# Patient Record
Sex: Male | Born: 1945
Health system: Southern US, Community
[De-identification: ages and names within clinical notes are randomized; demographics above are authoritative.]

## PROBLEM LIST (undated history)

## (undated) DIAGNOSIS — I10 Essential (primary) hypertension: Secondary | ICD-10-CM

## (undated) DIAGNOSIS — N189 Chronic kidney disease, unspecified: Secondary | ICD-10-CM

## (undated) DIAGNOSIS — E785 Hyperlipidemia, unspecified: Secondary | ICD-10-CM

## (undated) DIAGNOSIS — E13319 Other specified diabetes mellitus with unspecified diabetic retinopathy without macular edema: Secondary | ICD-10-CM

## (undated) HISTORY — DX: Chronic kidney disease, unspecified: N18.9

## (undated) HISTORY — DX: Essential (primary) hypertension: I10

## (undated) HISTORY — DX: Hyperlipidemia, unspecified: E78.5

## (undated) HISTORY — DX: Other specified diabetes mellitus with unspecified diabetic retinopathy without macular edema: E13.319

---

## 2005-05-29 ENCOUNTER — Ambulatory Visit: Payer: Self-pay | Admitting: Internal Medicine

## 2008-11-20 ENCOUNTER — Ambulatory Visit: Payer: Self-pay | Admitting: Nephrology

## 2014-09-04 ENCOUNTER — Encounter (INDEPENDENT_AMBULATORY_CARE_PROVIDER_SITE_OTHER): Payer: Self-pay | Admitting: Ophthalmology

## 2014-09-12 ENCOUNTER — Encounter (INDEPENDENT_AMBULATORY_CARE_PROVIDER_SITE_OTHER): Payer: Medicare Other | Admitting: Ophthalmology

## 2014-09-12 DIAGNOSIS — H35033 Hypertensive retinopathy, bilateral: Secondary | ICD-10-CM

## 2014-09-12 DIAGNOSIS — E11339 Type 2 diabetes mellitus with moderate nonproliferative diabetic retinopathy without macular edema: Secondary | ICD-10-CM

## 2014-09-12 DIAGNOSIS — I10 Essential (primary) hypertension: Secondary | ICD-10-CM

## 2014-09-12 DIAGNOSIS — E11331 Type 2 diabetes mellitus with moderate nonproliferative diabetic retinopathy with macular edema: Secondary | ICD-10-CM

## 2014-09-12 DIAGNOSIS — H43813 Vitreous degeneration, bilateral: Secondary | ICD-10-CM

## 2014-09-12 DIAGNOSIS — E11311 Type 2 diabetes mellitus with unspecified diabetic retinopathy with macular edema: Secondary | ICD-10-CM

## 2014-10-31 DIAGNOSIS — M1812 Unilateral primary osteoarthritis of first carpometacarpal joint, left hand: Secondary | ICD-10-CM | POA: Diagnosis not present

## 2014-11-07 ENCOUNTER — Ambulatory Visit: Payer: Self-pay | Admitting: Unknown Physician Specialty

## 2014-11-07 DIAGNOSIS — Z01810 Encounter for preprocedural cardiovascular examination: Secondary | ICD-10-CM | POA: Diagnosis not present

## 2014-11-07 DIAGNOSIS — E119 Type 2 diabetes mellitus without complications: Secondary | ICD-10-CM | POA: Diagnosis not present

## 2014-11-07 DIAGNOSIS — Z01812 Encounter for preprocedural laboratory examination: Secondary | ICD-10-CM | POA: Diagnosis not present

## 2014-11-07 DIAGNOSIS — I10 Essential (primary) hypertension: Secondary | ICD-10-CM | POA: Diagnosis not present

## 2014-11-10 DIAGNOSIS — R0789 Other chest pain: Secondary | ICD-10-CM | POA: Diagnosis not present

## 2014-11-10 DIAGNOSIS — Z01811 Encounter for preprocedural respiratory examination: Secondary | ICD-10-CM | POA: Diagnosis not present

## 2014-11-14 ENCOUNTER — Ambulatory Visit: Payer: Self-pay | Admitting: Specialist

## 2014-11-14 DIAGNOSIS — M1990 Unspecified osteoarthritis, unspecified site: Secondary | ICD-10-CM | POA: Diagnosis not present

## 2014-11-14 DIAGNOSIS — M1812 Unilateral primary osteoarthritis of first carpometacarpal joint, left hand: Secondary | ICD-10-CM | POA: Diagnosis not present

## 2014-11-14 DIAGNOSIS — I10 Essential (primary) hypertension: Secondary | ICD-10-CM | POA: Diagnosis not present

## 2014-11-14 DIAGNOSIS — Z91048 Other nonmedicinal substance allergy status: Secondary | ICD-10-CM | POA: Diagnosis not present

## 2014-11-14 DIAGNOSIS — E119 Type 2 diabetes mellitus without complications: Secondary | ICD-10-CM | POA: Diagnosis not present

## 2014-11-14 DIAGNOSIS — M19042 Primary osteoarthritis, left hand: Secondary | ICD-10-CM | POA: Diagnosis not present

## 2014-11-14 DIAGNOSIS — N289 Disorder of kidney and ureter, unspecified: Secondary | ICD-10-CM | POA: Diagnosis not present

## 2014-11-14 HISTORY — PX: OTHER SURGICAL HISTORY: SHX169

## 2014-11-21 DIAGNOSIS — M1812 Unilateral primary osteoarthritis of first carpometacarpal joint, left hand: Secondary | ICD-10-CM | POA: Diagnosis not present

## 2014-12-07 DIAGNOSIS — E1122 Type 2 diabetes mellitus with diabetic chronic kidney disease: Secondary | ICD-10-CM | POA: Diagnosis not present

## 2014-12-07 DIAGNOSIS — I129 Hypertensive chronic kidney disease with stage 1 through stage 4 chronic kidney disease, or unspecified chronic kidney disease: Secondary | ICD-10-CM | POA: Diagnosis not present

## 2014-12-07 DIAGNOSIS — I10 Essential (primary) hypertension: Secondary | ICD-10-CM | POA: Diagnosis not present

## 2014-12-07 DIAGNOSIS — E782 Mixed hyperlipidemia: Secondary | ICD-10-CM | POA: Diagnosis not present

## 2014-12-14 DIAGNOSIS — M1812 Unilateral primary osteoarthritis of first carpometacarpal joint, left hand: Secondary | ICD-10-CM | POA: Diagnosis not present

## 2014-12-18 ENCOUNTER — Encounter (INDEPENDENT_AMBULATORY_CARE_PROVIDER_SITE_OTHER): Payer: Medicare Other | Admitting: Ophthalmology

## 2015-01-14 NOTE — Op Note (Signed)
PATIENT NAME:  Jack Estrada, Jack Estrada MR#:  [id number] DATE OF BIRTH:  09-09-1946  DATE OF PROCEDURE:  11/14/2014  PREOPERATIVE DIAGNOSIS: Severe degenerative arthritis, carpometacarpal joint, base of left thumb.   POSTOPERATIVE DIAGNOSIS: Severe degenerative arthritis, carpometacarpal joint, base of left thumb.   PROCEDURE PERFORMED: Excisional trapezium arthroplasty, left thumb.   SURGEON: Christophe Louis, M.D.   ANESTHESIA: General.   COMPLICATIONS: None.   TOURNIQUET TIME: 58 minutes.   DESCRIPTION OF PROCEDURE: 2 grams of Ancef was given intravenously prior to the procedure. General anesthesia is induced. The left upper extremity is thoroughly prepped with alcohol and ChloraPrep and draped in standard sterile fashion. The extremity is wrapped out with the Esmarch bandage and pneumatic tourniquet elevated to 250 mmHg. Median nerve block is first performed at the wrist using 0.5% plain Marcaine. Under loupe magnification, a standard dorsal incision is then made over the carpometacarpal joint at the base of the left thumb. The dissection is carefully carried down with preservation of the tendons and cutaneous nerves. The dorsal capsule is carefully dissected out and a longitudinal incision is made in the dorsal capsule and it is carefully reflected to each side for later repair. The trapezium was dissected out and then is completely excised using the bur and the rongeur. Careful palpation demonstrates no residual pieces of bone. Three small pieces of Gelfoam are then fashioned then compressed into the shape of a trapezium and sewn together using 4-0 Mersilene. This was then entered into the hole left by the excision of the trapezium and secured at the base of the wound by the 4-0 Mersilene through a suture through the flexor tendon. Subcutaneous tissue was then infiltrated with 0.5% plain Marcaine. Dorsal capsule is then meticulously repaired using 4-0 Mersilene. One 5-0 Vicryl suture is placed and then  a running subcuticular 3-0 Prolene for the skin. Soft bulky dressing with a thumb spica splint is applied and the tourniquet is released. The patient is returned to the recovery room having tolerated the procedure quite well.   ____________________________ Lucas Mallow, MD ces:mc D: 11/15/2014 08:06:55 ET T: 11/15/2014 10:38:49 ET JOB#: [id number]  cc: Lucas Mallow, MD, <Dictator> Lucas Mallow MD ELECTRONICALLY SIGNED 11/25/2014 10:06

## 2015-02-16 ENCOUNTER — Telehealth: Payer: Self-pay

## 2015-02-16 DIAGNOSIS — I10 Essential (primary) hypertension: Secondary | ICD-10-CM

## 2015-02-16 DIAGNOSIS — E785 Hyperlipidemia, unspecified: Secondary | ICD-10-CM

## 2015-02-16 MED ORDER — LOSARTAN POTASSIUM 100 MG PO TABS: 100.0000 mg | ORAL_TABLET | Freq: Every day | ORAL | Status: DC

## 2015-02-16 MED ORDER — ATORVASTATIN CALCIUM 40 MG PO TABS: 40.0000 mg | ORAL_TABLET | Freq: Every day | ORAL | Status: DC

## 2015-02-16 NOTE — Telephone Encounter (Signed)
meds done

## 2015-02-16 NOTE — Telephone Encounter (Signed)
[facility] is requesting 90 day Rx for Atorvastatin 40mg  Tab And Losartan Pot 100mg 

## 2015-02-23 ENCOUNTER — Telehealth: Payer: Self-pay | Admitting: Family Medicine

## 2015-02-23 ENCOUNTER — Telehealth: Payer: Self-pay

## 2015-02-23 NOTE — Telephone Encounter (Signed)
Left message for patient to call back so that I can discuss his Lantus dose with him.

## 2015-02-23 NOTE — Telephone Encounter (Signed)
Pt called stated pharmacy is not giving pt the correct dosage of Lantus. Pt states he needs a RX for 5 pens per month but pharmacy is only willing to give 3 pens at this point. 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 Thanks

## 2015-02-23 NOTE — Telephone Encounter (Signed)
Pharmacy called, they do not have a new prescription for him for the Lantus. Therapist, nutritional, he had a current one on file, so I gave a verbal over the phone.

## 2015-02-23 NOTE — Telephone Encounter (Signed)
Patient called back, he was concerned that there was not enough Lantus prescribed. I checked the prescription in [facility], there is enough to get him through a month.

## 2015-05-08 ENCOUNTER — Other Ambulatory Visit: Payer: Self-pay | Admitting: Family Medicine

## 2015-05-10 DIAGNOSIS — M1812 Unilateral primary osteoarthritis of first carpometacarpal joint, left hand: Secondary | ICD-10-CM | POA: Diagnosis not present

## 2015-06-20 ENCOUNTER — Telehealth: Payer: Self-pay | Admitting: Family Medicine

## 2015-06-20 NOTE — Telephone Encounter (Signed)
Pt called wanted to know if we have samples of Lantus. Pt stated he is in a donut hole and can't afford $800 for the RX. Please call pt. Thanks.

## 2015-07-06 DIAGNOSIS — E113299 Type 2 diabetes mellitus with mild nonproliferative diabetic retinopathy without macular edema, unspecified eye: Secondary | ICD-10-CM | POA: Insufficient documentation

## 2015-07-06 DIAGNOSIS — E119 Type 2 diabetes mellitus without complications: Secondary | ICD-10-CM | POA: Insufficient documentation

## 2015-07-06 DIAGNOSIS — E1169 Type 2 diabetes mellitus with other specified complication: Secondary | ICD-10-CM | POA: Insufficient documentation

## 2015-07-06 DIAGNOSIS — E13319 Other specified diabetes mellitus with unspecified diabetic retinopathy without macular edema: Secondary | ICD-10-CM | POA: Insufficient documentation

## 2015-07-06 DIAGNOSIS — E785 Hyperlipidemia, unspecified: Secondary | ICD-10-CM | POA: Insufficient documentation

## 2015-07-06 DIAGNOSIS — Z794 Long term (current) use of insulin: Secondary | ICD-10-CM | POA: Insufficient documentation

## 2015-07-09 ENCOUNTER — Encounter: Payer: Self-pay | Admitting: Family Medicine

## 2015-07-09 ENCOUNTER — Telehealth: Payer: Self-pay

## 2015-07-09 ENCOUNTER — Ambulatory Visit (INDEPENDENT_AMBULATORY_CARE_PROVIDER_SITE_OTHER): Payer: Medicare Other | Admitting: Family Medicine

## 2015-07-09 VITALS — BP 119/74 | HR 67 | Temp 97.2°F | Ht 71.5 in | Wt 244.0 lb

## 2015-07-09 DIAGNOSIS — E113299 Type 2 diabetes mellitus with mild nonproliferative diabetic retinopathy without macular edema, unspecified eye: Secondary | ICD-10-CM

## 2015-07-09 DIAGNOSIS — Z23 Encounter for immunization: Secondary | ICD-10-CM

## 2015-07-09 DIAGNOSIS — I10 Essential (primary) hypertension: Secondary | ICD-10-CM | POA: Insufficient documentation

## 2015-07-09 DIAGNOSIS — Z0000 Encounter for general adult medical examination without abnormal findings: Secondary | ICD-10-CM

## 2015-07-09 DIAGNOSIS — E119 Type 2 diabetes mellitus without complications: Secondary | ICD-10-CM | POA: Diagnosis not present

## 2015-07-09 DIAGNOSIS — N400 Enlarged prostate without lower urinary tract symptoms: Secondary | ICD-10-CM | POA: Diagnosis not present

## 2015-07-09 DIAGNOSIS — Z794 Long term (current) use of insulin: Secondary | ICD-10-CM

## 2015-07-09 DIAGNOSIS — E785 Hyperlipidemia, unspecified: Secondary | ICD-10-CM | POA: Diagnosis not present

## 2015-07-09 DIAGNOSIS — E1159 Type 2 diabetes mellitus with other circulatory complications: Secondary | ICD-10-CM | POA: Insufficient documentation

## 2015-07-09 DIAGNOSIS — Z1211 Encounter for screening for malignant neoplasm of colon: Secondary | ICD-10-CM

## 2015-07-09 LAB — URINALYSIS, ROUTINE W REFLEX MICROSCOPIC
BILIRUBIN UA: NEGATIVE
GLUCOSE, UA: NEGATIVE
Leukocytes, UA: NEGATIVE
NITRITE UA: NEGATIVE
RBC UA: NEGATIVE
SPEC GRAV UA: 1.025 (ref 1.005–1.030)
UUROB: 1 mg/dL (ref 0.2–1.0)
pH, UA: 5.5 (ref 5.0–7.5)

## 2015-07-09 LAB — BAYER DCA HB A1C WAIVED: HB A1C: 8.6 % — AB (ref <7.0)

## 2015-07-09 LAB — MICROSCOPIC EXAMINATION
Epithelial Cells (non renal): NONE SEEN /hpf (ref 0–10)
RBC, UA: NONE SEEN /hpf (ref 0–?)
WBC, UA: NONE SEEN /hpf (ref 0–?)

## 2015-07-09 MED ORDER — LOSARTAN POTASSIUM 100 MG PO TABS: 100.0000 mg | ORAL_TABLET | Freq: Every day | ORAL | Status: DC

## 2015-07-09 MED ORDER — INSULIN LISPRO 100 UNIT/ML (KWIKPEN): 20.0000 [IU] | PEN_INJECTOR | Freq: Three times a day (TID) | SUBCUTANEOUS | Status: DC

## 2015-07-09 MED ORDER — AMLODIPINE BESYLATE 10 MG PO TABS: 10.0000 mg | ORAL_TABLET | Freq: Every day | ORAL | Status: DC

## 2015-07-09 MED ORDER — ATORVASTATIN CALCIUM 40 MG PO TABS: 40.0000 mg | ORAL_TABLET | Freq: Every day | ORAL | Status: DC

## 2015-07-09 MED ORDER — INSULIN GLARGINE 100 UNIT/ML SOLOSTAR PEN: 50.0000 [IU] | PEN_INJECTOR | Freq: Every morning | SUBCUTANEOUS | Status: DC

## 2015-07-09 NOTE — Telephone Encounter (Signed)
Pharmacy notified to cancel amlodipine, lipitor, and losartan.

## 2015-07-09 NOTE — Assessment & Plan Note (Signed)
The current medical regimen is effective;  continue present plan and medications.  

## 2015-07-09 NOTE — Progress Notes (Signed)
BP 119/74 mmHg  Pulse 67  Temp(Src) 97.2 F (36.2 C)  Ht 5' 11.5" (1.816 m)  Wt 244 lb (110.678 kg)  BMI 33.56 kg/m2  SpO2 98%   Subjective:    Patient ID: Jack Estrada, male    DOB: 09/11/1946, 69 y.o.   MRN: [id number]  HPI: Jack Estrada is a 69 y.o. male  Chief Complaint  Patient presents with  . Annual Exam   patient recheck blood pressures doing well with no complaints from medications same with cholesterol medicines no complaints taking faithfully. Insulin has run out of Lantus and Humalog has been cutting back on dose the last 2 weeks is entering the doughnut hole. Sugars been okay on home monitoring Otherwise doing well no complaints annual wellness visit Metrix met  Relevant past medical, surgical, family and social history reviewed and updated as indicated. Interim medical history since our last visit reviewed. Allergies and medications reviewed and updated.  Review of Systems  Constitutional: Negative.   HENT: Negative.   Eyes: Negative.   Respiratory: Negative.   Cardiovascular: Negative.   Gastrointestinal: Negative.   Endocrine: Negative.   Genitourinary: Negative.   Musculoskeletal: Negative.   Skin: Negative.   Allergic/Immunologic: Negative.   Neurological: Negative.   Hematological: Negative.   Psychiatric/Behavioral: Negative.     Per HPI unless specifically indicated above     Objective:    BP 119/74 mmHg  Pulse 67  Temp(Src) 97.2 F (36.2 C)  Ht 5' 11.5" (1.816 m)  Wt 244 lb (110.678 kg)  BMI 33.56 kg/m2  SpO2 98%  Wt Readings from Last 3 Encounters:  07/09/15 244 lb (110.678 kg)  12/07/14 248 lb (112.492 kg)    Physical Exam  Constitutional: He is oriented to person, place, and time. He appears well-developed and well-nourished.  HENT:  Head: Normocephalic and atraumatic.  Right Ear: External ear normal.  Left Ear: External ear normal.  Eyes: Conjunctivae and EOM are normal. Pupils are equal, round, and reactive to light.   Neck: Normal range of motion. Neck supple.  Cardiovascular: Normal rate, regular rhythm, normal heart sounds and intact distal pulses.   Pulmonary/Chest: Effort normal and breath sounds normal.  Abdominal: Soft. Bowel sounds are normal. There is no splenomegaly or hepatomegaly.  Genitourinary: Rectum normal and penis normal.  Prostate enlarged  Musculoskeletal: Normal range of motion.  Neurological: He is alert and oriented to person, place, and time. He has normal reflexes.  Skin: No rash noted. No erythema.  Psychiatric: He has a normal mood and affect. His behavior is normal. Judgment and thought content normal.    No results found for this or any previous visit.    Assessment & Plan:   Problem List Items Addressed This Visit      Cardiovascular and Mediastinum   Essential hypertension    The current medical regimen is effective;  continue present plan and medications.       Relevant Medications   amLODipine (NORVASC) 10 MG tablet   atorvastatin (LIPITOR) 40 MG tablet   losartan (COZAAR) 100 MG tablet   Other Relevant Orders   Comprehensive metabolic panel   CBC with Differential/Platelet   Urinalysis, Routine w reflex microscopic (not at [facility])   TSH     Endocrine   Type 2 DM mild nonproliferative retinopathy, no macular edema, control (HCC)    Discussed risk of uncontrolled diabetes and aging and dog years. Discuss use of insulin using samples is available and other medications.  Relevant Medications   atorvastatin (LIPITOR) 40 MG tablet   Insulin Glargine (LANTUS SOLOSTAR) 100 UNIT/ML Solostar Pen   insulin lispro (HUMALOG KWIKPEN) 100 UNIT/ML KiwkPen   losartan (COZAAR) 100 MG tablet     Genitourinary   BPH (benign prostatic hyperplasia)   Relevant Orders   PSA   TSH     Other   Hyperlipidemia    The current medical regimen is effective;  continue present plan and medications.       Relevant Medications   amLODipine (NORVASC) 10 MG tablet    atorvastatin (LIPITOR) 40 MG tablet   losartan (COZAAR) 100 MG tablet   Other Relevant Orders   Comprehensive metabolic panel   Lipid panel   CBC with Differential/Platelet   Urinalysis, Routine w reflex microscopic (not at [facility])   TSH    Other Visit Diagnoses    Immunization due    -  Primary    Relevant Orders    Flu Vaccine QUAD 36+ mos PF IM (Fluarix & Fluzone Quad PF) (Completed)    Colon cancer screening        Relevant Orders    Ambulatory referral to General Surgery    PE (physical exam), annual        Benign hypertension        Relevant Medications    amLODipine (NORVASC) 10 MG tablet    atorvastatin (LIPITOR) 40 MG tablet    losartan (COZAAR) 100 MG tablet        Follow up plan: Return in about 3 months (around 10/09/2015), or if symptoms worsen or fail to improve, for Recheck diabetes and hemoglobin A1c.

## 2015-07-09 NOTE — Assessment & Plan Note (Signed)
Discussed risk of uncontrolled diabetes and aging and dog years. Discuss use of insulin using samples is available and other medications.

## 2015-07-10 ENCOUNTER — Encounter: Payer: Self-pay | Admitting: Family Medicine

## 2015-07-10 LAB — CBC WITH DIFFERENTIAL/PLATELET
Basophils Absolute: 0 10*3/uL (ref 0.0–0.2)
Basos: 1 %
EOS (ABSOLUTE): 0.2 10*3/uL (ref 0.0–0.4)
EOS: 4 %
HEMATOCRIT: 38.9 % (ref 37.5–51.0)
HEMOGLOBIN: 12.7 g/dL (ref 12.6–17.7)
Immature Grans (Abs): 0 10*3/uL (ref 0.0–0.1)
Immature Granulocytes: 0 %
LYMPHS ABS: 2.1 10*3/uL (ref 0.7–3.1)
Lymphs: 34 %
MCH: 29.1 pg (ref 26.6–33.0)
MCHC: 32.6 g/dL (ref 31.5–35.7)
MCV: 89 fL (ref 79–97)
MONOCYTES: 8 %
Monocytes Absolute: 0.5 10*3/uL (ref 0.1–0.9)
NEUTROS ABS: 3.3 10*3/uL (ref 1.4–7.0)
Neutrophils: 53 %
Platelets: 174 10*3/uL (ref 150–379)
RBC: 4.36 x10E6/uL (ref 4.14–5.80)
RDW: 13.7 % (ref 12.3–15.4)
WBC: 6.2 10*3/uL (ref 3.4–10.8)

## 2015-07-10 LAB — TSH: TSH: 2.5 u[IU]/mL (ref 0.450–4.500)

## 2015-07-10 LAB — COMPREHENSIVE METABOLIC PANEL
A/G RATIO: 1.5 (ref 1.1–2.5)
ALK PHOS: 85 IU/L (ref 39–117)
ALT: 31 IU/L (ref 0–44)
AST: 20 IU/L (ref 0–40)
Albumin: 4.1 g/dL (ref 3.6–4.8)
BILIRUBIN TOTAL: 0.3 mg/dL (ref 0.0–1.2)
BUN/Creatinine Ratio: 18 (ref 10–22)
BUN: 24 mg/dL (ref 8–27)
CHLORIDE: 103 mmol/L (ref 97–106)
CO2: 25 mmol/L (ref 18–29)
Calcium: 9.6 mg/dL (ref 8.6–10.2)
Creatinine, Ser: 1.3 mg/dL — ABNORMAL HIGH (ref 0.76–1.27)
GFR calc Af Amer: 65 mL/min/{1.73_m2} (ref >59)
GFR calc non Af Amer: 56 mL/min/{1.73_m2} — ABNORMAL LOW (ref >59)
GLUCOSE: 107 mg/dL — AB (ref 65–99)
Globulin, Total: 2.8 g/dL (ref 1.5–4.5)
POTASSIUM: 4.5 mmol/L (ref 3.5–5.2)
Sodium: 142 mmol/L (ref 136–144)
Total Protein: 6.9 g/dL (ref 6.0–8.5)

## 2015-07-10 LAB — LIPID PANEL
CHOL/HDL RATIO: 4.8 ratio (ref 0.0–5.0)
CHOLESTEROL TOTAL: 163 mg/dL (ref 100–199)
HDL: 34 mg/dL — AB (ref >39)
LDL Calculated: 88 mg/dL (ref 0–99)
Triglycerides: 204 mg/dL — ABNORMAL HIGH (ref 0–149)
VLDL Cholesterol Cal: 41 mg/dL — ABNORMAL HIGH (ref 5–40)

## 2015-07-10 LAB — PSA: PROSTATE SPECIFIC AG, SERUM: 0.5 ng/mL (ref 0.0–4.0)

## 2015-08-22 DIAGNOSIS — E113293 Type 2 diabetes mellitus with mild nonproliferative diabetic retinopathy without macular edema, bilateral: Secondary | ICD-10-CM | POA: Diagnosis not present

## 2015-08-22 LAB — HM DIABETES EYE EXAM

## 2015-09-04 ENCOUNTER — Telehealth: Payer: Self-pay | Admitting: Family Medicine

## 2015-09-04 NOTE — Telephone Encounter (Signed)
Pt notified   thanks

## 2015-09-04 NOTE — Telephone Encounter (Signed)
Pt would like to know if he could poss have humalog and possibly lantus. He stated once he got to January he should be ok.

## 2015-09-04 NOTE — Telephone Encounter (Signed)
Please call patient, samples are in the fridge

## 2015-09-14 ENCOUNTER — Other Ambulatory Visit: Payer: Self-pay | Admitting: Family Medicine

## 2015-09-14 ENCOUNTER — Telehealth: Payer: Self-pay | Admitting: Family Medicine

## 2015-09-14 NOTE — Telephone Encounter (Signed)
Sample with patient's name in refridgerator

## 2015-09-14 NOTE — Telephone Encounter (Signed)
Pt called wants to know if he can have one more sample of Humalog. Pt stated he needs one more to get him through the donut hole with his insurance. Thanks.

## 2015-10-15 ENCOUNTER — Ambulatory Visit: Payer: Medicare Other | Admitting: Family Medicine

## 2015-10-15 ENCOUNTER — Encounter: Payer: Self-pay | Admitting: Family Medicine

## 2015-10-16 ENCOUNTER — Other Ambulatory Visit: Payer: Self-pay | Admitting: Family Medicine

## 2015-10-16 NOTE — Telephone Encounter (Signed)
Your patient 

## 2015-11-05 ENCOUNTER — Ambulatory Visit: Payer: Medicare Other | Admitting: Family Medicine

## 2015-11-08 ENCOUNTER — Telehealth: Payer: Self-pay | Admitting: Family Medicine

## 2015-11-08 MED ORDER — TADALAFIL 20 MG PO TABS: 10.0000 mg | ORAL_TABLET | ORAL | Status: DC | PRN

## 2015-11-08 NOTE — Telephone Encounter (Signed)
Call pt Patient needs refill on his medication Cialis send to [facility]. Please call patient when it is done, thanks [phone].

## 2015-11-08 NOTE — Telephone Encounter (Signed)
[place] [address]  Patient needs refill on his medication Cialis send to [facility]. Please call patient when it is done, thanks [phone].

## 2015-11-15 ENCOUNTER — Ambulatory Visit (INDEPENDENT_AMBULATORY_CARE_PROVIDER_SITE_OTHER): Payer: Medicare Other | Admitting: Family Medicine

## 2015-11-15 ENCOUNTER — Encounter: Payer: Self-pay | Admitting: Family Medicine

## 2015-11-15 VITALS — BP 138/74 | HR 61 | Temp 97.8°F | Ht 72.3 in | Wt 244.0 lb

## 2015-11-15 DIAGNOSIS — I10 Essential (primary) hypertension: Secondary | ICD-10-CM | POA: Diagnosis not present

## 2015-11-15 DIAGNOSIS — Z794 Long term (current) use of insulin: Secondary | ICD-10-CM | POA: Diagnosis not present

## 2015-11-15 DIAGNOSIS — E113299 Type 2 diabetes mellitus with mild nonproliferative diabetic retinopathy without macular edema, unspecified eye: Secondary | ICD-10-CM

## 2015-11-15 LAB — BAYER DCA HB A1C WAIVED: HB A1C: 7.4 % — AB (ref <7.0)

## 2015-11-15 MED ORDER — AMLODIPINE BESYLATE 10 MG PO TABS: 10.0000 mg | ORAL_TABLET | Freq: Every day | ORAL | Status: DC

## 2015-11-15 MED ORDER — ATORVASTATIN CALCIUM 40 MG PO TABS: 40.0000 mg | ORAL_TABLET | Freq: Every day | ORAL | Status: DC

## 2015-11-15 NOTE — Assessment & Plan Note (Signed)
Diabetes much improved we will continue good work and working at getting hemoglobin A1c less than 7.

## 2015-11-15 NOTE — Assessment & Plan Note (Signed)
The current medical regimen is effective;  continue present plan and medications.  

## 2015-11-15 NOTE — Progress Notes (Signed)
BP 138/74 mmHg  Pulse 61  Temp(Src) 97.8 F (36.6 C)  Ht 6' 0.3" (1.836 m)  Wt 244 lb (110.678 kg)  BMI 32.83 kg/m2  SpO2 97%   Subjective:    Patient ID: [name], male    DOB: Aug 28, 1946, 70 y.o.   MRN: [id number]  HPI: CARLOS UPADHYAYA is a 70 y.o. male  Chief Complaint  Patient presents with  . Diabetes   patient's been working very hard to get blood sugar down which is dropped 1.. Patient doing well with other medications no complaints blood pressure good control cholesterol doing well Doing well with insulin with no low blood sugar spells. Taking medications faithfully without side effects  Relevant past medical, surgical, family and social history reviewed and updated as indicated. Interim medical history since our last visit reviewed. Allergies and medications reviewed and updated.  Review of Systems  Constitutional: Negative.   Respiratory: Negative.   Cardiovascular: Negative.     Per HPI unless specifically indicated above     Objective:    BP 138/74 mmHg  Pulse 61  Temp(Src) 97.8 F (36.6 C)  Ht 6' 0.3" (1.836 m)  Wt 244 lb (110.678 kg)  BMI 32.83 kg/m2  SpO2 97%  Wt Readings from Last 3 Encounters:  11/15/15 244 lb (110.678 kg)  07/09/15 244 lb (110.678 kg)  12/07/14 248 lb (112.492 kg)    Physical Exam  Constitutional: He is oriented to person, place, and time. He appears well-developed and well-nourished. No distress.  HENT:  Head: Normocephalic and atraumatic.  Right Ear: Hearing normal.  Left Ear: Hearing normal.  Nose: Nose normal.  Eyes: Conjunctivae and lids are normal. Right eye exhibits no discharge. Left eye exhibits no discharge. No scleral icterus.  Cardiovascular: Normal rate, regular rhythm and normal heart sounds.   Pulmonary/Chest: Effort normal and breath sounds normal. No respiratory distress.  Musculoskeletal: Normal range of motion.  Neurological: He is alert and oriented to person, place, and time.  Skin: Skin is  intact. No rash noted.  Psychiatric: He has a normal mood and affect. His speech is normal and behavior is normal. Judgment and thought content normal. Cognition and memory are normal.    Results for orders placed or performed in visit on 07/09/15  Microscopic Examination  Result Value Ref Range   WBC, UA None seen 0 -  5 /hpf   RBC, UA None seen 0 -  2 /hpf   Epithelial Cells (non renal) None seen 0 - 10 /hpf  Comprehensive metabolic panel  Result Value Ref Range   Glucose 107 (H) 65 - 99 mg/dL   BUN 24 8 - 27 mg/dL   Creatinine, Ser 1.30 (H) 0.76 - 1.27 mg/dL   GFR calc non Af Amer 56 (L) >59 mL/min/1.73   GFR calc Af Amer 65 >59 mL/min/1.73   BUN/Creatinine Ratio 18 10 - 22   Sodium 142 136 - 144 mmol/L   Potassium 4.5 3.5 - 5.2 mmol/L   Chloride 103 97 - 106 mmol/L   CO2 25 18 - 29 mmol/L   Calcium 9.6 8.6 - 10.2 mg/dL   Total Protein 6.9 6.0 - 8.5 g/dL   Albumin 4.1 3.6 - 4.8 g/dL   Globulin, Total 2.8 1.5 - 4.5 g/dL   Albumin/Globulin Ratio 1.5 1.1 - 2.5   Bilirubin Total 0.3 0.0 - 1.2 mg/dL   Alkaline Phosphatase 85 39 - 117 IU/L   AST 20 0 - 40 IU/L   ALT  31 0 - 44 IU/L  Lipid panel  Result Value Ref Range   Cholesterol, Total 163 100 - 199 mg/dL   Triglycerides 204 (H) 0 - 149 mg/dL   HDL 34 (L) >39 mg/dL   VLDL Cholesterol Cal 41 (H) 5 - 40 mg/dL   LDL Calculated 88 0 - 99 mg/dL   Chol/HDL Ratio 4.8 0.0 - 5.0 ratio units  CBC with Differential/Platelet  Result Value Ref Range   WBC 6.2 3.4 - 10.8 x10E3/uL   RBC 4.36 4.14 - 5.80 x10E6/uL   Hemoglobin 12.7 12.6 - 17.7 g/dL   Hematocrit 38.9 37.5 - 51.0 %   MCV 89 79 - 97 fL   MCH 29.1 26.6 - 33.0 pg   MCHC 32.6 31.5 - 35.7 g/dL   RDW 13.7 12.3 - 15.4 %   Platelets 174 150 - 379 x10E3/uL   Neutrophils 53 %   Lymphs 34 %   Monocytes 8 %   Eos 4 %   Basos 1 %   Neutrophils Absolute 3.3 1.4 - 7.0 x10E3/uL   Lymphocytes Absolute 2.1 0.7 - 3.1 x10E3/uL   Monocytes Absolute 0.5 0.1 - 0.9 x10E3/uL   EOS  (ABSOLUTE) 0.2 0.0 - 0.4 x10E3/uL   Basophils Absolute 0.0 0.0 - 0.2 x10E3/uL   Immature Granulocytes 0 %   Immature Grans (Abs) 0.0 0.0 - 0.1 x10E3/uL  PSA  Result Value Ref Range   Prostate Specific Ag, Serum 0.5 0.0 - 4.0 ng/mL  Urinalysis, Routine w reflex microscopic (not at [facility])  Result Value Ref Range   Specific Gravity, UA 1.025 1.005 - 1.030   pH, UA 5.5 5.0 - 7.5   Color, UA Yellow Yellow   Appearance Ur Clear Clear   Leukocytes, UA Negative Negative   Protein, UA Trace (A) Negative/Trace   Glucose, UA Negative Negative   Ketones, UA Trace (A) Negative   RBC, UA Negative Negative   Bilirubin, UA Negative Negative   Urobilinogen, Ur 1.0 0.2 - 1.0 mg/dL   Nitrite, UA Negative Negative   Microscopic Examination See below:   TSH  Result Value Ref Range   TSH 2.500 0.450 - 4.500 uIU/mL  Bayer DCA Hb A1c Waived  Result Value Ref Range   Bayer DCA Hb A1c Waived 8.6 (H) <7.0 %      Assessment & Plan:   Problem List Items Addressed This Visit      Cardiovascular and Mediastinum   Essential hypertension    The current medical regimen is effective;  continue present plan and medications.       Relevant Medications   atorvastatin (LIPITOR) 40 MG tablet   amLODipine (NORVASC) 10 MG tablet     Endocrine   Type 2 DM mild nonproliferative retinopathy, no macular edema, control (HCC) - Primary    Diabetes much improved we will continue good work and working at getting hemoglobin A1c less than 7.      Relevant Medications   atorvastatin (LIPITOR) 40 MG tablet   Other Relevant Orders   Bayer DCA Hb A1c Waived       Follow up plan: Return in about 3 months (around 02/15/2016) for a1c.

## 2016-01-15 ENCOUNTER — Ambulatory Visit (INDEPENDENT_AMBULATORY_CARE_PROVIDER_SITE_OTHER): Payer: Medicare Other | Admitting: Family Medicine

## 2016-01-15 ENCOUNTER — Encounter: Payer: Self-pay | Admitting: Family Medicine

## 2016-01-15 VITALS — BP 123/68 | HR 65 | Temp 98.2°F | Ht 71.4 in | Wt 211.0 lb

## 2016-01-15 DIAGNOSIS — R638 Other symptoms and signs concerning food and fluid intake: Secondary | ICD-10-CM

## 2016-01-15 LAB — MICROSCOPIC EXAMINATION
EPITHELIAL CELLS (NON RENAL): NONE SEEN /HPF (ref 0–10)
RBC MICROSCOPIC, UA: NONE SEEN /HPF (ref 0–?)

## 2016-01-15 LAB — URINALYSIS, ROUTINE W REFLEX MICROSCOPIC
Bilirubin, UA: NEGATIVE
Glucose, UA: NEGATIVE
Ketones, UA: NEGATIVE
Leukocytes, UA: NEGATIVE
NITRITE UA: NEGATIVE
PH UA: 5 (ref 5.0–7.5)
Protein, UA: NEGATIVE
Specific Gravity, UA: 1.015 (ref 1.005–1.030)
UUROB: 0.2 mg/dL (ref 0.2–1.0)

## 2016-01-15 MED ORDER — SILDENAFIL CITRATE 20 MG PO TABS: 20.0000 mg | ORAL_TABLET | Freq: Every day | ORAL | Status: DC | PRN

## 2016-01-15 NOTE — Progress Notes (Signed)
   BP 123/68 mmHg  Pulse 65  Temp(Src) 98.2 F (36.8 C)  Ht 5' 11.4" (1.814 m)  Wt 211 lb (95.709 kg)  BMI 29.09 kg/m2  SpO2 98%   Subjective:    Patient ID: Jack Estrada, male    DOB: 31-Mar-1946, 70 y.o.   MRN: [id number]  HPI: Jack Estrada is a 70 y.o. male  Chief Complaint  Patient presents with  . fever, weakness    has been in [place], was bitten by insect on last Thursday    Patient with resolved fever and some dehydration got hot and weak traveling back on the airplane urine got very dark has been trying to drink fluids And feels somewhat better but had some cramping especially in his right leg. Had some type of bug bite in scrotal area which has resolved but just stung a great deal No further redness or irritation at bite site Patient's been very active and trying to lose weight over the last 2 months with 33 pounds weight loss no low blood sugar spells Relevant past medical, surgical, family and social history reviewed and updated as indicated. Interim medical history since our last visit reviewed. Allergies and medications reviewed and updated.  Review of Systems  Constitutional: Positive for fever, chills, diaphoresis and fatigue.  Respiratory: Negative.   Cardiovascular: Negative.     Per HPI unless specifically indicated above     Objective:    BP 123/68 mmHg  Pulse 65  Temp(Src) 98.2 F (36.8 C)  Ht 5' 11.4" (1.814 m)  Wt 211 lb (95.709 kg)  BMI 29.09 kg/m2  SpO2 98%  Wt Readings from Last 3 Encounters:  01/15/16 211 lb (95.709 kg)  11/15/15 244 lb (110.678 kg)  07/09/15 244 lb (110.678 kg)    Physical Exam  Constitutional: He is oriented to person, place, and time. He appears well-developed and well-nourished. No distress.  HENT:  Head: Normocephalic and atraumatic.  Right Ear: Hearing normal.  Left Ear: Hearing normal.  Nose: Nose normal.  Eyes: Conjunctivae and lids are normal. Right eye exhibits no discharge. Left eye exhibits no discharge. No  scleral icterus.  Cardiovascular: Regular rhythm.   Pulmonary/Chest: Effort normal and breath sounds normal. No respiratory distress.  Musculoskeletal: Normal range of motion.  Neurological: He is alert and oriented to person, place, and time.  Skin: Skin is intact. No rash noted.  Psychiatric: He has a normal mood and affect. His speech is normal and behavior is normal. Judgment and thought content normal. Cognition and memory are normal.    Results for orders placed or performed in visit on 11/15/15  Bayer DCA Hb A1c Waived  Result Value Ref Range   Bayer DCA Hb A1c Waived 7.4 (H) <7.0 %      Assessment & Plan:   Problem List Items Addressed This Visit      Other   Dehydration symptoms - Primary   Relevant Orders   Urinalysis, Routine w reflex microscopic (not at [facility])   Basic metabolic panel      Discussed blood bite observing for further symptoms Follow up plan: Return if symptoms worsen or fail to improve, for As scheduled.

## 2016-01-16 ENCOUNTER — Telehealth: Payer: Self-pay | Admitting: Family Medicine

## 2016-01-16 DIAGNOSIS — N19 Unspecified kidney failure: Secondary | ICD-10-CM

## 2016-01-16 LAB — BASIC METABOLIC PANEL
BUN/Creatinine Ratio: 22 (ref 10–24)
BUN: 41 mg/dL — ABNORMAL HIGH (ref 8–27)
CALCIUM: 9.2 mg/dL (ref 8.6–10.2)
CHLORIDE: 101 mmol/L (ref 96–106)
CO2: 21 mmol/L (ref 18–29)
Creatinine, Ser: 1.87 mg/dL — ABNORMAL HIGH (ref 0.76–1.27)
GFR calc non Af Amer: 36 mL/min/{1.73_m2} — ABNORMAL LOW (ref >59)
GFR, EST AFRICAN AMERICAN: 42 mL/min/{1.73_m2} — AB (ref >59)
Glucose: 189 mg/dL — ABNORMAL HIGH (ref 65–99)
POTASSIUM: 4.4 mmol/L (ref 3.5–5.2)
SODIUM: 140 mmol/L (ref 134–144)

## 2016-01-16 NOTE — Telephone Encounter (Signed)
Phone call Discussed with patient renal function worsening Patient's labs consistent with dehydration fever reported just prior to blood draw. Will recheck BMP 3-4 weeks to assess if improving.

## 2016-02-08 ENCOUNTER — Other Ambulatory Visit: Payer: Self-pay | Admitting: Family Medicine

## 2016-02-08 ENCOUNTER — Ambulatory Visit: Payer: Medicare Other | Admitting: Family Medicine

## 2016-02-08 DIAGNOSIS — R42 Dizziness and giddiness: Secondary | ICD-10-CM

## 2016-02-18 ENCOUNTER — Encounter: Payer: Self-pay | Admitting: Family Medicine

## 2016-02-18 ENCOUNTER — Ambulatory Visit (INDEPENDENT_AMBULATORY_CARE_PROVIDER_SITE_OTHER): Payer: Medicare Other | Admitting: Family Medicine

## 2016-02-18 VITALS — BP 139/69 | HR 61 | Temp 97.8°F | Ht 71.4 in | Wt 206.0 lb

## 2016-02-18 DIAGNOSIS — I10 Essential (primary) hypertension: Secondary | ICD-10-CM | POA: Diagnosis not present

## 2016-02-18 DIAGNOSIS — E119 Type 2 diabetes mellitus without complications: Secondary | ICD-10-CM

## 2016-02-18 DIAGNOSIS — E113299 Type 2 diabetes mellitus with mild nonproliferative diabetic retinopathy without macular edema, unspecified eye: Secondary | ICD-10-CM | POA: Diagnosis not present

## 2016-02-18 DIAGNOSIS — Z794 Long term (current) use of insulin: Secondary | ICD-10-CM

## 2016-02-18 LAB — BAYER DCA HB A1C WAIVED: HB A1C (BAYER DCA - WAIVED): 7.7 % — ABNORMAL HIGH (ref <7.0)

## 2016-02-18 MED ORDER — INSULIN GLARGINE-LIXISENATIDE 100-33 UNT-MCG/ML ~~LOC~~ SOPN: 50.0000 [IU] | PEN_INJECTOR | Freq: Every morning | SUBCUTANEOUS | Status: DC

## 2016-02-18 MED ORDER — AMLODIPINE BESYLATE 5 MG PO TABS: 5.0000 mg | ORAL_TABLET | Freq: Every day | ORAL | Status: DC

## 2016-02-18 NOTE — Progress Notes (Signed)
BP 139/69 mmHg  Pulse 61  Temp(Src) 97.8 F (36.6 C)  Ht 5' 11.4" (1.814 m)  Wt 206 lb (93.441 kg)  BMI 28.40 kg/m2  SpO2 99%   Subjective:    Patient ID: Jack Estrada, male    DOB: 01-22-1946, 70 y.o.   MRN: [id number]  HPI: Jack Estrada is a 70 y.o. male  Chief Complaint  Patient presents with  . Diabetes  . Hypertension    feels because of weight loss BP is getting too low  . Patient states has a lot of stress at home   Patient doing well with weight loss but blood sugars have still been high as had low blood pressure no side effects from medications taken faithfully noted low blood sugar spells. His been adjusting insulin but glucose is still remain high. A lot of family stress but patient is working on this sleeping okay. Relevant past medical, surgical, family and social history reviewed and updated as indicated. Interim medical history since our last visit reviewed. Allergies and medications reviewed and updated.  Review of Systems  Constitutional: Negative.   HENT: Negative.   Eyes: Negative.   Respiratory: Negative.   Cardiovascular: Negative.   Gastrointestinal: Negative.   Endocrine: Negative.   Genitourinary: Negative.   Musculoskeletal: Negative.   Skin: Negative.   Allergic/Immunologic: Negative.   Neurological: Negative.   Hematological: Negative.   Psychiatric/Behavioral: Negative.     Per HPI unless specifically indicated above     Objective:    BP 139/69 mmHg  Pulse 61  Temp(Src) 97.8 F (36.6 C)  Ht 5' 11.4" (1.814 m)  Wt 206 lb (93.441 kg)  BMI 28.40 kg/m2  SpO2 99%  Wt Readings from Last 3 Encounters:  02/18/16 206 lb (93.441 kg)  01/15/16 211 lb (95.709 kg)  11/15/15 244 lb (110.678 kg)    Physical Exam  Constitutional: He is oriented to person, place, and time. He appears well-developed and well-nourished. No distress.  HENT:  Head: Normocephalic and atraumatic.  Right Ear: Hearing normal.  Left Ear: Hearing normal.   Nose: Nose normal.  Eyes: Conjunctivae and lids are normal. Right eye exhibits no discharge. Left eye exhibits no discharge. No scleral icterus.  Cardiovascular: Normal rate, regular rhythm and normal heart sounds.   Pulmonary/Chest: Effort normal and breath sounds normal. No respiratory distress.  Musculoskeletal: Normal range of motion.  Neurological: He is alert and oriented to person, place, and time.  Skin: Skin is intact. No rash noted.  Psychiatric: He has a normal mood and affect. His speech is normal and behavior is normal. Judgment and thought content normal. Cognition and memory are normal.    Results for orders placed or performed in visit on 01/15/16  Microscopic Examination  Result Value Ref Range   WBC, UA 0-5 0 -  5 /hpf   RBC, UA None seen 0 -  2 /hpf   Epithelial Cells (non renal) None seen 0 - 10 /hpf   Bacteria, UA Few (A) None seen/Few  Urinalysis, Routine w reflex microscopic (not at [facility])  Result Value Ref Range   Specific Gravity, UA 1.015 1.005 - 1.030   pH, UA 5.0 5.0 - 7.5   Color, UA Yellow Yellow   Appearance Ur Clear Clear   Leukocytes, UA Negative Negative   Protein, UA Negative Negative/Trace   Glucose, UA Negative Negative   Ketones, UA Negative Negative   RBC, UA Trace (A) Negative   Bilirubin, UA Negative Negative  Urobilinogen, Ur 0.2 0.2 - 1.0 mg/dL   Nitrite, UA Negative Negative   Microscopic Examination See below:   Basic metabolic panel  Result Value Ref Range   Glucose 189 (H) 65 - 99 mg/dL   BUN 41 (H) 8 - 27 mg/dL   Creatinine, Ser 1.87 (H) 0.76 - 1.27 mg/dL   GFR calc non Af Amer 36 (L) >59 mL/min/1.73   GFR calc Af Amer 42 (L) >59 mL/min/1.73   BUN/Creatinine Ratio 22 10 - 24   Sodium 140 134 - 144 mmol/L   Potassium 4.4 3.5 - 5.2 mmol/L   Chloride 101 96 - 106 mmol/L   CO2 21 18 - 29 mmol/L   Calcium 9.2 8.6 - 10.2 mg/dL      Assessment & Plan:   Problem List Items Addressed This Visit      Cardiovascular and  Mediastinum   Essential hypertension    Blood pressure getting too low will decrease amlodipine from 10 mg to 5 mg      Relevant Medications   amLODipine (NORVASC) 5 MG tablet     Endocrine   Type 2 DM mild nonproliferative retinopathy, no macular edema, control (HCC)    Her diabetes will change to [facility] to obtain better control guarding and 15 units with written directions on titration      Relevant Medications   Insulin Glargine-Lixisenatide (SOLIQUA) 100-33 UNT-MCG/ML SOPN    Other Visit Diagnoses    Diabetes mellitus without complication ([place])    -  Primary    Relevant Medications    Insulin Glargine-Lixisenatide (SOLIQUA) 100-33 UNT-MCG/ML SOPN    Other Relevant Orders    Bayer DCA Hb A1c Waived        Follow up plan: Return in about 3 months (around 05/20/2016) for a1c.

## 2016-02-18 NOTE — Addendum Note (Signed)
Addended by: Rowe Clack H on: 02/18/2016 11:14 AM   Modules accepted: Miquel Dunn

## 2016-02-18 NOTE — Assessment & Plan Note (Signed)
Blood pressure getting too low will decrease amlodipine from 10 mg to 5 mg

## 2016-02-18 NOTE — Assessment & Plan Note (Signed)
Her diabetes will change to [facility] to obtain better control guarding and 15 units with written directions on titration

## 2016-03-26 ENCOUNTER — Telehealth: Payer: Self-pay

## 2016-03-26 NOTE — Telephone Encounter (Signed)
Patient asked for samples of Soliqua, they are ready in fridge

## 2016-04-10 ENCOUNTER — Telehealth: Payer: Self-pay | Admitting: Family Medicine

## 2016-04-10 NOTE — Telephone Encounter (Signed)
Phone call Discussed with patient feels that his insulins not working but patient hasn't been adjusting dose discussed dosing adjustment

## 2016-04-10 NOTE — Telephone Encounter (Signed)
Call pt 

## 2016-04-10 NOTE — Telephone Encounter (Signed)
Pt called stated the Willeen Niece is not working, his sugar is higher than normal. Wants to switch back to Lantus if possible. Pharm is [facility] in [place]. Also, wants to know if we have samples of the Lantus. Thanks.

## 2016-04-28 DIAGNOSIS — D485 Neoplasm of uncertain behavior of skin: Secondary | ICD-10-CM | POA: Diagnosis not present

## 2016-04-28 DIAGNOSIS — L821 Other seborrheic keratosis: Secondary | ICD-10-CM | POA: Diagnosis not present

## 2016-04-28 DIAGNOSIS — L570 Actinic keratosis: Secondary | ICD-10-CM | POA: Diagnosis not present

## 2016-04-28 DIAGNOSIS — D1801 Hemangioma of skin and subcutaneous tissue: Secondary | ICD-10-CM | POA: Diagnosis not present

## 2016-04-28 DIAGNOSIS — L820 Inflamed seborrheic keratosis: Secondary | ICD-10-CM | POA: Diagnosis not present

## 2016-04-28 DIAGNOSIS — L720 Epidermal cyst: Secondary | ICD-10-CM | POA: Diagnosis not present

## 2016-05-15 ENCOUNTER — Telehealth: Payer: Self-pay | Admitting: Family Medicine

## 2016-05-15 NOTE — Telephone Encounter (Signed)
Pt called and stated that he needed a sample of insulin lispro (HUMALOG KWIKPEN) 100 UNIT/ML KiwkPen. There are 2 available for him to pick up

## 2016-05-26 ENCOUNTER — Encounter: Payer: Self-pay | Admitting: Family Medicine

## 2016-05-26 ENCOUNTER — Ambulatory Visit (INDEPENDENT_AMBULATORY_CARE_PROVIDER_SITE_OTHER): Payer: Medicare Other | Admitting: Family Medicine

## 2016-05-26 VITALS — BP 144/73 | HR 58 | Temp 97.9°F | Ht 73.0 in | Wt 203.0 lb

## 2016-05-26 DIAGNOSIS — Z794 Long term (current) use of insulin: Secondary | ICD-10-CM

## 2016-05-26 DIAGNOSIS — E119 Type 2 diabetes mellitus without complications: Secondary | ICD-10-CM

## 2016-05-26 DIAGNOSIS — I10 Essential (primary) hypertension: Secondary | ICD-10-CM

## 2016-05-26 DIAGNOSIS — E113299 Type 2 diabetes mellitus with mild nonproliferative diabetic retinopathy without macular edema, unspecified eye: Secondary | ICD-10-CM | POA: Diagnosis not present

## 2016-05-26 LAB — MICROALBUMIN, URINE WAIVED
CREATININE, URINE WAIVED: 200 mg/dL (ref 10–300)
MICROALB, UR WAIVED: 150 mg/L — AB (ref 0–19)

## 2016-05-26 LAB — BAYER DCA HB A1C WAIVED: HB A1C: 7.5 % — AB (ref <7.0)

## 2016-05-26 MED ORDER — INSULIN GLARGINE 100 UNIT/ML SOLOSTAR PEN: 50.0000 [IU] | PEN_INJECTOR | Freq: Every morning | SUBCUTANEOUS | 12 refills | Status: DC

## 2016-05-26 MED ORDER — INSULIN LISPRO 100 UNIT/ML (KWIKPEN): 20.0000 [IU] | PEN_INJECTOR | Freq: Three times a day (TID) | SUBCUTANEOUS | 12 refills | Status: DC

## 2016-05-26 NOTE — Assessment & Plan Note (Signed)
Poor control off medications will restart losartan 50 mg patient will break his current prescription and half not taking amlodipine.

## 2016-05-26 NOTE — Assessment & Plan Note (Signed)
Poor control but better will go back to Lantus discussed titration (Reviewed endocrine referral patient declining now

## 2016-05-26 NOTE — Progress Notes (Signed)
BP (!) 144/73 (BP Location: Right Arm, Cuff Size: Normal)   Pulse (!) 58   Temp 97.9 F (36.6 C)   Ht 6\' 1"  (1.854 m)   Wt 203 lb (92.1 kg)   SpO2 99%   BMI 26.78 kg/m    Subjective:    Patient ID: Jack Estrada, male    DOB: 08/30/1946, 70 y.o.   MRN: [id number]  HPI: Jack Estrada is a 70 y.o. male  Chief Complaint  Patient presents with  . Diabetes  . Hypertension    patient states he has not taken his meds since April, after significant weight loss he was feeling dizzy  Patient ever since change of insulin has not felt as well wants to go back on Lantus. Has been using 30 units with blood sugars around 150 of Soliqua. After meal blood sugars been somewhat elevated Has not been taking blood pressure medicines and blood pressures been elevated patient was having spells of low blood pressure being very lightheaded and dizzy  Relevant past medical, surgical, family and social history reviewed and updated as indicated. Interim medical history since our last visit reviewed. Allergies and medications reviewed and updated.  Review of Systems  Constitutional: Negative.   Respiratory: Negative.   Cardiovascular: Negative.     Per HPI unless specifically indicated above     Objective:    BP (!) 144/73 (BP Location: Right Arm, Cuff Size: Normal)   Pulse (!) 58   Temp 97.9 F (36.6 C)   Ht 6\' 1"  (1.854 m)   Wt 203 lb (92.1 kg)   SpO2 99%   BMI 26.78 kg/m   Wt Readings from Last 3 Encounters:  05/26/16 203 lb (92.1 kg)  02/18/16 206 lb (93.4 kg)  01/15/16 211 lb (95.7 kg)    Physical Exam  Constitutional: He is oriented to person, place, and time. He appears well-developed and well-nourished. No distress.  HENT:  Head: Normocephalic and atraumatic.  Right Ear: Hearing normal.  Left Ear: Hearing normal.  Nose: Nose normal.  Eyes: Conjunctivae and lids are normal. Right eye exhibits no discharge. Left eye exhibits no discharge. No scleral icterus.  Cardiovascular:  Normal rate, regular rhythm and normal heart sounds.   Pulmonary/Chest: Effort normal and breath sounds normal. No respiratory distress.  Musculoskeletal: Normal range of motion.  Neurological: He is alert and oriented to person, place, and time.  Skin: Skin is intact. No rash noted.  Psychiatric: He has a normal mood and affect. His speech is normal and behavior is normal. Judgment and thought content normal. Cognition and memory are normal.    Results for orders placed or performed in visit on 04/04/16  HM DIABETES EYE EXAM  Result Value Ref Range   HM Diabetic Eye Exam No Retinopathy No Retinopathy      Assessment & Plan:   Problem List Items Addressed This Visit      Cardiovascular and Mediastinum   Essential hypertension    Poor control off medications will restart losartan 50 mg patient will break his current prescription and half not taking amlodipine.        Endocrine   Type 2 DM mild nonproliferative retinopathy, no macular edema, control (HCC) - Primary    Poor control but better will go back to Lantus discussed titration (Reviewed endocrine referral patient declining now      Relevant Medications   Insulin Glargine (LANTUS SOLOSTAR) 100 UNIT/ML Solostar Pen   insulin lispro (HUMALOG KWIKPEN) 100 UNIT/ML KiwkPen  Other Relevant Orders   Bayer DCA Hb A1c Waived   Microalbumin, Urine Waived    Other Visit Diagnoses    Diabetes mellitus without complication (HCC)       Relevant Medications   Insulin Glargine (LANTUS SOLOSTAR) 100 UNIT/ML Solostar Pen   insulin lispro (HUMALOG KWIKPEN) 100 UNIT/ML KiwkPen       Follow up plan: Return in about 3 months (around 08/25/2016) for Physical Exam, Hemoglobin A1c.

## 2016-06-06 ENCOUNTER — Ambulatory Visit (INDEPENDENT_AMBULATORY_CARE_PROVIDER_SITE_OTHER): Payer: Medicare Other | Admitting: Family Medicine

## 2016-06-06 ENCOUNTER — Encounter: Payer: Self-pay | Admitting: Family Medicine

## 2016-06-06 VITALS — BP 156/83 | HR 57 | Temp 97.9°F | Wt 207.0 lb

## 2016-06-06 DIAGNOSIS — M549 Dorsalgia, unspecified: Secondary | ICD-10-CM | POA: Diagnosis not present
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
## 2016-07-11 ENCOUNTER — Telehealth: Payer: Self-pay | Admitting: Family Medicine

## 2016-07-11 NOTE — Telephone Encounter (Signed)
One sample ready for patient to pick up.

## 2016-07-14 DIAGNOSIS — J029 Acute pharyngitis, unspecified: Secondary | ICD-10-CM | POA: Diagnosis not present

## 2016-07-31 ENCOUNTER — Telehealth: Payer: Self-pay | Admitting: Family Medicine

## 2016-07-31 NOTE — Telephone Encounter (Signed)
Pt would like to know if he could get samples of lantus and humalog.

## 2016-07-31 NOTE — Telephone Encounter (Signed)
Out of Humalog but samples of Lantus given.

## 2016-08-04 ENCOUNTER — Telehealth: Payer: Self-pay | Admitting: Family Medicine

## 2016-08-04 NOTE — Telephone Encounter (Signed)
Routing to provider  
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
## 2016-08-05 NOTE — Telephone Encounter (Signed)
Called pt to inform him that we had received samples and that he could pick them up at any time. No answer, LMOM

## 2016-08-05 NOTE — Telephone Encounter (Signed)
Pt notified and in route to get them

## 2016-08-21 ENCOUNTER — Telehealth: Payer: Self-pay | Admitting: Family Medicine

## 2016-08-21 NOTE — Telephone Encounter (Signed)
No samples available. Unable to leave a message, voicemail box is full.

## 2016-08-21 NOTE — Telephone Encounter (Signed)
Need authorization from provider before. Dr. Jeananne Rama, how many would you like to give to patient?  It's Lantus Solostar 100u/mL  Signature: 50 units subcutaneous every morning.

## 2016-08-21 NOTE — Telephone Encounter (Signed)
Patient notified

## 2016-08-21 NOTE — Telephone Encounter (Signed)
Patient would like to get samples of his Lantus if available. He states he needs enough to get him through December.  He will stop by in the morning to pick up these Thanks

## 2016-08-21 NOTE — Telephone Encounter (Signed)
sample

## 2016-08-25 ENCOUNTER — Ambulatory Visit: Payer: Medicare Other | Admitting: Family Medicine

## 2016-08-27 DIAGNOSIS — E113293 Type 2 diabetes mellitus with mild nonproliferative diabetic retinopathy without macular edema, bilateral: Secondary | ICD-10-CM | POA: Diagnosis not present

## 2016-08-27 LAB — HM DIABETES EYE EXAM

## 2016-09-17 ENCOUNTER — Telehealth: Payer: Self-pay | Admitting: Family Medicine

## 2016-09-17 NOTE — Telephone Encounter (Signed)
Patient called to see if we had any samples available yet for Humalog or Lantus  Thank You Santiago Glad

## 2016-09-17 NOTE — Telephone Encounter (Signed)
Left message on machine for pt to return call to the office.  We do have one Humalog KwikPen U-100 for patient. Will place back in refrigerator with a sticky note w/ pt's name and DOB. Sample slip completed, will place back in closet once pt picks up/confirms medication desire. Will be at my desk until that time.

## 2016-09-17 NOTE — Telephone Encounter (Signed)
Message relayed to patient. Verbalized understanding will come pick up sample shortly.

## 2016-09-25 ENCOUNTER — Ambulatory Visit: Payer: Medicare Other | Admitting: Family Medicine

## 2016-09-30 DIAGNOSIS — M1812 Unilateral primary osteoarthritis of first carpometacarpal joint, left hand: Secondary | ICD-10-CM | POA: Diagnosis not present

## 2016-09-30 DIAGNOSIS — M7541 Impingement syndrome of right shoulder: Secondary | ICD-10-CM | POA: Diagnosis not present

## 2016-10-06 ENCOUNTER — Telehealth: Payer: Self-pay | Admitting: Family Medicine

## 2016-10-06 NOTE — Telephone Encounter (Signed)
Patient called to see if we had any samples of either Humalog or Lantus  Thank You Santiago Glad  [phone]

## 2016-10-06 NOTE — Telephone Encounter (Signed)
Thank you :)

## 2016-10-06 NOTE — Telephone Encounter (Signed)
We do have a Humalog sample available. No Lantus. VM left of pt's number to make him aware. Sample in refrigerator w/ pt's name and DOB. Sample slip will be left at my desk until pickup.

## 2016-10-15 ENCOUNTER — Telehealth: Payer: Self-pay | Admitting: Family Medicine

## 2016-10-15 ENCOUNTER — Ambulatory Visit
Admission: RE | Admit: 2016-10-15 | Discharge: 2016-10-15 | Disposition: A | Discharge status: Home / Self Care | Payer: Medicare Other | Source: Ambulatory Visit | Attending: Family Medicine | Admitting: Family Medicine

## 2016-10-15 ENCOUNTER — Encounter: Payer: Self-pay | Admitting: Family Medicine

## 2016-10-15 ENCOUNTER — Ambulatory Visit (INDEPENDENT_AMBULATORY_CARE_PROVIDER_SITE_OTHER): Payer: Medicare Other | Admitting: Family Medicine

## 2016-10-15 VITALS — BP 173/75 | HR 55 | Temp 98.3°F | Ht 72.0 in | Wt 222.4 lb

## 2016-10-15 DIAGNOSIS — R0781 Pleurodynia: Secondary | ICD-10-CM

## 2016-10-15 DIAGNOSIS — R079 Chest pain, unspecified: Secondary | ICD-10-CM | POA: Diagnosis not present

## 2016-10-15 DIAGNOSIS — M954 Acquired deformity of chest and rib: Secondary | ICD-10-CM | POA: Insufficient documentation

## 2016-10-15 MED ORDER — TRAMADOL HCL 50 MG PO TABS: 50.0000 mg | ORAL_TABLET | Freq: Three times a day (TID) | ORAL | 0 refills | Status: DC | PRN

## 2016-10-15 NOTE — Progress Notes (Signed)
   BP (!) 173/75 (BP Location: Right Arm, Patient Position: Sitting, Cuff Size: Normal)   Pulse (!) 55   Temp 98.3 F (36.8 C)   Ht 6' (1.829 m)   Wt 222 lb 6.4 oz (100.9 kg)   SpO2 98%   BMI 30.16 kg/m    Subjective:    Patient ID: Jack Estrada, male    DOB: July 20, 1946, 71 y.o.   MRN: [id number]  HPI: Jack Estrada is a 71 y.o. male  Chief Complaint  Patient presents with  . Rib Injury    Patient fell. Thinks may have broken a rib.   Patient presents with persistent left rib pain. Fell 10 days ago after tripping and landed on left chest. Denies wound or bruising to area. No SOB, but painful to take deep breaths. Pain is very sharp and worst at night, has been keeping him awake. Taking ibuprofen with some relief.   Relevant past medical, surgical, family and social history reviewed and updated as indicated. Interim medical history since our last visit reviewed. Allergies and medications reviewed and updated.  Review of Systems  Constitutional: Negative.   HENT: Negative.   Respiratory: Negative.   Cardiovascular: Negative.   Gastrointestinal: Negative.   Genitourinary: Negative.   Musculoskeletal:       Chest pain s/p fall  Skin: Negative.   Neurological: Negative.   Psychiatric/Behavioral: Negative.     Per HPI unless specifically indicated above     Objective:    BP (!) 173/75 (BP Location: Right Arm, Patient Position: Sitting, Cuff Size: Normal)   Pulse (!) 55   Temp 98.3 F (36.8 C)   Ht 6' (1.829 m)   Wt 222 lb 6.4 oz (100.9 kg)   SpO2 98%   BMI 30.16 kg/m   Wt Readings from Last 3 Encounters:  10/15/16 222 lb 6.4 oz (100.9 kg)  06/06/16 207 lb (93.9 kg)  05/26/16 203 lb (92.1 kg)    Physical Exam  Constitutional: He is oriented to person, place, and time. He appears well-developed and well-nourished. No distress.  HENT:  Head: Atraumatic.  Eyes: Conjunctivae are normal. Pupils are equal, round, and reactive to light. No scleral icterus.  Neck:  Normal range of motion. Neck supple.  Cardiovascular: Normal rate, regular rhythm and normal heart sounds.   Pulmonary/Chest: Effort normal and breath sounds normal. No respiratory distress. He exhibits tenderness (left 3rd or 4th rib area point tender to palpation).  Musculoskeletal: Normal range of motion. He exhibits edema (some edema left chest in area of pain).  Neurological: He is alert and oriented to person, place, and time.  Skin: Skin is warm and dry.  Psychiatric: He has a normal mood and affect. His behavior is normal. Thought content normal.  Nursing note and vitals reviewed.     Assessment & Plan:   Problem List Items Addressed This Visit    None    Visit Diagnoses    Rib pain on left side    -  Primary   Will obtain left rib film to r/o fx. Tramadol prn for pain relief during sleep. Continue tylenol or ibuprofen for daytime pain relief.   Relevant Orders   DG Ribs Unilateral Left       Follow up plan: Return if symptoms worsen or fail to improve.

## 2016-10-15 NOTE — Telephone Encounter (Signed)
Patient notified

## 2016-10-15 NOTE — Telephone Encounter (Signed)
Please call pt and let him know that his x-ray shows a possible buckle fracture in the area of his pain, but it is non-displaced so nothing further needs to be done and this will heal on it's own over time. Continue as planned with as needed pain medication, rest as much as possible, ice to area if that provides any comfort.

## 2016-10-15 NOTE — Patient Instructions (Signed)
Follow up as needed

## 2016-10-27 DIAGNOSIS — M1812 Unilateral primary osteoarthritis of first carpometacarpal joint, left hand: Secondary | ICD-10-CM | POA: Diagnosis not present

## 2016-11-05 ENCOUNTER — Encounter: Payer: Medicare Other | Admitting: Unknown Physician Specialty

## 2016-11-19 ENCOUNTER — Telehealth: Payer: Self-pay
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
## 2016-11-20 ENCOUNTER — Other Ambulatory Visit: Payer: Self-pay | Admitting: Family Medicine

## 2016-11-20 NOTE — Telephone Encounter (Signed)
Routing to provider  

## 2016-11-20 NOTE — Telephone Encounter (Signed)
He'll need an appointment.

## 2016-11-20 NOTE — Telephone Encounter (Signed)
Please call and schedule pt an appointment, will need for someone to fill out his preop form.

## 2016-11-21 DIAGNOSIS — Z01818 Encounter for other preprocedural examination: Secondary | ICD-10-CM | POA: Diagnosis not present

## 2016-11-21 DIAGNOSIS — M1812 Unilateral primary osteoarthritis of first carpometacarpal joint, left hand: Secondary | ICD-10-CM | POA: Diagnosis not present

## 2016-11-24 NOTE — Telephone Encounter (Signed)
Patient requested to see Malachy Mood .  Scheduled him 3/13 @ 2:00    Thanks

## 2016-11-25 ENCOUNTER — Ambulatory Visit (INDEPENDENT_AMBULATORY_CARE_PROVIDER_SITE_OTHER): Payer: Medicare Other | Admitting: Unknown Physician Specialty

## 2016-11-25 ENCOUNTER — Encounter: Payer: Self-pay | Admitting: Unknown Physician Specialty

## 2016-11-25 VITALS — BP 123/78 | HR 60 | Temp 98.6°F | Wt 228.6 lb

## 2016-11-25 DIAGNOSIS — Z01818 Encounter for other preprocedural examination: Secondary | ICD-10-CM | POA: Diagnosis not present

## 2016-11-25 DIAGNOSIS — I10 Essential (primary) hypertension: Secondary | ICD-10-CM | POA: Diagnosis not present

## 2016-11-25 DIAGNOSIS — M549 Dorsalgia, unspecified: Secondary | ICD-10-CM | POA: Diagnosis not present

## 2016-11-25 DIAGNOSIS — Z794 Long term (current) use of insulin: Secondary | ICD-10-CM

## 2016-11-25 DIAGNOSIS — Z0000 Encounter for general adult medical examination without abnormal findings: Secondary | ICD-10-CM | POA: Diagnosis not present

## 2016-11-25 DIAGNOSIS — E113293 Type 2 diabetes mellitus with mild nonproliferative diabetic retinopathy without macular edema, bilateral: Secondary | ICD-10-CM | POA: Diagnosis not present

## 2016-11-25 LAB — LIPID PANEL PICCOLO, WAIVED
CHOLESTEROL PICCOLO, WAIVED: 151 mg/dL (ref <200)
Chol/HDL Ratio Piccolo,Waive: 3.8 mg/dL
HDL Chol Piccolo, Waived: 40 mg/dL — ABNORMAL LOW (ref >59)
LDL CHOL CALC PICCOLO WAIVED: 71 mg/dL (ref <100)
Triglycerides Piccolo,Waived: 200 mg/dL — ABNORMAL HIGH (ref <150)
VLDL CHOL CALC PICCOLO,WAIVE: 40 mg/dL — AB (ref <30)

## 2016-11-25 LAB — MICROSCOPIC EXAMINATION
BACTERIA UA: NONE SEEN
Epithelial Cells (non renal): NONE SEEN /hpf (ref 0–10)
RBC, UA: NONE SEEN /hpf (ref 0–?)

## 2016-11-25 LAB — UA/M W/RFLX CULTURE, ROUTINE
BILIRUBIN UA: NEGATIVE
Glucose, UA: NEGATIVE
Ketones, UA: NEGATIVE
LEUKOCYTES UA: NEGATIVE
Nitrite, UA: NEGATIVE
PH UA: 5.5 (ref 5.0–7.5)
RBC, UA: NEGATIVE
Specific Gravity, UA: 1.025 (ref 1.005–1.030)
Urobilinogen, Ur: 0.2 mg/dL (ref 0.2–1.0)

## 2016-11-25 LAB — MICROALBUMIN, URINE WAIVED
CREATININE, URINE WAIVED: 300 mg/dL (ref 10–300)
MICROALB, UR WAIVED: 80 mg/L — AB (ref 0–19)

## 2016-11-25 LAB — BAYER DCA HB A1C WAIVED: HB A1C (BAYER DCA - WAIVED): 7.6 % — ABNORMAL HIGH (ref <7.0)

## 2016-11-25 NOTE — Assessment & Plan Note (Signed)
Normal prostate today

## 2016-11-25 NOTE — Assessment & Plan Note (Addendum)
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  Hgb A1C is 7.6.  See Dr. Jeananne Rama in 3 months

## 2016-11-25 NOTE — Assessment & Plan Note (Signed)
Stable, continue present medications.   

## 2016-11-25 NOTE — Progress Notes (Signed)
BP 123/78 (BP Location: Left Arm, Patient Position: Sitting, Cuff Size: Large)   Pulse 60   Temp 98.6 F (37 C)   Wt 228 lb 9.6 oz (103.7 kg)   SpO2 97%   BMI 31.00 kg/m    Subjective:    Patient ID: Jack Estrada, male    DOB: 16-Nov-1945, 71 y.o.   MRN: [id number]  HPI: Jack Estrada is a 71 y.o. male  Chief Complaint  Patient presents with  . Surgery Cleanance  . Back Pain    pt states he has been having lower back pain and groin pain for 2 weeks    Diabetes 50 units of Lantus a day.  Takes 8-10 short acting with meals.  Last Hgb A1C was 7.5 and lost to f/u.  No problems with frequent urination.  Blood sugar ranges from 98-123.  Does complain of espisodic hypoglycemia and working on insulin adjustment.  Would like DOT certification and needs a Endocrinologist  Back pain Started 2-3 weeks ago.  Aggravated by bending, even the slightest amount.  Having some groin pain with problems urinating 1 month ago showing improvement with Saw Palmetto.3 Ibuprofen helps.   Also complaining of shoulder pain and wonders if he needs an injection.  Thumb surgery Needs a fusion of right thumb MTP with revisions.      Relevant past medical, surgical, family and social history reviewed and updated as indicated. Interim medical history since our last visit reviewed. Allergies and medications reviewed and updated.  Review of Systems  Per HPI unless specifically indicated above     Objective:    BP 123/78 (BP Location: Left Arm, Patient Position: Sitting, Cuff Size: Large)   Pulse 60   Temp 98.6 F (37 C)   Wt 228 lb 9.6 oz (103.7 kg)   SpO2 97%   BMI 31.00 kg/m   Wt Readings from Last 3 Encounters:  11/25/16 228 lb 9.6 oz (103.7 kg)  10/15/16 222 lb 6.4 oz (100.9 kg)  06/06/16 207 lb (93.9 kg)    Physical Exam  Constitutional: He is oriented to person, place, and time. He appears well-developed and well-nourished. No distress.  HENT:  Head: Normocephalic and atraumatic.  Eyes:  Conjunctivae and lids are normal. Right eye exhibits no discharge. Left eye exhibits no discharge. No scleral icterus.  Neck: Normal range of motion. Neck supple. No JVD present. Carotid bruit is not present.  Cardiovascular: Normal rate, regular rhythm and normal heart sounds.   Pulmonary/Chest: Effort normal and breath sounds normal. No respiratory distress.  Abdominal: Normal appearance. There is no splenomegaly or hepatomegaly.  Genitourinary: Rectum normal and prostate normal.  Musculoskeletal:       Lumbar back: He exhibits decreased range of motion, tenderness, bony tenderness and swelling.  Neurological: He is alert and oriented to person, place, and time.  Skin: Skin is warm, dry and intact. No rash noted. No pallor.  Psychiatric: He has a normal mood and affect. His behavior is normal. Judgment and thought content normal.   Normal EKG without significant changes from previous    Assessment & Plan:   Problem List Items Addressed This Visit      Unprioritized   Essential hypertension    Stable, continue present medications.        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  Hgb A1C is 7.6.  See Dr.  Crissman in 3 months      Relevant Orders   Lipid Panel Piccolo, Waived   Bayer DCA Hb A1c Waived   Comprehensive metabolic panel   Microalbumin, Urine Waived   Ambulatory referral to Endocrinology    Other Visit Diagnoses    Routine general medical examination at a health care facility    -  Primary   Preop examination       Relevant Orders   EKG 12-Lead (Completed)   CBC with Differential/Platelet   Back pain, unspecified back location, unspecified back pain laterality, unspecified chronicity       Normal prostate.  Back strain.  Discussed exercises with patient.  Continue Ibuprofen   Relevant Orders   UA/M w/rflx Culture, Routine       Follow up  plan: Return for visit with Dr. Jeananne Rama.

## 2016-11-25 NOTE — Patient Instructions (Signed)
[employer] website  [employer] instruction for medical Waiver

## 2016-11-26 LAB — COMPREHENSIVE METABOLIC PANEL
A/G RATIO: 1.5 (ref 1.2–2.2)
ALT: 31 IU/L (ref 0–44)
AST: 19 IU/L (ref 0–40)
Albumin: 4.1 g/dL (ref 3.6–4.8)
Alkaline Phosphatase: 87 IU/L (ref 39–117)
BUN/Creatinine Ratio: 19 (ref 10–24)
BUN: 26 mg/dL (ref 8–27)
Bilirubin Total: 0.2 mg/dL (ref 0.0–1.2)
CALCIUM: 9.1 mg/dL (ref 8.6–10.2)
CO2: 24 mmol/L (ref 18–29)
Chloride: 104 mmol/L (ref 96–106)
Creatinine, Ser: 1.35 mg/dL — ABNORMAL HIGH (ref 0.76–1.27)
GFR, EST AFRICAN AMERICAN: 62 mL/min/{1.73_m2} (ref >59)
GFR, EST NON AFRICAN AMERICAN: 54 mL/min/{1.73_m2} — AB (ref >59)
GLUCOSE: 162 mg/dL — AB (ref 65–99)
Globulin, Total: 2.8 g/dL (ref 1.5–4.5)
Potassium: 4.4 mmol/L (ref 3.5–5.2)
Sodium: 141 mmol/L (ref 134–144)
TOTAL PROTEIN: 6.9 g/dL (ref 6.0–8.5)

## 2016-11-26 LAB — CBC WITH DIFFERENTIAL/PLATELET
BASOS: 0 %
Basophils Absolute: 0 10*3/uL (ref 0.0–0.2)
EOS (ABSOLUTE): 0.2 10*3/uL (ref 0.0–0.4)
Eos: 4 %
Hematocrit: 41.8 % (ref 37.5–51.0)
Hemoglobin: 13.8 g/dL (ref 13.0–17.7)
IMMATURE GRANS (ABS): 0 10*3/uL (ref 0.0–0.1)
IMMATURE GRANULOCYTES: 0 %
LYMPHS: 28 %
Lymphocytes Absolute: 1.6 10*3/uL (ref 0.7–3.1)
MCH: 29.9 pg (ref 26.6–33.0)
MCHC: 33 g/dL (ref 31.5–35.7)
MCV: 91 fL (ref 79–97)
MONOCYTES: 7 %
Monocytes Absolute: 0.4 10*3/uL (ref 0.1–0.9)
NEUTROS ABS: 3.5 10*3/uL (ref 1.4–7.0)
NEUTROS PCT: 61 %
PLATELETS: 161 10*3/uL (ref 150–379)
RBC: 4.61 x10E6/uL (ref 4.14–5.80)
RDW: 14.7 % (ref 12.3–15.4)
WBC: 5.7 10*3/uL (ref 3.4–10.8)

## 2016-11-27 ENCOUNTER — Encounter: Payer: Self-pay | Admitting: Family Medicine

## 2016-12-09 DIAGNOSIS — M19042 Primary osteoarthritis, left hand: Secondary | ICD-10-CM | POA: Diagnosis not present

## 2016-12-09 DIAGNOSIS — T859XXA Unspecified complication of internal prosthetic device, implant and graft, initial encounter: Secondary | ICD-10-CM | POA: Diagnosis not present

## 2016-12-09 DIAGNOSIS — I10 Essential (primary) hypertension: Secondary | ICD-10-CM | POA: Diagnosis not present

## 2016-12-09 DIAGNOSIS — E119 Type 2 diabetes mellitus without complications: Secondary | ICD-10-CM | POA: Diagnosis not present

## 2016-12-09 DIAGNOSIS — N289 Disorder of kidney and ureter, unspecified: Secondary | ICD-10-CM | POA: Diagnosis not present

## 2016-12-09 DIAGNOSIS — M1812 Unilateral primary osteoarthritis of first carpometacarpal joint, left hand: Secondary | ICD-10-CM | POA: Diagnosis not present

## 2016-12-15 DIAGNOSIS — Z4789 Encounter for other orthopedic aftercare: Secondary | ICD-10-CM | POA: Diagnosis not present

## 2016-12-18 ENCOUNTER — Telehealth: Payer: Self-pay | Admitting: Family Medicine

## 2016-12-18 NOTE — Telephone Encounter (Signed)
Patient needs an appt with endocrinologist he was referred to  [facility] which  could not get him in until June.  He is hoping he can be referred to someone who can see him sooner.  Thanks

## 2016-12-18 NOTE — Telephone Encounter (Signed)
Jack Estrada, is there anywhere that can see him sooner?

## 2016-12-19 DIAGNOSIS — M1812 Unilateral primary osteoarthritis of first carpometacarpal joint, left hand: Secondary | ICD-10-CM | POA: Diagnosis not present

## 2016-12-21 ENCOUNTER — Other Ambulatory Visit: Payer: Self-pay | Admitting: Family Medicine

## 2016-12-22 ENCOUNTER — Other Ambulatory Visit: Payer: Self-pay

## 2016-12-22 DIAGNOSIS — E113299 Type 2 diabetes mellitus with mild nonproliferative diabetic retinopathy without macular edema, unspecified eye: Secondary | ICD-10-CM

## 2016-12-22 NOTE — Telephone Encounter (Signed)
Pt agreeable to see [facility] in [place]. Must be after May 1st as he is leaving for [place] today.

## 2016-12-22 NOTE — Telephone Encounter (Signed)
There is [facility] in [place] we could try if patient would like to go to [place]. Let me know if patient agrees and I will call [facility] and see when their next available is.

## 2016-12-22 NOTE — Telephone Encounter (Signed)
Last OV: 11/25/16 Next OV: 03/09/17  BMP Latest Ref Rng & Units 11/25/2016 01/15/2016 07/09/2015  Glucose 65 - 99 mg/dL 162(H) 189(H) 107(H)  BUN 8 - 27 mg/dL 26 41(H) 24  Creatinine 0.76 - 1.27 mg/dL 1.35(H) 1.87(H) 1.30(H)  BUN/Creat Ratio 10 - 24 19 22 18   Sodium 134 - 144 mmol/L 141 140 142  Potassium 3.5 - 5.2 mmol/L 4.4 4.4 4.5  Chloride 96 - 106 mmol/L 104 101 103  CO2 18 - 29 mmol/L 24 21 25   Calcium 8.6 - 10.2 mg/dL 9.1 9.2 9.6

## 2016-12-22 NOTE — Telephone Encounter (Signed)
New referral entered and directed to [facility].

## 2016-12-29 ENCOUNTER — Encounter: Payer: Self-pay | Admitting: Endocrinology

## 2017-01-06 ENCOUNTER — Other Ambulatory Visit: Payer: Self-pay | Admitting: Family Medicine

## 2017-01-06 NOTE — Telephone Encounter (Signed)
Your patient 

## 2017-01-25 NOTE — Progress Notes (Signed)
Patient ID: Jack Estrada, male   DOB: 1946/02/17, 71 y.o.   MRN: [id number]            Reason for Appointment: Consultation for Type 2 Diabetes  Referring physician: Chrissman   History of Present Illness:          Date of diagnosis of type 2 diabetes mellitus:    1992      Background history:   He had been given metformin in the first few years of his diagnosis Was started on insulin in 7-8 years ago when his blood sugars were poorly controlled In the last few years with A1c has been relatively higher around 7.5-8.6 although he thinks that some point it was below 7% previously He was taking metformin also but this was stopped probably about a year ago when his kidney function was worse  About 2 years ago he had marked obesity and he started changing his diet significantly with cutting back on carbohydrates and fats and also exercising more regularly, he was able to lose weight and also reduce his insulin doses  Recent history:   INSULIN regimen is: Lantus 25 units daily, Humalog 10 units after eating  Non-insulin hypoglycemic drugs the patient is taking are: None  He has been referred here for persistently high A1c, recently 7.6  Current management, blood sugar patterns and problems identified:    he has difficulty affording his insulin because of being in Medicare and on his own he has cut back on his Lantus on the last 6-8 months, previously taking 50 units  He does not change his Humalog based on what he is eating and takes the same dose after every meal  He has never been told to take his insulin before eating  His blood sugars are reportedly around 200 after breakfast but does not know what they are after other meals  He does not check her sugar very often as he has a very old lancet device and meter and it is painful to check sugar  No symptoms of hyperglycemia even when he is more active  He travels frequently but tries to eat salads when he is going out to  eat  Recently has been able to maintain his weight loss        Side effects from medications have been: None  Compliance with the medical regimen: Fair Hypoglycemia:   none  Glucose monitoring:  done  ?  1 times a day         Glucometer:  contour       Blood Glucose readings by recall   PREMEAL Breakfast Lunch Dinner Bedtime  Overall   Glucose range: 120-130      Median:        POST-MEAL PC Breakfast PC Lunch PC Dinner  Glucose range: 200  ?  Median:      Self-care: The diet that the patient has been following is: tries to limit .     Meal times are:  Breakfast is at Lunch: Dinner:   Typical meal intake: Breakfast is Eggs and toast or grits, frequently eating salads at lunch, avoiding fried food, snacks may be peanuts               Dietician visit, most recent: Never               Exercise: walking in the evening about 45 minutes, 5 days a week  Weight history:  Wt Readings from Last 3 Encounters:  01/26/17  222 lb (100.7 kg)  11/25/16 228 lb 9.6 oz (103.7 kg)  10/15/16 222 lb 6.4 oz (100.9 kg)    Glycemic control:  No results found for: HGBA1C Lab Results  Component Value Date   MICROALBUR 80 (H) 11/25/2016   LDLCALC 88 07/09/2015   CREATININE 1.35 (H) 11/25/2016   Lab Results  Component Value Date   MICRALBCREAT 30-300 (H) 11/25/2016    No results found for: FRUCTOSAMINE    Allergies as of 01/26/2017      Reactions   Benazepril Cough      Medication List       Accurate as of 01/26/17  1:29 PM. Always use your most recent med list.          amLODipine 10 MG tablet Commonly known as:  NORVASC TAKE 1 TABLET BY MOUTH DAILY   esomeprazole 20 MG capsule Commonly known as:  NEXIUM Take 20 mg by mouth every other day.   Insulin Glargine 100 UNIT/ML Solostar Pen Commonly known as:  LANTUS SOLOSTAR Inject 50 Units into the skin every morning.   insulin lispro 100 UNIT/ML KiwkPen Commonly known as:  HUMALOG KWIKPEN Inject 0.2-0.3 mLs (20-30 Units  total) into the skin 3 (three) times daily with meals.   sildenafil 20 MG tablet Commonly known as:  REVATIO TAKE ONE TABLET DAILY AS NEEDED.   traMADol 50 MG tablet Commonly known as:  ULTRAM Take 1 tablet (50 mg total) by mouth every 8 (eight) hours as needed.       Allergies:  Allergies  Allergen Reactions  . Benazepril Cough    Past Medical History:  Diagnosis Date  . Chronic kidney disease   . Hyperlipidemia   . Retinopathy due to secondary diabetes [facility])     Past Surgical History:  Procedure Laterality Date  . thumb surgery Left March 2016    Family History  Problem Relation Age of Onset  . Diabetes Mother   . Heart disease Father   . Stroke Father     Social History:  reports that he has never smoked. He has never used smokeless tobacco. He reports that he does not drink alcohol or use drugs.   Review of Systems  Constitutional: Negative for weight gain.  HENT: Negative.        No history of snoring  Eyes:       He can see fairly well with only difficulty in reading at a distance as some letters are obscured but he can see them if he moves his eyes are certain way.  He thinks his left eye with only one that affected since his laser treatment.  Right eye is reportedly 20/20 vision  Respiratory: Negative for shortness of breath.   Cardiovascular: Negative for chest pain.  Gastrointestinal: Negative for abdominal pain.  Endocrine: Positive for decreased libido and erectile dysfunction.       He has been given generic sildenafil and he prefers to take one tablet of 20 mg daily  Musculoskeletal: Negative for joint pain.  Skin: Negative for itching.  Neurological: Negative for numbness and balance difficulty.       At times may have burning in his feet  Psychiatric/Behavioral: Negative for insomnia.    Lipid history: He has not been on any lipid-lowering drugs    Lab Results  Component Value Date   CHOL 151 11/25/2016   HDL 34 (L) 07/09/2015    LDLCALC 88 07/09/2015   TRIG 200 (H) 11/25/2016   CHOLHDL 4.8 07/09/2015  Hypertension: for years, Now taking only amlodipine 10 mg  Most recent eye exam was 11/17.  He had photocoagulation for macular edema ?  In 2008  Most recent foot exam: 5/18    LABS:  No visits with results within 1 Week(s) from this visit.  Latest known visit with results is:  Office Visit on 11/25/2016  Component Date Value Ref Range Status  . Cholesterol Piccolo, Waived 11/25/2016 151  <200 mg/dL Final   Comment:                         Desirable                <200                         Borderline High      200- 239                         High                     >239   . HDL Chol Piccolo, Waived 11/25/2016 40* >59 mg/dL Final   Comment:                         Low HDL- Risk Factor     < 40                         High HDL- Negative       > 59                          Risk Factor (Desirable)   . Triglycerides Piccolo,Waived 11/25/2016 200* <150 mg/dL Final   Comment:                         Normal                   <150                         Borderline High     150 - 199                         High                200 - 499                         Very High                >499   . Chol/HDL Ratio Piccolo,Waive 11/25/2016 3.8  mg/dL Final   Comment:                                        Male      Male                         Low Risk      < 5.0      < 4.5  High Risk     > 4.9      > 4.4   . LDL Chol Calc Piccolo Waived 11/25/2016 71  <100 mg/dL Final   Comment:                         Optimal                  <100                         Near Optimal        100 - 129                         Borderline High     130 - 159                         High                160 - 189                         Very High                >189   . VLDL Chol Calc Piccolo,Waive 11/25/2016 40* <30 mg/dL Final   Comment:                         Normal                   < 30                          High                     > 29   . Bayer DCA Hb A1c Waived 11/25/2016 7.6* <7.0 % Final   Comment:                                       Diabetic Adult            <7.0                                       Healthy Adult        4.3 - 5.7                                                           (DCCT/NGSP) American Diabetes Association's Summary of Glycemic Recommendations for Adults with Diabetes: Hemoglobin A1c <7.0%. More stringent glycemic goals (A1c <6.0%) may further reduce complications at the cost of increased risk of hypoglycemia.   . Glucose 11/25/2016 162* 65 - 99 mg/dL Final  . BUN 11/25/2016 26  8 - 27 mg/dL Final  . Creatinine, Ser 11/25/2016 1.35* 0.76 - 1.27 mg/dL Final  . GFR calc non Af Amer 11/25/2016 54* >59 mL/min/1.73 Final  . GFR calc Af Amer 11/25/2016 62  >  59 mL/min/1.73 Final  . BUN/Creatinine Ratio 11/25/2016 19  10 - 24 Final  . Sodium 11/25/2016 141  134 - 144 mmol/L Final  . Potassium 11/25/2016 4.4  3.5 - 5.2 mmol/L Final  . Chloride 11/25/2016 104  96 - 106 mmol/L Final  . CO2 11/25/2016 24  18 - 29 mmol/L Final  . Calcium 11/25/2016 9.1  8.6 - 10.2 mg/dL Final  . Total Protein 11/25/2016 6.9  6.0 - 8.5 g/dL Final  . Albumin 11/25/2016 4.1  3.6 - 4.8 g/dL Final  . Globulin, Total 11/25/2016 2.8  1.5 - 4.5 g/dL Final  . Albumin/Globulin Ratio 11/25/2016 1.5  1.2 - 2.2 Final  . Bilirubin Total 11/25/2016 0.2  0.0 - 1.2 mg/dL Final  . Alkaline Phosphatase 11/25/2016 87  39 - 117 IU/L Final  . AST 11/25/2016 19  0 - 40 IU/L Final  . ALT 11/25/2016 31  0 - 44 IU/L Final  . Microalb, Ur Waived 11/25/2016 80* 0 - 19 mg/L Final  . Creatinine, Urine Waived 11/25/2016 300  10 - 300 mg/dL Final  . Microalb/Creat Ratio 11/25/2016 30-300* <30 mg/g Final   Comment:                              Abnormal:       30 - 300                         High Abnormal:           >300   . WBC 11/25/2016 5.7  3.4 - 10.8 x10E3/uL Final  . RBC 11/25/2016 4.61   4.14 - 5.80 x10E6/uL Final  . Hemoglobin 11/25/2016 13.8  13.0 - 17.7 g/dL Final  . Hematocrit 11/25/2016 41.8  37.5 - 51.0 % Final  . MCV 11/25/2016 91  79 - 97 fL Final  . MCH 11/25/2016 29.9  26.6 - 33.0 pg Final  . MCHC 11/25/2016 33.0  31.5 - 35.7 g/dL Final  . RDW 11/25/2016 14.7  12.3 - 15.4 % Final  . Platelets 11/25/2016 161  150 - 379 x10E3/uL Final  . Neutrophils 11/25/2016 61  Not Estab. % Final  . Lymphs 11/25/2016 28  Not Estab. % Final  . Monocytes 11/25/2016 7  Not Estab. % Final  . Eos 11/25/2016 4  Not Estab. % Final  . Basos 11/25/2016 0  Not Estab. % Final  . Neutrophils Absolute 11/25/2016 3.5  1.4 - 7.0 x10E3/uL Final  . Lymphocytes Absolute 11/25/2016 1.6  0.7 - 3.1 x10E3/uL Final  . Monocytes Absolute 11/25/2016 0.4  0.1 - 0.9 x10E3/uL Final  . EOS (ABSOLUTE) 11/25/2016 0.2  0.0 - 0.4 x10E3/uL Final  . Basophils Absolute 11/25/2016 0.0  0.0 - 0.2 x10E3/uL Final  . Immature Granulocytes 11/25/2016 0  Not Estab. % Final  . Immature Grans (Abs) 11/25/2016 0.0  0.0 - 0.1 x10E3/uL Final  . Specific Gravity, UA 11/25/2016 1.025  1.005 - 1.030 Final  . pH, UA 11/25/2016 5.5  5.0 - 7.5 Final  . Color, UA 11/25/2016 Yellow  Yellow Final  . Appearance Ur 11/25/2016 Clear  Clear Final  . Leukocytes, UA 11/25/2016 Negative  Negative Final  . Protein, UA 11/25/2016 Trace* Negative/Trace Final  . Glucose, UA 11/25/2016 Negative  Negative Final  . Ketones, UA 11/25/2016 Negative  Negative Final  . RBC, UA 11/25/2016 Negative  Negative Final  . Bilirubin, UA 11/25/2016 Negative  Negative Final  . Urobilinogen, Ur 11/25/2016 0.2  0.2 - 1.0 mg/dL Final  . Nitrite, UA 11/25/2016 Negative  Negative Final  . Microscopic Examination 11/25/2016 See below:   Final  . WBC, UA 11/25/2016 0-5  0 - 5 /hpf Final  . RBC, UA 11/25/2016 None seen  0 - 2 /hpf Final  . Epithelial Cells (non renal) 11/25/2016 None seen  0 - 10 /hpf Final  . Bacteria, UA 11/25/2016 None seen  None seen/Few  Final    Physical Examination:  BP 138/80   Pulse 66   Ht 6' (1.829 m)   Wt 222 lb (100.7 kg)   SpO2 97%   BMI 30.11 kg/m   GENERAL:         Patient has Abdominal obesity.   HEENT:         Eye exam shows normal external appearance. Fundus exam shows no retinopathy.  Oral exam shows normal mucosa .  NECK:   There is no lymphadenopathy Thyroid is not enlarged and no nodules felt.  Carotids are normal to palpation and no bruit heard LUNGS:         Chest is symmetrical. Lungs are clear to auscultation.Marland Kitchen   HEART:         Heart sounds:  S1 and S2 are normal. No murmur or click heard., no S3 or S4.   ABDOMEN:   There is no distention present. Liver and spleen are not palpable. No other mass or tenderness present.   NEUROLOGICAL:   Ankle jerks are absent bilaterally, Biceps reflexes slightly brisk .    Diabetic Foot Exam - Simple   Simple Foot Form Diabetic Foot exam was performed with the following findings:  Yes   Visual Inspection No deformities, no ulcerations, no other skin breakdown bilaterally:  Yes Sensation Testing Intact to touch and monofilament testing bilaterally:  Yes Pulse Check Posterior Tibialis and Dorsalis pulse intact bilaterally:  Yes Comments            Vibration sense is Mildly reduced in distal first toes. MUSCULOSKELETAL:  There is no swelling or deformity of the peripheral joints. Spine is normal to inspection.   EXTREMITIES:     There is no edema. No skin lesions present.Marland Kitchen SKIN:       No rash or lesions of concern.        ASSESSMENT:  Diabetes type 2, uncontrolled     He has been on insulin alone for the last few years Although he has history of severe obesity has lost weight over the last few years and is not requiring large doses of insulin Also has inadequate frequency and timing of glucose monitoring at home Currently using an old beer monitor and is not using inappropriate lancet device also  His A1c is consistently over 7% and as discussed  above in detail this is from his postprandial hyperglycemia He is not taking insulin doses for his meals on time or adjusting them based on his postprandial blood sugars Complications of diabetes: Erectile dysfunction, mild neuropathy,?  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  Given him a new Contour next glucose monitor and explained to him that he needs to label his postprandial readings  Discussed blood sugar targets at various times  He needs to adjust his Humalog to keep his postprandial readings at least under 180  For now he will need to take at least 12 units of Humalog for breakfast since he is consistently having high readings after meals in the morning  He will also see what his blood sugars are after lunch and supper to adjust the Humalog as needed  He will start taking his Humalog 5-10 minutes before eating rather than postprandially  He was given the option of using the [employer] brand regular insulin with a syringe that would be much less expensive and would be given 15-30 minutes before the meal and the same doses but he wants to wait to do this for now; he could do this on the days he is eating at home more conveniently  No change in Lantus  Follow-up in 6 weeks to review his blood sugar patterns  Patient Instructions  Check blood sugars on waking up  3x weekly  Also check blood sugars about 2 hours after a meal and do this after different meals by rotation  Recommended blood sugar levels on waking up is 90-130 and about 2 hours after meal is 130-160  Please bring your blood sugar monitor to each visit, thank you  Humalog BEFORE meals, 5-10 min before  Take 12  before Bkfst  RELION REGULAR INSULIN 30 min before      Counseling time on subjects discussed above is over 50% of today's 60 minute visit   Consultation note has been sent to the referring physician  [facility] 01/26/2017, 1:29 PM   Note: This office note was prepared with Dragon voice recognition system technology. Any transcriptional errors that result from this process are unintentional.

## 2017-01-26 ENCOUNTER — Ambulatory Visit (INDEPENDENT_AMBULATORY_CARE_PROVIDER_SITE_OTHER): Payer: Medicare Other | Admitting: Endocrinology

## 2017-01-26 ENCOUNTER — Encounter: Payer: Self-pay | Admitting: Endocrinology

## 2017-01-26 ENCOUNTER — Other Ambulatory Visit: Payer: Self-pay

## 2017-01-26 VITALS — BP 138/80 | HR 66 | Ht 72.0 in | Wt 222.0 lb

## 2017-01-26 DIAGNOSIS — Z794 Long term (current) use of insulin: Secondary | ICD-10-CM | POA: Diagnosis not present

## 2017-01-26 DIAGNOSIS — E1165 Type 2 diabetes mellitus with hyperglycemia: Secondary | ICD-10-CM

## 2017-01-26 LAB — GLUCOSE, POCT (MANUAL RESULT ENTRY): POC Glucose: 259 mg/dl — AB (ref 70–99)

## 2017-01-26 MED ORDER — GLUCOSE BLOOD VI STRP: ORAL_STRIP | 2 refills | Status: DC

## 2017-01-26 MED ORDER — BAYER MICROLET LANCETS MISC: 2 refills | Status: DC

## 2017-01-26 NOTE — Patient Instructions (Signed)
Check blood sugars on waking up  3x weekly  Also check blood sugars about 2 hours after a meal and do this after different meals by rotation  Recommended blood sugar levels on waking up is 90-130 and about 2 hours after meal is 130-160  Please bring your blood sugar monitor to each visit, thank you  Humalog BEFORE meals, 5-10 min before  Take 12 before Bkfst  RELION REGULAR INSULIN 30 min before

## 2017-02-02 DIAGNOSIS — M1812 Unilateral primary osteoarthritis of first carpometacarpal joint, left hand: Secondary | ICD-10-CM | POA: Diagnosis not present

## 2017-02-04 ENCOUNTER — Other Ambulatory Visit: Payer: Self-pay | Admitting: Family Medicine

## 2017-02-04 NOTE — Telephone Encounter (Signed)
Your patient 

## 2017-02-18 ENCOUNTER — Telehealth: Payer: Self-pay | Admitting: Endocrinology

## 2017-02-18 ENCOUNTER — Telehealth: Payer: Self-pay | Admitting: Family Medicine

## 2017-02-18 NOTE — Telephone Encounter (Signed)
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 Dwyane Dee is not certified. Paperwork placed at the front.

## 2017-02-18 NOTE — Telephone Encounter (Signed)
Patient called in regards to getting samples for Insulin Glargine (LANTUS SOLOSTAR) 100 UNIT/ML Solostar Pen and insulin lispro (HUMALOG KWIKPEN) 100 UNIT/ML KiwkPen.   Thank you

## 2017-02-18 NOTE — Telephone Encounter (Signed)
Called pt to let him know that we do have a Humalog for him, but not a Lantus. Pt did not answer. VM full! Humalog label in fridge for pt.

## 2017-02-19 ENCOUNTER — Ambulatory Visit: Payer: Medicare Other

## 2017-02-19 ENCOUNTER — Encounter: Payer: Self-pay | Admitting: Family Medicine

## 2017-02-19 ENCOUNTER — Ambulatory Visit (INDEPENDENT_AMBULATORY_CARE_PROVIDER_SITE_OTHER): Payer: Medicare Other | Admitting: Family Medicine

## 2017-02-19 VITALS — BP 155/89 | HR 55 | Temp 98.3°F | Wt 224.0 lb

## 2017-02-19 DIAGNOSIS — M5442 Lumbago with sciatica, left side: Secondary | ICD-10-CM | POA: Diagnosis not present

## 2017-02-19 MED ORDER — PREDNISONE 10 MG PO TABS
ORAL_TABLET | ORAL | 0 refills | Status: DC
Start: 2017-02-19 — End: 2017-03-06

## 2017-02-19 MED ORDER — TRIAMCINOLONE ACETONIDE 40 MG/ML IJ SUSP
40.0000 mg | Freq: Once | INTRAMUSCULAR | Status: AC
  Administered 2017-02-19: 40 mg via INTRAMUSCULAR

## 2017-02-19 MED ORDER — TRAMADOL HCL 50 MG PO TABS: 50.0000 mg | ORAL_TABLET | Freq: Four times a day (QID) | ORAL | 0 refills | Status: DC | PRN

## 2017-02-19 MED ORDER — CYCLOBENZAPRINE HCL 10 MG PO TABS: 10.0000 mg | ORAL_TABLET | Freq: Three times a day (TID) | ORAL | 0 refills | Status: DC | PRN

## 2017-02-19 NOTE — Telephone Encounter (Signed)
Patient completed appointment for 02/19/2017 and picked up samples.

## 2017-02-19 NOTE — Progress Notes (Signed)
   BP (!) 155/89   Pulse (!) 55   Temp 98.3 F (36.8 C)   Wt 224 lb (101.6 kg)   SpO2 97%   BMI 30.38 kg/m    Subjective:    Patient ID: Jack Estrada, male    DOB: 1945/10/25, 71 y.o.   MRN: [id number]  HPI: Jack Estrada is a 71 y.o. male  Chief Complaint  Patient presents with  . Back Pain    x 1 day, lower left side, radiating slightly down the top of his leg. was picking something up and felt it "give".   Left low back pain that runs down left posterior leg since yesterday when he was bending and moving a large cement block. Felt the pain instantly and has been in severe pain with any movement or weight bearing. Relieved by sitting still. Took some aleve which helped some. Had a similar episode about a year ago that lasted a few months. Denies fever, chills, incontinence, saddle paresthesias, leg weakness.   Relevant past medical, surgical, family and social history reviewed and updated as indicated. Interim medical history since our last visit reviewed. Allergies and medications reviewed and updated.  Review of Systems  Constitutional: Negative.   HENT: Negative.   Respiratory: Negative.   Cardiovascular: Negative.   Gastrointestinal: Negative.   Genitourinary: Negative.   Musculoskeletal: Positive for back pain and myalgias.  Neurological: Negative.   Psychiatric/Behavioral: Negative.     Per HPI unless specifically indicated above     Objective:    BP (!) 155/89   Pulse (!) 55   Temp 98.3 F (36.8 C)   Wt 224 lb (101.6 kg)   SpO2 97%   BMI 30.38 kg/m   Wt Readings from Last 3 Encounters:  02/19/17 224 lb (101.6 kg)  01/26/17 222 lb (100.7 kg)  11/25/16 228 lb 9.6 oz (103.7 kg)    Physical Exam  Constitutional: He is oriented to person, place, and time. He appears well-developed and well-nourished. No distress.  HENT:  Head: Atraumatic.  Eyes: Conjunctivae are normal. Pupils are equal, round, and reactive to light.  Neck: Normal range of motion. Neck  supple.  Cardiovascular: Regular rhythm and normal heart sounds.   Pulmonary/Chest: Effort normal and breath sounds normal. No respiratory distress.  Abdominal: Soft. Bowel sounds are normal. There is no tenderness.  Musculoskeletal: Normal range of motion. He exhibits tenderness (TTP left lower back).  - SLR  Neurological: He is alert and oriented to person, place, and time. No cranial nerve deficit.  Skin: Skin is warm and dry.  Psychiatric: He has a normal mood and affect. His behavior is normal.  Nursing note and vitals reviewed.     Assessment & Plan:   Problem List Items Addressed This Visit    None    Visit Diagnoses    Acute left-sided low back pain with left-sided sciatica    -  Primary   Kenalog IM given today. Will start prednisone taper and flexeril. Tramadol prn for pain given his upcoming travel this weekend. Strict precautions discussed   Relevant Medications   cyclobenzaprine (FLEXERIL) 10 MG tablet   predniSONE (DELTASONE) 10 MG tablet   traMADol (ULTRAM) 50 MG tablet   triamcinolone acetonide (KENALOG-40) injection 40 mg (Completed)       Follow up plan: Return if symptoms worsen or fail to improve.

## 2017-02-20 ENCOUNTER — Ambulatory Visit: Payer: Medicare Other

## 2017-02-23 ENCOUNTER — Telehealth: Payer: Self-pay | Admitting: Family Medicine

## 2017-02-23 NOTE — Telephone Encounter (Signed)
Jack Estrada needs to speak to someone who can answer some questions he has regarding his DOT physical.  He states he was sent to [facility] but the endocrinologist said he would need to have the physical done here. He understood when he was sent to [facility] the doctor there would be able to do his paperwork for his approval.  I know he is [name] patient but not sure if he needs to speak with [place] or [place].  Thanks  [phone]

## 2017-02-23 NOTE — Telephone Encounter (Signed)
I unfortunately do not know anything about DOTs. Will defer to [place] and/or [place].

## 2017-02-23 NOTE — Telephone Encounter (Signed)
Tried calling patient, no answer and VM box was full so I could not leave a message. Will try to call again later.

## 2017-02-24 NOTE — Telephone Encounter (Signed)
Tried calling patient again. No answer and VM box was full so I was unable to leave a VM. Will try to call again later.

## 2017-02-24 NOTE — Telephone Encounter (Signed)
Tried calling patient again. No answer and mailbox full so I was unable to leave a VM.

## 2017-02-26 NOTE — Telephone Encounter (Signed)
Tried calling patient but there was no answer and no VM set up.

## 2017-03-06 ENCOUNTER — Ambulatory Visit (INDEPENDENT_AMBULATORY_CARE_PROVIDER_SITE_OTHER): Payer: Self-pay | Admitting: Unknown Physician Specialty

## 2017-03-06 ENCOUNTER — Encounter: Payer: Self-pay | Admitting: Unknown Physician Specialty

## 2017-03-06 ENCOUNTER — Encounter: Payer: Medicare Other | Admitting: Unknown Physician Specialty

## 2017-03-06 VITALS — BP 134/77 | HR 67 | Temp 98.5°F | Ht 71.0 in | Wt 218.8 lb

## 2017-03-06 DIAGNOSIS — Z024 Encounter for examination for driving license: Secondary | ICD-10-CM

## 2017-03-06 NOTE — Progress Notes (Signed)
   BP 134/77 (BP Location: Left Arm, Cuff Size: Large)   Pulse 67   Temp 98.5 F (36.9 C)   Ht 5\' 11"  (1.803 m)   Wt 218 lb 12.8 oz (99.2 kg)   SpO2 97%   BMI 30.52 kg/m    Subjective:    Patient ID: Jack Estrada, male    DOB: [birth year], 71 y.o.   MRN: [id number]  HPI: Jack Estrada is a 71 y.o. male  Chief Complaint  Patient presents with  . 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  On insulin and has waiver signed by Endocrine.  Last Hgb A1C 7.6.  Blood sugar reading show no hypoglycemia.  Relevant past medical, surgical, family and social history reviewed and updated as indicated. Interim medical history since our last visit reviewed. Allergies and medications reviewed and updated.  Review of Systems  Per HPI unless specifically indicated above     Objective:    BP 134/77 (BP Location: Left Arm, Cuff Size: Large)   Pulse 67   Temp 98.5 F (36.9 C)   Ht 5\' 11"  (1.803 m)   Wt 218 lb 12.8 oz (99.2 kg)   SpO2 97%   BMI 30.52 kg/m   Wt Readings from Last 3 Encounters:  03/06/17 218 lb 12.8 oz (99.2 kg)  02/19/17 224 lb (101.6 kg)  01/26/17 222 lb (100.7 kg)    Physical Exam  See form Results for orders placed or performed in visit on 01/26/17  POCT glucose (manual entry)  Result Value Ref Range   POC Glucose 259 (A) 70 - 99 mg/dl      Assessment & Plan:   Problem List Items Addressed This Visit    None       Follow up plan: No Follow-up on file.

## 2017-03-09 ENCOUNTER — Ambulatory Visit: Payer: Medicare Other | Admitting: Family Medicine

## 2017-03-12 ENCOUNTER — Other Ambulatory Visit: Payer: Medicare Other

## 2017-03-16 ENCOUNTER — Ambulatory Visit: Payer: Medicare Other | Admitting: Endocrinology

## 2017-03-16 DIAGNOSIS — Z0289 Encounter for other administrative examinations: Secondary | ICD-10-CM

## 2017-03-16 NOTE — Progress Notes (Deleted)
Patient ID: Jack Estrada, male   DOB: 08-14-1946, 71 y.o.   MRN: [id number]            Reason for Appointment: Consultation for Type 2 Diabetes  Referring physician: Chrissman   History of Present Illness:          Date of diagnosis of type 2 diabetes mellitus:    1992      Background history:   He had been given metformin in the first few years of his diagnosis Was started on insulin in 7-8 years ago when his blood sugars were poorly controlled In the last few years with A1c has been relatively higher around 7.5-8.6 although he thinks that some point it was below 7% previously He was taking metformin also but this was stopped probably about a year ago when his kidney function was worse  About 2 years ago he had marked obesity and he started changing his diet significantly with cutting back on carbohydrates and fats and also exercising more regularly, he was able to lose weight and also reduce his insulin doses  Recent history:   INSULIN regimen is: Lantus 25 units daily, Humalog 10 units after eating  Non-insulin hypoglycemic drugs the patient is taking are: None  He has been referred here for persistently high A1c, recently 7.6  Current management, blood sugar patterns and problems identified:    he has difficulty affording his insulin because of being in Medicare and on his own he has cut back on his Lantus on the last 6-8 months, previously taking 50 units  He does not change his Humalog based on what he is eating and takes the same dose after every meal  He has never been told to take his insulin before eating  His blood sugars are reportedly around 200 after breakfast but does not know what they are after other meals  He does not check her sugar very often as he has a very old lancet device and meter and it is painful to check sugar  No symptoms of hyperglycemia even when he is more active  He travels frequently but tries to eat salads when he is going out to  eat  Recently has been able to maintain his weight loss        Side effects from medications have been: None  Compliance with the medical regimen: Fair Hypoglycemia:   none  Glucose monitoring:  done  ?  1 times a day         Glucometer:  contour       Blood Glucose readings by recall   PREMEAL Breakfast Lunch Dinner Bedtime  Overall   Glucose range: 120-130      Median:        POST-MEAL PC Breakfast PC Lunch PC Dinner  Glucose range: 200  ?  Median:      Self-care: The diet that the patient has been following is: tries to limit .     Meal times are:  Breakfast is at Lunch: Dinner:   Typical meal intake: Breakfast is Eggs and toast or grits, frequently eating salads at lunch, avoiding fried food, snacks may be peanuts               Dietician visit, most recent: Never               Exercise: walking in the evening about 45 minutes, 5 days a week  Weight history:  Wt Readings from Last 3 Encounters:  03/06/17  218 lb 12.8 oz (99.2 kg)  02/19/17 224 lb (101.6 kg)  01/26/17 222 lb (100.7 kg)    Glycemic control:  No results found for: HGBA1C Lab Results  Component Value Date   MICROALBUR 80 (H) 11/25/2016   LDLCALC 88 07/09/2015   CREATININE 1.35 (H) 11/25/2016   Lab Results  Component Value Date   MICRALBCREAT 30-300 (H) 11/25/2016    No results found for: FRUCTOSAMINE    Allergies as of 03/16/2017      Reactions   Benazepril Cough      Medication List       Accurate as of 03/16/17  1:09 PM. Always use your most recent med list.          amLODipine 10 MG tablet Commonly known as:  NORVASC TAKE 1 TABLET BY MOUTH EVERY DAY   BAYER MICROLET LANCETS lancets Use to check sugar twice a day   cyclobenzaprine 10 MG tablet Commonly known as:  FLEXERIL Take 1 tablet (10 mg total) by mouth 3 (three) times daily as needed for muscle spasms.   esomeprazole 20 MG capsule Commonly known as:  NEXIUM Take 20 mg by mouth every other day.   glucose blood test  strip Commonly known as:  BAYER CONTOUR TEST Use to check sugar twice a day   Insulin Glargine 100 UNIT/ML Solostar Pen Commonly known as:  LANTUS SOLOSTAR Inject 50 Units into the skin every morning.   insulin lispro 100 UNIT/ML KiwkPen Commonly known as:  HUMALOG KWIKPEN Inject 0.2-0.3 mLs (20-30 Units total) into the skin 3 (three) times daily with meals.   sildenafil 20 MG tablet Commonly known as:  REVATIO TAKE ONE TABLET DAILY AS NEEDED.   traMADol 50 MG tablet Commonly known as:  ULTRAM Take 1 tablet (50 mg total) by mouth every 6 (six) hours as needed.       Allergies:  Allergies  Allergen Reactions  . Benazepril Cough    Past Medical History:  Diagnosis Date  . Chronic kidney disease   . Hyperlipidemia   . Retinopathy due to secondary diabetes [facility])     Past Surgical History:  Procedure Laterality Date  . thumb surgery Left March 2016    Family History  Problem Relation Age of Onset  . Diabetes Mother   . Heart disease Father   . Stroke Father     Social History:  reports that he has never smoked. He has never used smokeless tobacco. He reports that he does not drink alcohol or use drugs.   Review of Systems  Lipid history: He has not been on any lipid-lowering drugs    Lab Results  Component Value Date   CHOL 151 11/25/2016   HDL 34 (L) 07/09/2015   LDLCALC 88 07/09/2015   TRIG 200 (H) 11/25/2016   CHOLHDL 4.8 07/09/2015           Hypertension: for years, Now taking only amlodipine 10 mg  Most recent eye exam was 11/17.  He had photocoagulation for macular edema ?  In 2008  Most recent foot exam: 5/18    LABS:  No visits with results within 1 Week(s) from this visit.  Latest known visit with results is:  Office Visit on 01/26/2017  Component Date Value Ref Range Status  . POC Glucose 01/26/2017 259* 70 - 99 mg/dl Final    Physical Examination:  There were no vitals taken for this visit.  GENERAL:         Patient has  Abdominal obesity.   HEENT:         Eye exam shows normal external appearance. Fundus exam shows no retinopathy.  Oral exam shows normal mucosa .  NECK:   There is no lymphadenopathy Thyroid is not enlarged and no nodules felt.  Carotids are normal to palpation and no bruit heard LUNGS:         Chest is symmetrical. Lungs are clear to auscultation.Marland Kitchen   HEART:         Heart sounds:  S1 and S2 are normal. No murmur or click heard., no S3 or S4.   ABDOMEN:   There is no distention present. Liver and spleen are not palpable. No other mass or tenderness present.   NEUROLOGICAL:   Ankle jerks are absent bilaterally, Biceps reflexes slightly brisk .    Diabetic Foot Exam - Simple   No data filed             Vibration sense is Mildly reduced in distal first toes. MUSCULOSKELETAL:  There is no swelling or deformity of the peripheral joints. Spine is normal to inspection.   EXTREMITIES:     There is no edema. No skin lesions present.Marland Kitchen SKIN:       No rash or lesions of concern.        ASSESSMENT:  Diabetes type 2, uncontrolled     He has been on insulin alone for the last few years Although he has history of severe obesity has lost weight over the last few years and is not requiring large doses of insulin Also has inadequate frequency and timing of glucose monitoring at home Currently using an old beer monitor and is not using inappropriate lancet device also  His A1c is consistently over 7% and as discussed above in detail this is from his postprandial hyperglycemia He is not taking insulin doses for his meals on time or adjusting them based on his postprandial blood sugars Complications of diabetes: Erectile dysfunction, mild neuropathy,?  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  Given him a new Contour next glucose monitor and explained to him that he needs to label his postprandial readings  Discussed blood sugar targets at various times  He needs to adjust his Humalog to keep his postprandial readings at least under 180  For now he will need to take at least 12 units of Humalog for breakfast since he is consistently having high readings after meals in the morning  He will also see what his blood sugars are after lunch and supper to adjust the Humalog as needed  He will start taking his Humalog 5-10 minutes before eating rather than postprandially  He was given the option of using the [employer] brand regular insulin with a syringe that would be much less expensive and would be given 15-30 minutes before the meal and the same doses but he wants to wait to do this for now; he could do this on the days he is eating at home more conveniently  No change in Lantus  Follow-up in 6 weeks to review his blood sugar patterns  There are no Patient Instructions on file for this visit. Counseling time on subjects discussed above is over  50% of today's 60 minute visit   Consultation note has been sent to the referring physician  [facility] 03/16/2017, 1:09 PM   Note: This office note was prepared with Dragon voice recognition system technology. Any transcriptional errors that result from this process are unintentional.

## 2017-03-19 ENCOUNTER — Telehealth: Payer: Self-pay

## 2017-03-19 NOTE — Telephone Encounter (Signed)
Called to reschedule missed AWV. Rescheduled for 03/27/2017, pt confirmed. Mailed appt reminder

## 2017-03-27 ENCOUNTER — Ambulatory Visit: Payer: Medicare Other

## 2017-04-03 ENCOUNTER — Telehealth: Payer: Self-pay

## 2017-04-03 NOTE — Telephone Encounter (Signed)
Left message to r/s missed AWV

## 2017-04-10 NOTE — Telephone Encounter (Signed)
Patient has no showed twice for AWV- unable to reach patient by phone. Mailed letter

## 2017-04-13 DIAGNOSIS — Z859 Personal history of malignant neoplasm, unspecified: Secondary | ICD-10-CM | POA: Diagnosis not present

## 2017-04-13 DIAGNOSIS — L570 Actinic keratosis: Secondary | ICD-10-CM | POA: Diagnosis not present

## 2017-04-13 DIAGNOSIS — Z85828 Personal history of other malignant neoplasm of skin: Secondary | ICD-10-CM | POA: Diagnosis not present

## 2017-04-24 ENCOUNTER — Other Ambulatory Visit: Payer: Self-pay | Admitting: Family Medicine

## 2017-04-24 NOTE — Telephone Encounter (Signed)
Last OV: 03/06/17 Next OV: none on file.   Lab Results  Component Value Date   CHOL 151 11/25/2016   HDL 34 (L) 07/09/2015   LDLCALC 88 07/09/2015   TRIG 200 (H) 11/25/2016   CHOLHDL 4.8 07/09/2015   Lab Results  Component Value Date   CREATININE 1.35 (H) 11/25/2016   BUN 26 11/25/2016   NA 141 11/25/2016   K 4.4 11/25/2016   CL 104 11/25/2016   CO2 24 11/25/2016

## 2017-04-27 ENCOUNTER — Telehealth: Payer: Self-pay | Admitting: Family Medicine

## 2017-04-27 NOTE — Telephone Encounter (Signed)
We do have a Humalog sample. Has pt's name on it in [place]. Left message to make pt aware.

## 2017-04-27 NOTE — Telephone Encounter (Signed)
Patient would like samples of humalog or lantus he was not sure of the strength  Please advise  Thanks  [phone]

## 2017-04-28 ENCOUNTER — Ambulatory Visit (INDEPENDENT_AMBULATORY_CARE_PROVIDER_SITE_OTHER): Payer: Medicare Other | Admitting: Family Medicine

## 2017-04-28 ENCOUNTER — Encounter: Payer: Self-pay | Admitting: Family Medicine

## 2017-04-28 VITALS — BP 159/89 | HR 54 | Temp 98.6°F | Wt 226.0 lb

## 2017-04-28 DIAGNOSIS — R300 Dysuria: Secondary | ICD-10-CM | POA: Diagnosis not present

## 2017-04-28 DIAGNOSIS — G8929 Other chronic pain: Secondary | ICD-10-CM | POA: Diagnosis not present

## 2017-04-28 DIAGNOSIS — M5442 Lumbago with sciatica, left side: Secondary | ICD-10-CM

## 2017-04-28 LAB — UA/M W/RFLX CULTURE, ROUTINE
BILIRUBIN UA: NEGATIVE
GLUCOSE, UA: NEGATIVE
LEUKOCYTES UA: NEGATIVE
Nitrite, UA: NEGATIVE
PH UA: 5 (ref 5.0–7.5)
SPEC GRAV UA: 1.02 (ref 1.005–1.030)
UUROB: 0.2 mg/dL (ref 0.2–1.0)

## 2017-04-28 LAB — MICROSCOPIC EXAMINATION
Bacteria, UA: NONE SEEN
RBC, UA: NONE SEEN /hpf (ref 0–?)

## 2017-04-28 MED ORDER — KETOROLAC TROMETHAMINE 60 MG/2ML IM SOLN
60.0000 mg | Freq: Once | INTRAMUSCULAR | Status: AC
  Administered 2017-04-28: 60 mg via INTRAMUSCULAR

## 2017-04-28 MED ORDER — CYCLOBENZAPRINE HCL 10 MG PO TABS: 10.0000 mg | ORAL_TABLET | Freq: Three times a day (TID) | ORAL | 0 refills | Status: DC | PRN

## 2017-04-28 MED ORDER — PREDNISONE 10 MG PO TABS: ORAL_TABLET | ORAL | 0 refills | Status: DC

## 2017-04-28 NOTE — Progress Notes (Signed)
BP (!) 159/89   Pulse (!) 54   Temp 98.6 F (37 C)   Wt 226 lb (102.5 kg)   SpO2 96%   BMI 31.52 kg/m    Subjective:    Patient ID: Jack Estrada, male    DOB: 01-02-1946, 71 y.o.   MRN: [id number]  HPI: Jack Estrada is a 71 y.o. male  Chief Complaint  Patient presents with  . Back Pain    gotten worse since he was here for in June. Radiating into left groin.   Patient presents with another severe flare of left sided lbp with sciatica running down left lower leg. States his previous flare improved on prednisone and flexeril but returned shortly after coming off. These issues started about a year and a half ago for him. Travels frequently for work and notes this makes things much worse with the long car and plane rides. Taking lots of OTC NSAIDs with some daytime relief. Evenings and overnights are the worst. Denies fever, chills, saddle paresthesias, incontinence. Does note when asked that he is having some dysuria lately.   Relevant past medical, surgical, family and social history reviewed and updated as indicated. Interim medical history since our last visit reviewed. Allergies and medications reviewed and updated.  Review of Systems  Constitutional: Negative.   Respiratory: Negative.   Cardiovascular: Negative.   Gastrointestinal: Negative.   Genitourinary: Positive for dysuria.  Musculoskeletal: Positive for back pain.  Neurological: Positive for numbness.  Psychiatric/Behavioral: Negative.    Per HPI unless specifically indicated above     Objective:    BP (!) 159/89   Pulse (!) 54   Temp 98.6 F (37 C)   Wt 226 lb (102.5 kg)   SpO2 96%   BMI 31.52 kg/m   Wt Readings from Last 3 Encounters:  04/28/17 226 lb (102.5 kg)  03/06/17 218 lb 12.8 oz (99.2 kg)  02/19/17 224 lb (101.6 kg)    Physical Exam  Constitutional: He is oriented to person, place, and time. He appears well-developed and well-nourished. No distress.  HENT:  Head: Atraumatic.  Eyes:  Conjunctivae are normal. No scleral icterus.  Neck: Normal range of motion. Neck supple.  Cardiovascular: Normal rate and normal heart sounds.   Pulmonary/Chest: Effort normal. No respiratory distress.  Musculoskeletal: Normal range of motion. He exhibits tenderness (Mild ttp over left low back down into hip).  - SLR Antalgic gait  Neurological: He is alert and oriented to person, place, and time.  Skin: Skin is warm and dry.  Psychiatric: He has a normal mood and affect. His behavior is normal.  Nursing note and vitals reviewed.     Assessment & Plan:   Problem List Items Addressed This Visit    None    Visit Diagnoses    Chronic left-sided low back pain with left-sided sciatica    -  Primary   IM toradol today as he is about to go on a long car ride, prednisone taper, flexeril QHS. Will get x-ray and refer to orthopedics as this is a persistent issue    Relevant Medications   predniSONE (DELTASONE) 10 MG tablet   cyclobenzaprine (FLEXERIL) 10 MG tablet   ketorolac (TORADOL) injection 60 mg (Completed) (Start on 04/28/2017  9:15 AM)   Other Relevant Orders   DG Lumbar Spine Complete   AMB referral to orthopedics   Dysuria       Await results, continue good hydration   Relevant Orders   UA/M w/rflx  Culture, Routine       Follow up plan: Return for Orthopedic consult.

## 2017-04-28 NOTE — Patient Instructions (Signed)
Follow up with orthopedics

## 2017-04-29 ENCOUNTER — Telehealth: Payer: Self-pay | Admitting: Family Medicine

## 2017-04-29 NOTE — Telephone Encounter (Signed)
Please call and let him know that his urine did come back normal. Proceed as discussed yesterday with x-ray and orthopedic consult

## 2017-04-29 NOTE — Telephone Encounter (Signed)
Left message to call.

## 2017-04-30 NOTE — Telephone Encounter (Signed)
Patient notified

## 2017-04-30 NOTE — Telephone Encounter (Signed)
Left message to call.

## 2017-05-04 ENCOUNTER — Telehealth: Payer: Self-pay | Admitting: Family Medicine

## 2017-06-11 ENCOUNTER — Other Ambulatory Visit: Payer: Self-pay | Admitting: Family Medicine

## 2017-06-11 DIAGNOSIS — E119 Type 2 diabetes mellitus without complications: Secondary | ICD-10-CM

## 2017-06-12 ENCOUNTER — Telehealth: Payer: Self-pay | Admitting: Family Medicine

## 2017-06-12 NOTE — Telephone Encounter (Signed)
Patient would like to have some samples of humalog and lantus if we have any and he also needs a new script for them per pharmacy  Thanks  [phone]

## 2017-06-12 NOTE — Telephone Encounter (Signed)
We do not have any of those samples. Request for refills has been submitted to provider.

## 2017-06-12 NOTE — Telephone Encounter (Signed)
Spoke with Jack Estrada and told him.  He is asking to be called if we do get any samples in. Just FYI  Thank You  [phone]

## 2017-06-17 ENCOUNTER — Telehealth: Payer: Self-pay | Admitting: Family Medicine

## 2017-06-17 NOTE — Telephone Encounter (Signed)
Patient called to see if there were any samples for the humalog and lantus.  He also needs to know who he was referred to for his back issues as he has lost the information.  [phone]  Thanks

## 2017-06-18 NOTE — Telephone Encounter (Signed)
Attempted to reach pt. No answer. VMbox was full.

## 2017-06-18 NOTE — Telephone Encounter (Signed)
We do have a Lantus for the patient. No humalog. Will call and let him know after 9 a.m.

## 2017-06-18 NOTE — Telephone Encounter (Signed)
Spoke with Hammad gave him the information regarding referral and also told him about the medication samples.  He will be in today to pick up the samples.  He was very thankful  Thanks

## 2017-06-18 NOTE — Telephone Encounter (Signed)
Pt was referred to:   [facility] [address] [place],  [place]  [address] Main: [phone]  Fax: [phone]

## 2017-07-09 ENCOUNTER — Telehealth: Payer: Self-pay | Admitting: Family Medicine

## 2017-07-09 NOTE — Telephone Encounter (Signed)
Copied from [place] #[id number]. Topic: Quick Communication - See Telephone Encounter >> Jul 09, 2017  3:57 PM Robina Ade, Helene Kelp D wrote: CRM for notification. See Telephone encounter for: 07/09/17. Patient called and would like to know if the practice has samples of HUMALOG KWIKPEN 100 UNIT/ML KiwkPen and LANTUS SOLOSTAR 100 UNIT/ML Solostar Pen.

## 2017-07-09 NOTE — Telephone Encounter (Signed)
1 of each label in fridge for patient to pick up. Patient notified. Will come in the morning to collect.

## 2017-07-30 ENCOUNTER — Telehealth: Payer: Self-pay | Admitting: Family Medicine

## 2017-07-30 NOTE — Telephone Encounter (Signed)
See message for samples and Prednisone

## 2017-07-30 NOTE — Telephone Encounter (Signed)
Copied from [place]. Topic: Quick Communication - See Telephone Encounter >> Jul 30, 2017 11:47 AM Ahmed Prima L wrote: CRM for notification. See Telephone encounter for:  Patient wants to know if the office has any samples of the lantus & humalog samples if he could, he said that he is out. [phone] Patient said he hurt his back & he wants to know if he can get another refill also on the prednisone.   07/30/17.

## 2017-07-31 NOTE — Telephone Encounter (Signed)
Message relayed to patient. Pt stated he had a slipped disc and needed prednisone, but would get another provider to prescribed. He Verbalized understanding and denied questions.

## 2017-07-31 NOTE — Telephone Encounter (Signed)
Prednisone as big-time medication and not appropriate for casual use over the phone. Patient can use Advil and Aleve Tylenol hot and cold compresses etc.

## 2017-07-31 NOTE — Telephone Encounter (Signed)
See note

## 2017-07-31 NOTE — Telephone Encounter (Signed)
We do not have any samples currently. Will call pt to notify. Please advise on Prednisone.

## 2017-08-13 ENCOUNTER — Telehealth: Payer: Self-pay | Admitting: Family Medicine

## 2017-08-13 NOTE — Telephone Encounter (Signed)
Pt aware.

## 2017-08-13 NOTE — Telephone Encounter (Signed)
Copied from [place] [phone]. Topic: General - Other >> Aug 13, 2017  9:38 AM Yvette Rack wrote: Reason for CRM: med refill on Samples of Lantus and Humalog until he gets his medicare in January he states that he need the Lantus more he's almost out with this medication

## 2017-08-13 NOTE — Telephone Encounter (Signed)
Unfortunately we do not have samples of either at this time.

## 2017-08-18 ENCOUNTER — Telehealth: Payer: Self-pay | Admitting: Family Medicine

## 2017-08-18 DIAGNOSIS — M545 Low back pain: Principal | ICD-10-CM

## 2017-08-18 DIAGNOSIS — G8929 Other chronic pain: Secondary | ICD-10-CM

## 2017-08-18 NOTE — Telephone Encounter (Signed)
Looks like he's your patient.

## 2017-08-18 NOTE — Telephone Encounter (Signed)
Copied from [place] [phone]. Topic: Referral - Request >> Aug 18, 2017 11:33 AM Marin Olp L wrote: Reason for CRM: Patient want to resubmit the referral to ortho for back pain and hip pain. One was placed by [facility] in august but he never went. He would like to go now.

## 2017-08-19 NOTE — Telephone Encounter (Signed)
done

## 2017-09-03 NOTE — Telephone Encounter (Signed)
PT calling again to see if we have samples yet of lantus and humalog

## 2017-09-04 ENCOUNTER — Telehealth: Payer: Self-pay | Admitting: Family Medicine

## 2017-09-04 NOTE — Telephone Encounter (Signed)
Humalog sample logged out and placed in fridge for pickup. Pt aware.

## 2017-09-04 NOTE — Telephone Encounter (Signed)
Pt would like a sample of humalog. Please advise.

## 2017-09-24 DIAGNOSIS — M25551 Pain in right hip: Secondary | ICD-10-CM | POA: Diagnosis not present

## 2017-09-25 ENCOUNTER — Other Ambulatory Visit: Payer: Self-pay | Admitting: Physical Medicine and Rehabilitation

## 2017-09-25 DIAGNOSIS — M5416 Radiculopathy, lumbar region: Secondary | ICD-10-CM | POA: Diagnosis not present

## 2017-09-25 DIAGNOSIS — M5126 Other intervertebral disc displacement, lumbar region: Secondary | ICD-10-CM | POA: Diagnosis not present

## 2017-10-01 ENCOUNTER — Ambulatory Visit
Admission: RE | Admit: 2017-10-01 | Discharge: 2017-10-01 | Disposition: A | Discharge status: Home / Self Care | Payer: Medicare Other | Source: Ambulatory Visit | Attending: Physical Medicine and Rehabilitation | Admitting: Physical Medicine and Rehabilitation

## 2017-10-01 DIAGNOSIS — M7138 Other bursal cyst, other site: Secondary | ICD-10-CM | POA: Insufficient documentation

## 2017-10-01 DIAGNOSIS — M48061 Spinal stenosis, lumbar region without neurogenic claudication: Secondary | ICD-10-CM | POA: Diagnosis not present

## 2017-10-01 DIAGNOSIS — M5416 Radiculopathy, lumbar region: Secondary | ICD-10-CM

## 2017-10-13 ENCOUNTER — Other Ambulatory Visit: Payer: Self-pay | Admitting: Family Medicine

## 2017-10-13 MED ORDER — SILDENAFIL CITRATE 20 MG PO TABS: 20.0000 mg | ORAL_TABLET | Freq: Every day | ORAL | 12 refills | Status: DC | PRN

## 2017-10-13 NOTE — Telephone Encounter (Signed)
Copied from [place]. Topic: Quick Communication - Rx Refill/Question >> Oct 13, 2017 12:28 PM Robina Ade, Helene Kelp D wrote: Medication: sildenafil (REVATIO) 20 MG tablet   Has the patient contacted their pharmacy? No   (Agent: If no, request that the patient contact the pharmacy for the refill.)   Preferred Pharmacy (with phone number or street name): [place], [place] [address]   Agent: Please be advised that RX refills may take up to 3 business days. We ask that you follow-up with your pharmacy.

## 2017-10-13 NOTE — Telephone Encounter (Signed)
Sildenafil 20 mg refill  Last OV: 04/28/17 (problem visit- not addressed)    Pharmacy: requesting new pharmacy [facility] / [place]

## 2017-10-20 DIAGNOSIS — M48062 Spinal stenosis, lumbar region with neurogenic claudication: Secondary | ICD-10-CM | POA: Diagnosis not present

## 2017-10-20 DIAGNOSIS — M5416 Radiculopathy, lumbar region: Secondary | ICD-10-CM | POA: Diagnosis not present

## 2017-10-20 DIAGNOSIS — M47816 Spondylosis without myelopathy or radiculopathy, lumbar region: Secondary | ICD-10-CM | POA: Diagnosis not present

## 2017-10-20 DIAGNOSIS — M5136 Other intervertebral disc degeneration, lumbar region: Secondary | ICD-10-CM | POA: Diagnosis not present

## 2017-10-22 LAB — HM DIABETES EYE EXAM

## 2017-10-28 ENCOUNTER — Telehealth: Payer: Self-pay | Admitting: Family Medicine

## 2017-10-28 ENCOUNTER — Telehealth: Payer: Self-pay

## 2017-10-28 NOTE — Telephone Encounter (Signed)
Patient would like to switch pharmacy to [facility] in [place] please.   All of his medications need to be switched there  Thanks

## 2017-10-29 NOTE — Telephone Encounter (Signed)
Pharmacy updated in chart. Refills will be sent over once pt is seen for OV.

## 2017-11-11 DIAGNOSIS — E113393 Type 2 diabetes mellitus with moderate nonproliferative diabetic retinopathy without macular edema, bilateral: Secondary | ICD-10-CM | POA: Diagnosis not present

## 2017-11-13 ENCOUNTER — Other Ambulatory Visit: Payer: Self-pay | Admitting: Family Medicine

## 2017-11-13 DIAGNOSIS — E119 Type 2 diabetes mellitus without complications: Secondary | ICD-10-CM

## 2017-12-21 ENCOUNTER — Ambulatory Visit: Payer: Medicare Other | Admitting: Family Medicine

## 2018-01-05 ENCOUNTER — Other Ambulatory Visit: Payer: Self-pay | Admitting: Family Medicine

## 2018-01-05 DIAGNOSIS — E119 Type 2 diabetes mellitus without complications: Secondary | ICD-10-CM

## 2018-01-07 DIAGNOSIS — L570 Actinic keratosis: Secondary | ICD-10-CM | POA: Diagnosis not present

## 2018-01-14 ENCOUNTER — Ambulatory Visit: Payer: Medicare Other | Admitting: Family Medicine

## 2018-01-14 ENCOUNTER — Ambulatory Visit: Payer: Medicare Other

## 2018-01-28 ENCOUNTER — Encounter (INDEPENDENT_AMBULATORY_CARE_PROVIDER_SITE_OTHER): Payer: Self-pay | Admitting: Ophthalmology

## 2018-02-02 ENCOUNTER — Telehealth: Payer: Self-pay | Admitting: Family Medicine

## 2018-02-02 ENCOUNTER — Other Ambulatory Visit: Payer: Self-pay | Admitting: Family Medicine

## 2018-02-02 DIAGNOSIS — E119 Type 2 diabetes mellitus without complications: Secondary | ICD-10-CM

## 2018-02-02 NOTE — Telephone Encounter (Signed)
Copied from [place] [phone]. Topic: Quick Communication - Rx Refill/Question >> Feb 02, 2018 12:26 PM Rutherford Nail, NT wrote: Medication: HUMALOG KWIKPEN 100 UNIT/ML KiwkPen  Has the patient contacted their pharmacy? Yes.   (Agent: If no, request that the patient contact the pharmacy for the refill.) (Agent: If yes, when and what did the pharmacy advise?)  Preferred Pharmacy (with phone number or street name): [id number] - [place], [place]: Please be advised that RX refills may take up to 3 business days. We ask that you follow-up with your pharmacy.

## 2018-02-02 NOTE — Telephone Encounter (Signed)
Copied from [place] [phone]. Topic: Quick Communication - See Telephone Encounter >> Feb 02, 2018 12:31 PM Rutherford Nail, [place] wrote: CRM for notification. See Telephone encounter for: 02/02/18. Patient would like to know if there are any samples at the office that he could come pick up of the humalog insulin. States that he is completely out. Please advise. CB#: [phone]

## 2018-02-02 NOTE — Telephone Encounter (Signed)
Sample signed out. Called and let patient know that it was ready to be picked up.  °

## 2018-02-02 NOTE — Telephone Encounter (Signed)
See 2nd telephone encounter  

## 2018-02-15 DIAGNOSIS — L570 Actinic keratosis: Secondary | ICD-10-CM | POA: Diagnosis not present

## 2018-02-17 ENCOUNTER — Telehealth: Payer: Self-pay | Admitting: Family Medicine

## 2018-02-17 NOTE — Telephone Encounter (Signed)
Pt came to the office.

## 2018-02-17 NOTE — Telephone Encounter (Signed)
Copied from [place]. Topic: Quick Communication - Rx Refill/Question >> Feb 17, 2018  3:34 PM Scherrie Gerlach wrote: Medication: HUMALOG KWIKPEN 100 UNIT/ML KiwkPen Pt states he is leaving in an hour to go out of town and needs now, or a sample. Pt states he is coming by the office, as I could not promise this could be done before he leaves in a hour.  [facility] [place], [place] [phone] (Phone) [phone] (Fax)

## 2018-02-22 ENCOUNTER — Encounter: Payer: Self-pay | Admitting: Family Medicine

## 2018-02-22 ENCOUNTER — Ambulatory Visit (INDEPENDENT_AMBULATORY_CARE_PROVIDER_SITE_OTHER): Payer: Medicare Other | Admitting: Family Medicine

## 2018-02-22 DIAGNOSIS — R351 Nocturia: Secondary | ICD-10-CM | POA: Diagnosis not present

## 2018-02-22 DIAGNOSIS — Z1211 Encounter for screening for malignant neoplasm of colon: Secondary | ICD-10-CM | POA: Diagnosis not present

## 2018-02-22 DIAGNOSIS — Z794 Long term (current) use of insulin: Secondary | ICD-10-CM | POA: Diagnosis not present

## 2018-02-22 DIAGNOSIS — E119 Type 2 diabetes mellitus without complications: Secondary | ICD-10-CM

## 2018-02-22 DIAGNOSIS — E113299 Type 2 diabetes mellitus with mild nonproliferative diabetic retinopathy without macular edema, unspecified eye: Secondary | ICD-10-CM | POA: Diagnosis not present

## 2018-02-22 DIAGNOSIS — N401 Enlarged prostate with lower urinary tract symptoms: Secondary | ICD-10-CM

## 2018-02-22 DIAGNOSIS — I10 Essential (primary) hypertension: Secondary | ICD-10-CM | POA: Diagnosis not present

## 2018-02-22 DIAGNOSIS — Z1159 Encounter for screening for other viral diseases: Secondary | ICD-10-CM | POA: Diagnosis not present

## 2018-02-22 DIAGNOSIS — M189 Osteoarthritis of first carpometacarpal joint, unspecified: Secondary | ICD-10-CM | POA: Insufficient documentation

## 2018-02-22 DIAGNOSIS — E7800 Pure hypercholesterolemia, unspecified: Secondary | ICD-10-CM | POA: Diagnosis not present

## 2018-02-22 LAB — LP+ALT+AST PICCOLO, WAIVED
ALT (SGPT) PICCOLO, WAIVED: 31 U/L (ref 10–47)
AST (SGOT) PICCOLO, WAIVED: 32 U/L (ref 11–38)
CHOLESTEROL PICCOLO, WAIVED: 210 mg/dL — AB (ref <200)
Chol/HDL Ratio Piccolo,Waive: 5.2 mg/dL — ABNORMAL HIGH
HDL Chol Piccolo, Waived: 40 mg/dL — ABNORMAL LOW (ref >59)
LDL Chol Calc Piccolo Waived: 139 mg/dL — ABNORMAL HIGH (ref <100)
TRIGLYCERIDES PICCOLO,WAIVED: 157 mg/dL — AB (ref <150)
VLDL Chol Calc Piccolo,Waive: 31 mg/dL — ABNORMAL HIGH (ref <30)

## 2018-02-22 LAB — MICROALBUMIN, URINE WAIVED
Creatinine, Urine Waived: 300 mg/dL (ref 10–300)
Microalb, Ur Waived: 150 mg/L — ABNORMAL HIGH (ref 0–19)

## 2018-02-22 LAB — BAYER DCA HB A1C WAIVED: HB A1C: 8.1 % — AB (ref <7.0)

## 2018-02-22 MED ORDER — AMLODIPINE BESYLATE 5 MG PO TABS: 5.0000 mg | ORAL_TABLET | Freq: Every day | ORAL | 2 refills | Status: DC

## 2018-02-22 MED ORDER — ATORVASTATIN CALCIUM 40 MG PO TABS
40.0000 mg | ORAL_TABLET | Freq: Every day | ORAL | 2 refills | Status: DC
Start: 2018-02-22 — End: 2019-03-01

## 2018-02-22 MED ORDER — INSULIN GLARGINE 100 UNIT/ML SOLOSTAR PEN: PEN_INJECTOR | SUBCUTANEOUS | 0 refills | Status: DC

## 2018-02-22 MED ORDER — INSULIN LISPRO 100 UNIT/ML (KWIKPEN): PEN_INJECTOR | SUBCUTANEOUS | 0 refills | Status: DC

## 2018-02-22 NOTE — Progress Notes (Signed)
There were no vitals taken for this visit.   Subjective:    Patient ID: Jack Estrada, male    DOB: 21-Mar-1946, 72 y.o.   MRN: [id number]  HPI: Jack Estrada is a 72 y.o. male  Chief Complaint  Patient presents with  . Follow-up  . Hypertension  . Eye Problem    Right eye worsening. Does not want to see [facility] or Dr. Zigmund Daniel   Patient with multiple concerns been lost to follow-up for over a year for diabetes.  Using Humalog and Lantus without problems or fail prescriptions have run out but has taken faithfully over this last year. Not on any blood pressure medicines his blood pressures been doing fine and when taking medicines blood pressure got too low. Not taking any medicines for cholesterol. Taking Nexium without problems and cannot do without it.  Relevant past medical, surgical, family and social history reviewed and updated as indicated. Interim medical history since our last visit reviewed. Allergies and medications reviewed and updated.  Review of Systems  Constitutional: Negative.   Respiratory: Negative.   Cardiovascular: Negative.     Per HPI unless specifically indicated above     Objective:    There were no vitals taken for this visit.  Wt Readings from Last 3 Encounters:  04/28/17 226 lb (102.5 kg)  03/06/17 218 lb 12.8 oz (99.2 kg)  02/19/17 224 lb (101.6 kg)    Physical Exam  Constitutional: He is oriented to person, place, and time. He appears well-developed and well-nourished.  HENT:  Head: Normocephalic and atraumatic.  Eyes: Conjunctivae and EOM are normal.  Neck: Normal range of motion.  Cardiovascular: Normal rate, regular rhythm and normal heart sounds.  Pulmonary/Chest: Effort normal and breath sounds normal.  Musculoskeletal: Normal range of motion.  Neurological: He is alert and oriented to person, place, and time.  Skin: No erythema.  Psychiatric: He has a normal mood and affect. His behavior is normal. Judgment and thought  content normal.    Results for orders placed or performed in visit on 11/16/17  HM DIABETES EYE EXAM  Result Value Ref Range   HM Diabetic Eye Exam Retinopathy (A) No Retinopathy      Assessment & Plan:   Problem List Items Addressed This Visit      Cardiovascular and Mediastinum   Essential hypertension - Primary    Blood pressure elevated here in the office patient's going to be doing better with lifestyle diet exercise nutrition will follow blood pressure and if not coming down will notify us to get back on medications.      Relevant Medications   atorvastatin (LIPITOR) 40 MG tablet   amLODipine (NORVASC) 5 MG tablet   Other Relevant Orders   Microalbumin, Urine Waived   Bayer DCA Hb A1c Waived   Basic metabolic panel   LP+ALT+AST Piccolo, Waived     Endocrine   Type 2 DM mild nonproliferative retinopathy, no macular edema, control (HCC)    Discussed diabetes poor control patient will do better with lifestyle diet nutrition exercise also discussed insulin dosing.      Relevant Medications   atorvastatin (LIPITOR) 40 MG tablet   Insulin Glargine (LANTUS SOLOSTAR) 100 UNIT/ML Solostar Pen   insulin lispro (HUMALOG KWIKPEN) 100 UNIT/ML KiwkPen   Other Relevant Orders   Microalbumin, Urine Waived   Bayer DCA Hb A1c Waived   Basic metabolic panel   LP+ALT+AST Piccolo, Waived     Genitourinary   BPH (benign prostatic  hyperplasia)     Other   Hyperlipidemia    Discussed cholesterol poor control patient was hoping with diet exercise nutrition cholesterol would be better but too awful will restart atorvastatin 40 mg 1 a day.      Relevant Medications   atorvastatin (LIPITOR) 40 MG tablet   amLODipine (NORVASC) 5 MG tablet   Other Relevant Orders   Microalbumin, Urine Waived   Bayer DCA Hb A1c Waived   Basic metabolic panel   LP+ALT+AST Piccolo, Waived    Other Visit Diagnoses    Need for hepatitis C screening test       Relevant Orders   Hepatitis C Antibody    Colon cancer screening       Relevant Orders   Ambulatory referral to Gastroenterology   Diabetes mellitus without complication ([place])       Relevant Medications   atorvastatin (LIPITOR) 40 MG tablet   Insulin Glargine (LANTUS SOLOSTAR) 100 UNIT/ML Solostar Pen   insulin lispro (HUMALOG KWIKPEN) 100 UNIT/ML KiwkPen       Follow up plan: Return in about 3 months (around 05/25/2018) for Hemoglobin A1c, Physical Exam.

## 2018-02-22 NOTE — Assessment & Plan Note (Signed)
Discussed diabetes poor control patient will do better with lifestyle diet nutrition exercise also discussed insulin dosing.

## 2018-02-22 NOTE — Assessment & Plan Note (Signed)
Blood pressure elevated here in the office patient's going to be doing better with lifestyle diet exercise nutrition will follow blood pressure and if not coming down will notify us to get back on medications.

## 2018-02-22 NOTE — Assessment & Plan Note (Signed)
Discussed cholesterol poor control patient was hoping with diet exercise nutrition cholesterol would be better but too awful will restart atorvastatin 40 mg 1 a day.

## 2018-02-23 ENCOUNTER — Encounter: Payer: Self-pay | Admitting: Family Medicine

## 2018-02-23 LAB — BASIC METABOLIC PANEL
BUN / CREAT RATIO: 13 (ref 10–24)
BUN: 17 mg/dL (ref 8–27)
CHLORIDE: 107 mmol/L — AB (ref 96–106)
CO2: 20 mmol/L (ref 20–29)
Calcium: 9.4 mg/dL (ref 8.6–10.2)
Creatinine, Ser: 1.27 mg/dL (ref 0.76–1.27)
GFR calc non Af Amer: 57 mL/min/{1.73_m2} — ABNORMAL LOW (ref >59)
GFR, EST AFRICAN AMERICAN: 66 mL/min/{1.73_m2} (ref >59)
GLUCOSE: 218 mg/dL — AB (ref 65–99)
Potassium: 4.1 mmol/L (ref 3.5–5.2)
SODIUM: 142 mmol/L (ref 134–144)

## 2018-02-23 LAB — HEPATITIS C ANTIBODY

## 2018-03-01 ENCOUNTER — Other Ambulatory Visit: Payer: Self-pay

## 2018-03-01 DIAGNOSIS — Z1211 Encounter for screening for malignant neoplasm of colon: Secondary | ICD-10-CM

## 2018-03-04 ENCOUNTER — Other Ambulatory Visit: Payer: Self-pay

## 2018-04-16 ENCOUNTER — Encounter: Payer: Self-pay | Admitting: *Deleted

## 2018-04-19 ENCOUNTER — Encounter: Admission: RE | Payer: Self-pay | Source: Ambulatory Visit

## 2018-04-19 ENCOUNTER — Ambulatory Visit: Admission: RE | Admit: 2018-04-19 | Payer: Medicare Other | Source: Ambulatory Visit | Admitting: Gastroenterology

## 2018-04-19 SURGERY — COLONOSCOPY WITH PROPOFOL
Anesthesia: General

## 2018-04-26 DIAGNOSIS — H43391 Other vitreous opacities, right eye: Secondary | ICD-10-CM | POA: Diagnosis not present

## 2018-04-26 DIAGNOSIS — E113293 Type 2 diabetes mellitus with mild nonproliferative diabetic retinopathy without macular edema, bilateral: Secondary | ICD-10-CM | POA: Diagnosis not present

## 2018-04-26 DIAGNOSIS — H26491 Other secondary cataract, right eye: Secondary | ICD-10-CM | POA: Diagnosis not present

## 2018-04-27 DIAGNOSIS — H26491 Other secondary cataract, right eye: Secondary | ICD-10-CM | POA: Diagnosis not present

## 2018-04-29 DIAGNOSIS — H26491 Other secondary cataract, right eye: Secondary | ICD-10-CM | POA: Diagnosis not present

## 2018-05-03 ENCOUNTER — Telehealth: Payer: Self-pay | Admitting: Family Medicine

## 2018-05-03 DIAGNOSIS — E119 Type 2 diabetes mellitus without complications: Secondary | ICD-10-CM

## 2018-05-03 NOTE — Telephone Encounter (Signed)
Copied from [place] [id number]. Topic: Quick Communication - Rx Refill/Question >> May 03, 2018 10:10 AM Reyne Dumas L wrote: Medication:  insulin lispro (HUMALOG KWIKPEN) 100 UNIT/ML KiwkPen insulin lispro (HUMALOG KWIKPEN) 100 UNIT/ML KiwkPen  Has the patient contacted their pharmacy? No - he is out.  He states he doesn't get paid until next Friday and needs to know if the office can give him samples (Agent: If no, request that the patient contact the pharmacy for the refill.) (Agent: If yes, when and what did the pharmacy advise?)  Preferred Pharmacy (with phone number or street name): [id number] [address], [place] - [address] [phone] (Phone) [phone] (Fax)  Agent: Please be advised that RX refills may take up to 3 business days. We ask that you follow-up with your pharmacy.

## 2018-05-04 ENCOUNTER — Telehealth: Payer: Self-pay | Admitting: Family Medicine

## 2018-05-04 ENCOUNTER — Other Ambulatory Visit: Payer: Self-pay | Admitting: Family Medicine

## 2018-05-04 DIAGNOSIS — E119 Type 2 diabetes mellitus without complications: Secondary | ICD-10-CM

## 2018-05-04 MED ORDER — INSULIN LISPRO 100 UNIT/ML (KWIKPEN): PEN_INJECTOR | SUBCUTANEOUS | 10 refills | Status: DC

## 2018-05-04 NOTE — Telephone Encounter (Signed)
One sample signed out for patient, patient notified.

## 2018-05-04 NOTE — Telephone Encounter (Signed)
Pt called in to inquire about getting a sample of humalog [employer]

## 2018-05-26 NOTE — Telephone Encounter (Signed)
Opened in error

## 2018-06-15 ENCOUNTER — Ambulatory Visit: Payer: Medicare Other | Admitting: Family Medicine

## 2018-07-09 ENCOUNTER — Telehealth: Payer: Self-pay | Admitting: Family Medicine

## 2018-07-09 NOTE — Telephone Encounter (Signed)
Pt contacted pharmacy and they stated that there were no refills.

## 2018-07-09 NOTE — Telephone Encounter (Signed)
Called and spoke with pharmacy, they only received the Lantus prescription, so I called the Humalog prescription into the pharmacy that was written by Dr.Crissman in August.

## 2018-07-09 NOTE — Telephone Encounter (Signed)
Pt would like a refill for lantus and humalog to [facility]. He has an appt scheduled for 07/19/18.

## 2018-07-09 NOTE — Telephone Encounter (Signed)
Have patient contact his pharmacy

## 2018-07-09 NOTE — Telephone Encounter (Signed)
Call pt Very confusing look in his chart and you will see I gave plenty of refills in August.  You can send those again or the drugstore can request those again. I am about to log off the computer and will not look at it again until Monday.

## 2018-07-19 ENCOUNTER — Encounter: Payer: Self-pay | Admitting: Family Medicine

## 2018-07-19 ENCOUNTER — Ambulatory Visit (INDEPENDENT_AMBULATORY_CARE_PROVIDER_SITE_OTHER): Payer: Medicare Other | Admitting: Family Medicine

## 2018-07-19 VITALS — BP 163/88 | HR 66 | Temp 98.7°F | Ht 70.0 in | Wt 233.0 lb

## 2018-07-19 DIAGNOSIS — E113293 Type 2 diabetes mellitus with mild nonproliferative diabetic retinopathy without macular edema, bilateral: Secondary | ICD-10-CM | POA: Diagnosis not present

## 2018-07-19 DIAGNOSIS — N400 Enlarged prostate without lower urinary tract symptoms: Secondary | ICD-10-CM | POA: Diagnosis not present

## 2018-07-19 DIAGNOSIS — Z1329 Encounter for screening for other suspected endocrine disorder: Secondary | ICD-10-CM | POA: Diagnosis not present

## 2018-07-19 DIAGNOSIS — E785 Hyperlipidemia, unspecified: Secondary | ICD-10-CM

## 2018-07-19 DIAGNOSIS — Z794 Long term (current) use of insulin: Secondary | ICD-10-CM | POA: Diagnosis not present

## 2018-07-19 DIAGNOSIS — I10 Essential (primary) hypertension: Secondary | ICD-10-CM | POA: Diagnosis not present

## 2018-07-19 DIAGNOSIS — Z125 Encounter for screening for malignant neoplasm of prostate: Secondary | ICD-10-CM

## 2018-07-19 LAB — URINALYSIS, ROUTINE W REFLEX MICROSCOPIC
Bilirubin, UA: NEGATIVE
Ketones, UA: NEGATIVE
Leukocytes, UA: NEGATIVE
NITRITE UA: NEGATIVE
RBC UA: NEGATIVE
Specific Gravity, UA: 1.025 (ref 1.005–1.030)
UUROB: 0.2 mg/dL (ref 0.2–1.0)
pH, UA: 5 (ref 5.0–7.5)

## 2018-07-19 LAB — BAYER DCA HB A1C WAIVED: HB A1C (BAYER DCA - WAIVED): 7.5 % — ABNORMAL HIGH (ref <7.0)

## 2018-07-19 MED ORDER — SILDENAFIL CITRATE 50 MG PO TABS: 50.0000 mg | ORAL_TABLET | Freq: Every day | ORAL | 12 refills | Status: DC | PRN

## 2018-07-19 NOTE — Assessment & Plan Note (Signed)
Is eye seems to be doing better and better reports from his eye doctor with taking insulin on a faithful basis Gave samples

## 2018-07-19 NOTE — Assessment & Plan Note (Signed)
The current medical regimen is effective;  continue present plan and medications. a 

## 2018-07-19 NOTE — Progress Notes (Signed)
BP (!) 163/88   Pulse 66   Temp 98.7 F (37.1 C) (Oral)   Ht 5\' 10"  (1.778 m)   Wt 233 lb (105.7 kg)   SpO2 95%   BMI 33.43 kg/m    Subjective:    Patient ID: Jack Estrada, male    DOB: 1946-06-12, 72 y.o.   MRN: [id number]  HPI: Jack Estrada is a 72 y.o. male  Patient with blood pressure recheck taking 5 mg every other day because some days his blood pressures like it was last time he checked it at home 105/76.  Then feels too lightheaded dizzy when he takes his amlodipine 5 mg every day.  Has not tried taking it every day at half a pill. Patient's diabetes doing better with use of his insulin on a more regular basis still limited by the cost and is still in the donut hole.   Relevant past medical, surgical, family and social history reviewed and updated as indicated. Interim medical history since our last visit reviewed. Allergies and medications reviewed and updated.  Review of Systems  Constitutional: Negative.   Respiratory: Negative.   Cardiovascular: Negative.     Per HPI unless specifically indicated above     Objective:    BP (!) 163/88   Pulse 66   Temp 98.7 F (37.1 C) (Oral)   Ht 5\' 10"  (1.778 m)   Wt 233 lb (105.7 kg)   SpO2 95%   BMI 33.43 kg/m   Wt Readings from Last 3 Encounters:  07/19/18 233 lb (105.7 kg)  04/28/17 226 lb (102.5 kg)  03/06/17 218 lb 12.8 oz (99.2 kg)    Physical Exam  Constitutional: He is oriented to person, place, and time. He appears well-developed and well-nourished.  HENT:  Head: Normocephalic and atraumatic.  Eyes: Conjunctivae and EOM are normal.  Neck: Normal range of motion.  Cardiovascular: Normal rate, regular rhythm and normal heart sounds.  Pulmonary/Chest: Effort normal and breath sounds normal.  Musculoskeletal: Normal range of motion.  Neurological: He is alert and oriented to person, place, and time.  Skin: No erythema.  Psychiatric: He has a normal mood and affect. His behavior is normal. Judgment and  thought content normal.    Results for orders placed or performed in visit on 07/19/18  Bayer DCA Hb A1c Waived  Result Value Ref Range   HB A1C (BAYER DCA - WAIVED) 7.5 (H) <7.0 %  Urinalysis, Routine w reflex microscopic  Result Value Ref Range   Specific Gravity, UA 1.025 1.005 - 1.030   pH, UA 5.0 5.0 - 7.5   Color, UA Yellow Yellow   Appearance Ur Clear Clear   Leukocytes, UA Negative Negative   Protein, UA Trace (A) Negative/Trace   Glucose, UA Trace (A) Negative   Ketones, UA Negative Negative   RBC, UA Negative Negative   Bilirubin, UA Negative Negative   Urobilinogen, Ur 0.2 0.2 - 1.0 mg/dL   Nitrite, UA Negative Negative      Assessment & Plan:   Problem List Items Addressed This Visit      Cardiovascular and Mediastinum   Essential hypertension - Primary    Poor control hypertension discussed better control weight loss diet and medication will take medication faithfully for now and observe blood pressure      Relevant Medications   sildenafil (VIAGRA) 50 MG tablet   Other Relevant Orders   Bayer DCA Hb A1c Waived (Completed)   Lipid panel  Endocrine   Type 2 DM mild nonproliferative retinopathy, no macular edema, control (HCC)    Is eye seems to be doing better and better reports from his eye doctor with taking insulin on a faithful basis Gave samples      Relevant Orders   Bayer DCA Hb A1c Waived (Completed)   Lipid panel     Genitourinary   BPH (benign prostatic hyperplasia)   Relevant Orders   Lipid panel     Other   Hyperlipidemia    The current medical regimen is effective;  continue present plan and medications. a      Relevant Medications   sildenafil (VIAGRA) 50 MG tablet   Other Relevant Orders   Bayer DCA Hb A1c Waived (Completed)   Lipid panel    Other Visit Diagnoses    Thyroid disorder screen       Prostate cancer screening           Follow up plan: Return in about 3 months (around 10/19/2018) for Physical Exam,  Hemoglobin A1c.

## 2018-07-19 NOTE — Assessment & Plan Note (Signed)
Poor control hypertension discussed better control weight loss diet and medication will take medication faithfully for now and observe blood pressure

## 2018-07-20 ENCOUNTER — Telehealth: Payer: Self-pay | Admitting: Family Medicine

## 2018-07-20 LAB — COMPREHENSIVE METABOLIC PANEL
ALT: 26 IU/L (ref 0–44)
AST: 26 IU/L (ref 0–40)
Albumin/Globulin Ratio: 1.3 (ref 1.2–2.2)
Albumin: 4 g/dL (ref 3.5–4.8)
Alkaline Phosphatase: 104 IU/L (ref 39–117)
BUN/Creatinine Ratio: 18 (ref 10–24)
BUN: 23 mg/dL (ref 8–27)
Bilirubin Total: <0.2 mg/dL (ref 0.0–1.2)
CALCIUM: 9.3 mg/dL (ref 8.6–10.2)
CO2: 21 mmol/L (ref 20–29)
CREATININE: 1.27 mg/dL (ref 0.76–1.27)
Chloride: 100 mmol/L (ref 96–106)
GFR calc Af Amer: 66 mL/min/{1.73_m2} (ref >59)
GFR, EST NON AFRICAN AMERICAN: 57 mL/min/{1.73_m2} — AB (ref >59)
GLUCOSE: 157 mg/dL — AB (ref 65–99)
Globulin, Total: 3.1 g/dL (ref 1.5–4.5)
Potassium: 4.5 mmol/L (ref 3.5–5.2)
Sodium: 138 mmol/L (ref 134–144)
Total Protein: 7.1 g/dL (ref 6.0–8.5)

## 2018-07-20 LAB — CBC WITH DIFFERENTIAL/PLATELET
Basophils Absolute: 0.1 10*3/uL (ref 0.0–0.2)
Basos: 1 %
EOS (ABSOLUTE): 0.3 10*3/uL (ref 0.0–0.4)
Eos: 5 %
HEMATOCRIT: 42 % (ref 37.5–51.0)
Hemoglobin: 14.6 g/dL (ref 13.0–17.7)
Immature Grans (Abs): 0 10*3/uL (ref 0.0–0.1)
Immature Granulocytes: 0 %
LYMPHS ABS: 1.8 10*3/uL (ref 0.7–3.1)
Lymphs: 26 %
MCH: 31.1 pg (ref 26.6–33.0)
MCHC: 34.8 g/dL (ref 31.5–35.7)
MCV: 89 fL (ref 79–97)
MONOS ABS: 0.6 10*3/uL (ref 0.1–0.9)
Monocytes: 9 %
NEUTROS PCT: 59 %
Neutrophils Absolute: 4.2 10*3/uL (ref 1.4–7.0)
Platelets: 176 10*3/uL (ref 150–450)
RBC: 4.7 x10E6/uL (ref 4.14–5.80)
RDW: 13 % (ref 12.3–15.4)
WBC: 7 10*3/uL (ref 3.4–10.8)

## 2018-07-20 LAB — LIPID PANEL
CHOL/HDL RATIO: 8.7 ratio — AB (ref 0.0–5.0)
Cholesterol, Total: 253 mg/dL — ABNORMAL HIGH (ref 100–199)
HDL: 29 mg/dL — ABNORMAL LOW (ref >39)
Triglycerides: 641 mg/dL (ref 0–149)

## 2018-07-20 LAB — PSA: PROSTATE SPECIFIC AG, SERUM: 0.6 ng/mL (ref 0.0–4.0)

## 2018-07-20 LAB — TSH: TSH: 2.27 u[IU]/mL (ref 0.450–4.500)

## 2018-07-20 NOTE — Telephone Encounter (Signed)
Phone call Discussed with patient markedly elevated cholesterol patient will do better with diet exercise nutrition and get back on his medications.

## 2018-07-20 NOTE — Telephone Encounter (Signed)
-----   Message from Georgina Peer, [place] sent at 07/20/2018 11:24 AM EST ----- Phone call.

## 2018-08-30 ENCOUNTER — Telehealth: Payer: Self-pay | Admitting: Family Medicine

## 2018-08-30 NOTE — Telephone Encounter (Signed)
[name] presented in office wanting to know what the name of the insulin you had gave him to use temporally? Please advise

## 2018-10-18 ENCOUNTER — Encounter: Payer: Medicare Other | Admitting: Family Medicine

## 2018-10-18 NOTE — Telephone Encounter (Signed)
closing encounter

## 2018-10-28 ENCOUNTER — Other Ambulatory Visit: Payer: Self-pay

## 2018-10-28 ENCOUNTER — Encounter

## 2018-10-28 ENCOUNTER — Ambulatory Visit (INDEPENDENT_AMBULATORY_CARE_PROVIDER_SITE_OTHER): Payer: Medicare Other | Admitting: Family Medicine

## 2018-10-28 ENCOUNTER — Encounter: Payer: Self-pay | Admitting: Family Medicine

## 2018-10-28 VITALS — BP 123/81 | HR 66 | Temp 98.0°F | Ht 70.0 in | Wt 220.0 lb

## 2018-10-28 DIAGNOSIS — J069 Acute upper respiratory infection, unspecified: Secondary | ICD-10-CM | POA: Diagnosis not present

## 2018-10-28 NOTE — Progress Notes (Signed)
BP 123/81 (BP Location: Left Arm, Patient Position: Sitting, Cuff Size: Normal)   Pulse 66   Temp 98 F (36.7 C) (Oral)   Ht 5\' 10"  (1.778 m)   Wt 220 lb (99.8 kg)   SpO2 95%   BMI 31.57 kg/m    Subjective:    Patient ID: Jack Estrada, male    DOB: 02/01/1946, 73 y.o.   MRN: [id number]  HPI: WAQAS BRUHL is a 73 y.o. male  Chief Complaint  Patient presents with  . Ear Problem    Patient states ears are popping  . Chest Congestion   About a week of congestion, ear pressure, post nasal drainage, hoarseness. Feeling some better today. Taking OTC dayquil and nyquil with some relief. Denies fevers, chills, body aches, CP, SOB. Several sick contacts. No hx of pulmonary dz. Non smoker.   Relevant past medical, surgical, family and social history reviewed and updated as indicated. Interim medical history since our last visit reviewed. Allergies and medications reviewed and updated.  Review of Systems  Per HPI unless specifically indicated above     Objective:    BP 123/81 (BP Location: Left Arm, Patient Position: Sitting, Cuff Size: Normal)   Pulse 66   Temp 98 F (36.7 C) (Oral)   Ht 5\' 10"  (1.778 m)   Wt 220 lb (99.8 kg)   SpO2 95%   BMI 31.57 kg/m   Wt Readings from Last 3 Encounters:  10/28/18 220 lb (99.8 kg)  07/19/18 233 lb (105.7 kg)  04/28/17 226 lb (102.5 kg)    Physical Exam Vitals signs and nursing note reviewed.  Constitutional:      Appearance: He is well-developed.  HENT:     Head: Atraumatic.     Right Ear: External ear normal.     Left Ear: External ear normal.     Ears:     Comments: B/l middle ear effusion    Nose: Rhinorrhea present.     Mouth/Throat:     Pharynx: Posterior oropharyngeal erythema present. No oropharyngeal exudate.  Eyes:     Conjunctiva/sclera: Conjunctivae normal.     Pupils: Pupils are equal, round, and reactive to light.  Neck:     Musculoskeletal: Normal range of motion and neck supple.  Cardiovascular:     Rate  and Rhythm: Normal rate and regular rhythm.  Pulmonary:     Effort: Pulmonary effort is normal. No respiratory distress.     Breath sounds: No wheezing or rales.  Musculoskeletal: Normal range of motion.  Lymphadenopathy:     Cervical: No cervical adenopathy.  Skin:    General: Skin is warm and dry.  Neurological:     Mental Status: He is alert and oriented to person, place, and time.  Psychiatric:        Behavior: Behavior normal.     Results for orders placed or performed in visit on 07/19/18  Bayer DCA Hb A1c Waived  Result Value Ref Range   HB A1C (BAYER DCA - WAIVED) 7.5 (H) <7.0 %  Lipid panel  Result Value Ref Range   Cholesterol, Total 253 (H) 100 - 199 mg/dL   Triglycerides 641 (HH) 0 - 149 mg/dL   HDL 29 (L) >39 mg/dL   VLDL Cholesterol Cal Comment 5 - 40 mg/dL   LDL Calculated Comment 0 - 99 mg/dL   Chol/HDL Ratio 8.7 (H) 0.0 - 5.0 ratio  Urinalysis, Routine w reflex microscopic  Result Value Ref Range   Specific Gravity,  UA 1.025 1.005 - 1.030   pH, UA 5.0 5.0 - 7.5   Color, UA Yellow Yellow   Appearance Ur Clear Clear   Leukocytes, UA Negative Negative   Protein, UA Trace (A) Negative/Trace   Glucose, UA Trace (A) Negative   Ketones, UA Negative Negative   RBC, UA Negative Negative   Bilirubin, UA Negative Negative   Urobilinogen, Ur 0.2 0.2 - 1.0 mg/dL   Nitrite, UA Negative Negative  CBC with Differential/Platelet  Result Value Ref Range   WBC 7.0 3.4 - 10.8 x10E3/uL   RBC 4.70 4.14 - 5.80 x10E6/uL   Hemoglobin 14.6 13.0 - 17.7 g/dL   Hematocrit 42.0 37.5 - 51.0 %   MCV 89 79 - 97 fL   MCH 31.1 26.6 - 33.0 pg   MCHC 34.8 31.5 - 35.7 g/dL   RDW 13.0 12.3 - 15.4 %   Platelets 176 150 - 450 x10E3/uL   Neutrophils 59 Not Estab. %   Lymphs 26 Not Estab. %   Monocytes 9 Not Estab. %   Eos 5 Not Estab. %   Basos 1 Not Estab. %   Neutrophils Absolute 4.2 1.4 - 7.0 x10E3/uL   Lymphocytes Absolute 1.8 0.7 - 3.1 x10E3/uL   Monocytes Absolute 0.6 0.1 -  0.9 x10E3/uL   EOS (ABSOLUTE) 0.3 0.0 - 0.4 x10E3/uL   Basophils Absolute 0.1 0.0 - 0.2 x10E3/uL   Immature Granulocytes 0 Not Estab. %   Immature Grans (Abs) 0.0 0.0 - 0.1 x10E3/uL  Comprehensive metabolic panel  Result Value Ref Range   Glucose 157 (H) 65 - 99 mg/dL   BUN 23 8 - 27 mg/dL   Creatinine, Ser 1.27 0.76 - 1.27 mg/dL   GFR calc non Af Amer 57 (L) >59 mL/min/1.73   GFR calc Af Amer 66 >59 mL/min/1.73   BUN/Creatinine Ratio 18 10 - 24   Sodium 138 134 - 144 mmol/L   Potassium 4.5 3.5 - 5.2 mmol/L   Chloride 100 96 - 106 mmol/L   CO2 21 20 - 29 mmol/L   Calcium 9.3 8.6 - 10.2 mg/dL   Total Protein 7.1 6.0 - 8.5 g/dL   Albumin 4.0 3.5 - 4.8 g/dL   Globulin, Total 3.1 1.5 - 4.5 g/dL   Albumin/Globulin Ratio 1.3 1.2 - 2.2   Bilirubin Total <0.2 0.0 - 1.2 mg/dL   Alkaline Phosphatase 104 39 - 117 IU/L   AST 26 0 - 40 IU/L   ALT 26 0 - 44 IU/L  TSH  Result Value Ref Range   TSH 2.270 0.450 - 4.500 uIU/mL  PSA  Result Value Ref Range   Prostate Specific Ag, Serum 0.6 0.0 - 4.0 ng/mL      Assessment & Plan:   Problem List Items Addressed This Visit    None    Visit Diagnoses    Viral URI    -  Primary   No evidence of bacterial infection, and sxs improving per pt. Continue good OTC remedies and supportive care. Call if worsening or not improving       Follow up plan: Return if symptoms worsen or fail to improve.

## 2018-11-02 ENCOUNTER — Telehealth: Payer: Self-pay | Admitting: Family Medicine

## 2018-11-02 NOTE — Telephone Encounter (Signed)
Copied from [place] [id number]. Topic: Quick Communication - See Telephone Encounter >> Nov 02, 2018  2:39 PM Vernona Rieger wrote: CRM for notification. See Telephone encounter for: 11/02/18. Pt states he seen Apolonio Schneiders on 2/13 and she gave him a sample inhaler. He said he has misplaced the inhaler & is wondering if she would give him another.

## 2018-11-02 NOTE — Telephone Encounter (Signed)
Reviewed note. Unsure which inhaler pt received. Will forward to provider.

## 2018-11-03 NOTE — Telephone Encounter (Signed)
Ok to give him a symbicort or breo sample

## 2018-11-18 DIAGNOSIS — L570 Actinic keratosis: Secondary | ICD-10-CM | POA: Diagnosis not present

## 2018-12-28 ENCOUNTER — Encounter: Payer: Self-pay | Admitting: Family Medicine

## 2019-01-18 ENCOUNTER — Ambulatory Visit: Payer: Self-pay | Admitting: Family Medicine

## 2019-01-18 NOTE — Telephone Encounter (Signed)
Pt called in c/o pain and swelling in his left ring finger that came up on its own.   He denies injury.  I warm transferred his call into Dr. Rance Muir office to be scheduled for a virtual visit  (COVID-19 pandemic).  I sent my notes to the office.   Reason for Disposition . [1] MODERATE-SEVERE pain AND [2] blood present under a nail  Answer Assessment - Initial Assessment Questions 1. MECHANISM: "How did the injury happen?"      Left hand ring finger is swollen. 2. ONSET: "When did the injury happen?" (Minutes or hours ago)      I'm not sure.   I think it's infected.   It's been that way about a week. 3. LOCATION: "What part of the finger is injured?" "Is the nail damaged?"      Left corner of the fingernail. 4. APPEARANCE of the INJURY: "What does the injury look like?"      Swollen, red and a little pus coming from it. 5. SEVERITY: "Can you use the hand normally?"  "Can you bend your fingers into a ball and then fully open them?"     My finger is very sore to touch. 6. SIZE: For cuts, bruises, or swelling, ask: "How large is it?" (e.g., inches or centimeters;  entire finger)      Fingernail. 7. PAIN: "Is there pain?" If so, ask: "How bad is the pain?"    (e.g., Scale 1-10; or mild, moderate, severe)     Everything I touch makes it hurt. 8. TETANUS: For any breaks in the skin, ask: "When was the last tetanus booster?"     Not long ago.    9. OTHER SYMPTOMS: "Do you have any other symptoms?"     I'm diabetic. 10. PREGNANCY: "Is there any chance you are pregnant?" "When was your last menstrual period?"       N/A  Protocols used: FINGER INJURY-A-AH

## 2019-01-19 ENCOUNTER — Other Ambulatory Visit: Payer: Self-pay

## 2019-01-19 ENCOUNTER — Encounter: Payer: Self-pay | Admitting: Nurse Practitioner

## 2019-01-19 ENCOUNTER — Ambulatory Visit (INDEPENDENT_AMBULATORY_CARE_PROVIDER_SITE_OTHER): Payer: Medicare Other | Admitting: Nurse Practitioner

## 2019-01-19 DIAGNOSIS — L03012 Cellulitis of left finger: Secondary | ICD-10-CM | POA: Diagnosis not present

## 2019-01-19 MED ORDER — CEPHALEXIN 500 MG PO CAPS: 500.0000 mg | ORAL_CAPSULE | Freq: Three times a day (TID) | ORAL | 0 refills | Status: AC

## 2019-01-19 MED ORDER — MUPIROCIN 2 % EX OINT: 1.0000 "application " | TOPICAL_OINTMENT | Freq: Two times a day (BID) | CUTANEOUS | 0 refills | Status: DC

## 2019-01-19 NOTE — Progress Notes (Signed)
BP 123/75   Pulse 65   Temp 98.1 F (36.7 C) (Oral)   Ht 6' (1.829 m)   Wt 221 lb (100.2 kg)   SpO2 97%   BMI 29.97 kg/m    Subjective:    Patient ID: Jack Estrada, male    DOB: April 15, 1946, 73 y.o.   MRN: [id number]  HPI: Jack Estrada is a 73 y.o. male  Chief Complaint  Patient presents with  . Swelling finger    left ring finger. x a week. states started hurting and getting worse about 2-3 days ago. have tried OTC meds. not helping   SWELLING LEFT 4th FINGER: Started one week ago, but then 3-4 days ago it began swelling and had drainage.  At home was using peroxide and he endorses he "picked at it some".  Has used ASA for discomfort.  He endorses tenderness to finger and reports he has had some clear drainage to finger today.  Did have whitish "pus-like" drainage before.  Denies difficulty moving finger.  Relevant past medical, surgical, family and social history reviewed and updated as indicated. Interim medical history since our last visit reviewed. Allergies and medications reviewed and updated.  Review of Systems  Constitutional: Negative for activity change, diaphoresis, fatigue and fever.  Respiratory: Negative for cough, chest tightness, shortness of breath and wheezing.   Cardiovascular: Negative for chest pain, palpitations and leg swelling.  Gastrointestinal: Negative for abdominal distention, abdominal pain, constipation, diarrhea, nausea and vomiting.  Musculoskeletal: Negative.   Skin: Positive for wound.  Psychiatric/Behavioral: Negative.     Per HPI unless specifically indicated above     Objective:    BP 123/75   Pulse 65   Temp 98.1 F (36.7 C) (Oral)   Ht 6' (1.829 m)   Wt 221 lb (100.2 kg)   SpO2 97%   BMI 29.97 kg/m   Wt Readings from Last 3 Encounters:  01/19/19 221 lb (100.2 kg)  10/28/18 220 lb (99.8 kg)  07/19/18 233 lb (105.7 kg)    Physical Exam Vitals signs and nursing note reviewed.  Constitutional:      Appearance: He is  well-developed.  HENT:     Head: Normocephalic and atraumatic.     Right Ear: Hearing normal. No drainage.     Left Ear: Hearing normal. No drainage.     Mouth/Throat:     Pharynx: Uvula midline.  Eyes:     General: Lids are normal.        Right eye: No discharge.        Left eye: No discharge.     Conjunctiva/sclera: Conjunctivae normal.     Pupils: Pupils are equal, round, and reactive to light.  Neck:     Musculoskeletal: Normal range of motion and neck supple.  Cardiovascular:     Rate and Rhythm: Normal rate and regular rhythm.     Heart sounds: Normal heart sounds, S1 normal and S2 normal. No murmur. No gallop.   Pulmonary:     Effort: Pulmonary effort is normal.     Breath sounds: Normal breath sounds.  Abdominal:     General: Bowel sounds are normal.     Palpations: Abdomen is soft. There is no hepatomegaly or splenomegaly.  Musculoskeletal: Normal range of motion.     Right lower leg: No edema.     Left lower leg: No edema.  Skin:    General: Skin is warm and dry.     Capillary Refill: Capillary refill takes  less than 2 seconds.     Findings: No rash.     Comments: Left fourth finger with moderate erythema and edema at nailbed.  Yellowish crusting present, no drainage at this time.  Area slightly warm and tender to palpation.  Full ROM of finger present.  Neurological:     Mental Status: He is alert and oriented to person, place, and time.     Deep Tendon Reflexes: Reflexes are normal and symmetric.  Psychiatric:        Mood and Affect: Mood normal.        Behavior: Behavior normal.        Thought Content: Thought content normal.        Judgment: Judgment normal.     Results for orders placed or performed in visit on 07/19/18  Bayer DCA Hb A1c Waived  Result Value Ref Range   HB A1C (BAYER DCA - WAIVED) 7.5 (H) <7.0 %  Lipid panel  Result Value Ref Range   Cholesterol, Total 253 (H) 100 - 199 mg/dL   Triglycerides 641 (HH) 0 - 149 mg/dL   HDL 29 (L) >39  mg/dL   VLDL Cholesterol Cal Comment 5 - 40 mg/dL   LDL Calculated Comment 0 - 99 mg/dL   Chol/HDL Ratio 8.7 (H) 0.0 - 5.0 ratio  Urinalysis, Routine w reflex microscopic  Result Value Ref Range   Specific Gravity, UA 1.025 1.005 - 1.030   pH, UA 5.0 5.0 - 7.5   Color, UA Yellow Yellow   Appearance Ur Clear Clear   Leukocytes, UA Negative Negative   Protein, UA Trace (A) Negative/Trace   Glucose, UA Trace (A) Negative   Ketones, UA Negative Negative   RBC, UA Negative Negative   Bilirubin, UA Negative Negative   Urobilinogen, Ur 0.2 0.2 - 1.0 mg/dL   Nitrite, UA Negative Negative  CBC with Differential/Platelet  Result Value Ref Range   WBC 7.0 3.4 - 10.8 x10E3/uL   RBC 4.70 4.14 - 5.80 x10E6/uL   Hemoglobin 14.6 13.0 - 17.7 g/dL   Hematocrit 42.0 37.5 - 51.0 %   MCV 89 79 - 97 fL   MCH 31.1 26.6 - 33.0 pg   MCHC 34.8 31.5 - 35.7 g/dL   RDW 13.0 12.3 - 15.4 %   Platelets 176 150 - 450 x10E3/uL   Neutrophils 59 Not Estab. %   Lymphs 26 Not Estab. %   Monocytes 9 Not Estab. %   Eos 5 Not Estab. %   Basos 1 Not Estab. %   Neutrophils Absolute 4.2 1.4 - 7.0 x10E3/uL   Lymphocytes Absolute 1.8 0.7 - 3.1 x10E3/uL   Monocytes Absolute 0.6 0.1 - 0.9 x10E3/uL   EOS (ABSOLUTE) 0.3 0.0 - 0.4 x10E3/uL   Basophils Absolute 0.1 0.0 - 0.2 x10E3/uL   Immature Granulocytes 0 Not Estab. %   Immature Grans (Abs) 0.0 0.0 - 0.1 x10E3/uL  Comprehensive metabolic panel  Result Value Ref Range   Glucose 157 (H) 65 - 99 mg/dL   BUN 23 8 - 27 mg/dL   Creatinine, Ser 1.27 0.76 - 1.27 mg/dL   GFR calc non Af Amer 57 (L) >59 mL/min/1.73   GFR calc Af Amer 66 >59 mL/min/1.73   BUN/Creatinine Ratio 18 10 - 24   Sodium 138 134 - 144 mmol/L   Potassium 4.5 3.5 - 5.2 mmol/L   Chloride 100 96 - 106 mmol/L   CO2 21 20 - 29 mmol/L   Calcium 9.3 8.6 - 10.2  mg/dL   Total Protein 7.1 6.0 - 8.5 g/dL   Albumin 4.0 3.5 - 4.8 g/dL   Globulin, Total 3.1 1.5 - 4.5 g/dL   Albumin/Globulin Ratio 1.3 1.2 -  2.2   Bilirubin Total <0.2 0.0 - 1.2 mg/dL   Alkaline Phosphatase 104 39 - 117 IU/L   AST 26 0 - 40 IU/L   ALT 26 0 - 44 IU/L  TSH  Result Value Ref Range   TSH 2.270 0.450 - 4.500 uIU/mL  PSA  Result Value Ref Range   Prostate Specific Ag, Serum 0.6 0.0 - 4.0 ng/mL      Assessment & Plan:   Problem List Items Addressed This Visit      Musculoskeletal and Integument   Paronychia of left ring finger    Acute.  Will initiate Cephalexin 500 MG TID and Mupirocin ointment, scripts sent.  Recommend warm water soaks 3-4 times a day to assist with discomfort and edema.  Monitor for decrease function of finger or worsening symptoms.  May take Tylenol as needed for pain.  Return in 2 days for reassessment of area, consider I&D if worsening.          Follow up plan: Return in about 2 days (around 01/21/2019) for Finger reassessment.

## 2019-01-19 NOTE — Assessment & Plan Note (Signed)
Acute.  Will initiate Cephalexin 500 MG TID and Mupirocin ointment, scripts sent.  Recommend warm water soaks 3-4 times a day to assist with discomfort and edema.  Monitor for decrease function of finger or worsening symptoms.  May take Tylenol as needed for pain.  Return in 2 days for reassessment of area, consider I&D if worsening.

## 2019-01-19 NOTE — Patient Instructions (Signed)
Paronychia  Paronychia is an infection of the skin. It happens near a fingernail or toenail. It may cause pain and swelling around the nail. In some cases, a fluid-filled bump (abscess) can form near or under the nail.  Usually, this condition is not serious, and it clears up with treatment.  Follow these instructions at home:  Wound care   Keep the affected area clean.   Soak the fingers or toes in warm water as told by your doctor. You may be told to do this for 20 minutes, 2-3 times a day.   Keep the area dry when you are not soaking it.   Do not try to drain a fluid-filled bump on your own.   Follow instructions from your doctor about how to take care of the affected area. Make sure you:  ? Wash your hands with soap and water before you change your bandage (dressing). If you cannot use soap and water, use hand sanitizer.  ? Change your bandage as told by your doctor.   If you had a fluid-filled bump and your doctor drained it, check the area every day for signs of infection. Check for:  ? Redness, swelling, or pain.  ? Fluid or blood.  ? Warmth.  ? Pus or a bad smell.  Medicines     Take over-the-counter and prescription medicines only as told by your doctor.   If you were prescribed an antibiotic medicine, take it as told by your doctor. Do not stop taking it even if you start to feel better.  General instructions   Avoid touching any chemicals.   Do not pick at the affected area.  Prevention   To prevent this condition from happening again:  ? Wear rubber gloves when putting your hands in water for washing dishes or other tasks.  ? Wear gloves if your hands might touch cleaners or chemicals.  ? Avoid injuring your nails or fingertips.  ? Do not bite your nails or tear hangnails.  ? Do not cut your nails very short.  ? Do not cut the skin at the base and sides of the nail (cuticles).  ? Use clean nail clippers or scissors when trimming nails.  Contact a doctor if:   You feel worse.   You do not get  better.   You have more fluid, blood, or pus coming from the affected area.   Your finger or knuckle is swollen or is hard to move.  Get help right away if you have:   A fever or chills.   Redness spreading from the affected area.   Pain in a joint or muscle.  Summary   Paronychia is an infection of the skin. It happens near a fingernail or toenail.   This condition may cause pain and swelling around the nail.   Soak the fingers or toes in warm water as told by your doctor.   Usually, this condition is not serious, and it clears up with treatment.  This information is not intended to replace advice given to you by your health care provider. Make sure you discuss any questions you have with your health care provider.  Document Released: 08/20/2009 Document Revised: 09/14/2017 Document Reviewed: 09/14/2017  Elsevier Interactive Patient Education  2019 [employer]

## 2019-01-25 ENCOUNTER — Ambulatory Visit: Payer: Medicare Other | Admitting: Family Medicine

## 2019-01-31 ENCOUNTER — Other Ambulatory Visit: Payer: Self-pay

## 2019-01-31 ENCOUNTER — Ambulatory Visit (INDEPENDENT_AMBULATORY_CARE_PROVIDER_SITE_OTHER): Payer: Medicare Other | Admitting: Family Medicine

## 2019-01-31 ENCOUNTER — Encounter: Payer: Self-pay | Admitting: Family Medicine

## 2019-01-31 DIAGNOSIS — E113293 Type 2 diabetes mellitus with mild nonproliferative diabetic retinopathy without macular edema, bilateral: Secondary | ICD-10-CM

## 2019-01-31 DIAGNOSIS — F339 Major depressive disorder, recurrent, unspecified: Secondary | ICD-10-CM | POA: Diagnosis not present

## 2019-01-31 DIAGNOSIS — Z794 Long term (current) use of insulin: Secondary | ICD-10-CM

## 2019-01-31 DIAGNOSIS — I10 Essential (primary) hypertension: Secondary | ICD-10-CM | POA: Diagnosis not present

## 2019-01-31 MED ORDER — FLUOXETINE HCL 20 MG PO CAPS: 20.0000 mg | ORAL_CAPSULE | Freq: Every day | ORAL | 1 refills | Status: DC

## 2019-01-31 NOTE — Assessment & Plan Note (Signed)
The current medical regimen is effective;  continue present plan and medications.  

## 2019-01-31 NOTE — Assessment & Plan Note (Signed)
Discussed depression concerns recurrent nature patient's been on medications before without problems was able to sleep.  Will start fluoxetine 20 mg 1 a day and follow-up in 2 weeks

## 2019-01-31 NOTE — Progress Notes (Signed)
There were no vitals taken for this visit.   Subjective:    Patient ID: Jack Estrada, male    DOB: 1946-01-04, 73 y.o.   MRN: [id number]  HPI: Jack Estrada is a 73 y.o. male  Depression med check.  Telemedicine using audio/video telecommunications for a synchronous communication visit. Today's visit due to COVID-19 isolation precautions I connected with and verified that I am speaking with the correct person using two identifiers.   I discussed the limitations, risks, security and privacy concerns of performing an evaluation and management service by telecommunication and the availability of in person appointments. I also discussed with the patient that there may be a patient responsible charge related to this service. The patient expressed understanding and agreed to proceed. The patient's location is home. I am at home.  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  Still having great deal of financial pressure and COVID-19 pressure with patient high risk state.  Trying to maintain social distancing and being cautious. Depression symptoms of increasing irritability fatigue loss of interest in usual things sad blue this is been ongoing for several months now and getting worse.  No suicidal ideations.  Relevant past medical, surgical, family and social history reviewed and updated as indicated. Interim medical history since our last visit reviewed. Allergies and medications reviewed and updated.  Review of Systems  Constitutional: Negative.   Respiratory: Negative.   Cardiovascular: Negative.     Per HPI unless specifically indicated above     Objective:    There were no vitals taken for this visit.  Wt Readings from Last 3 Encounters:  01/19/19 221 lb (100.2 kg)  10/28/18 220 lb (99.8 kg)  07/19/18 233 lb (105.7 kg)    Physical Exam  Results for orders placed or performed in visit on 07/19/18  Bayer  DCA Hb A1c Waived  Result Value Ref Range   HB A1C (BAYER DCA - WAIVED) 7.5 (H) <7.0 %  Lipid panel  Result Value Ref Range   Cholesterol, Total 253 (H) 100 - 199 mg/dL   Triglycerides 641 (HH) 0 - 149 mg/dL   HDL 29 (L) >39 mg/dL   VLDL Cholesterol Cal Comment 5 - 40 mg/dL   LDL Calculated Comment 0 - 99 mg/dL   Chol/HDL Ratio 8.7 (H) 0.0 - 5.0 ratio  Urinalysis, Routine w reflex microscopic  Result Value Ref Range   Specific Gravity, UA 1.025 1.005 - 1.030   pH, UA 5.0 5.0 - 7.5   Color, UA Yellow Yellow   Appearance Ur Clear Clear   Leukocytes, UA Negative Negative   Protein, UA Trace (A) Negative/Trace   Glucose, UA Trace (A) Negative   Ketones, UA Negative Negative   RBC, UA Negative Negative   Bilirubin, UA Negative Negative   Urobilinogen, Ur 0.2 0.2 - 1.0 mg/dL   Nitrite, UA Negative Negative  CBC with Differential/Platelet  Result Value Ref Range   WBC 7.0 3.4 - 10.8 x10E3/uL   RBC 4.70 4.14 - 5.80 x10E6/uL   Hemoglobin 14.6 13.0 - 17.7 g/dL   Hematocrit 42.0 37.5 - 51.0 %   MCV 89 79 - 97 fL   MCH 31.1 26.6 - 33.0 pg   MCHC 34.8 31.5 - 35.7 g/dL   RDW 13.0 12.3 - 15.4 %   Platelets 176 150 - 450 x10E3/uL   Neutrophils 59 Not Estab. %   Lymphs 26 Not Estab. %  Monocytes 9 Not Estab. %   Eos 5 Not Estab. %   Basos 1 Not Estab. %   Neutrophils Absolute 4.2 1.4 - 7.0 x10E3/uL   Lymphocytes Absolute 1.8 0.7 - 3.1 x10E3/uL   Monocytes Absolute 0.6 0.1 - 0.9 x10E3/uL   EOS (ABSOLUTE) 0.3 0.0 - 0.4 x10E3/uL   Basophils Absolute 0.1 0.0 - 0.2 x10E3/uL   Immature Granulocytes 0 Not Estab. %   Immature Grans (Abs) 0.0 0.0 - 0.1 x10E3/uL  Comprehensive metabolic panel  Result Value Ref Range   Glucose 157 (H) 65 - 99 mg/dL   BUN 23 8 - 27 mg/dL   Creatinine, Ser 1.27 0.76 - 1.27 mg/dL   GFR calc non Af Amer 57 (L) >59 mL/min/1.73   GFR calc Af Amer 66 >59 mL/min/1.73   BUN/Creatinine Ratio 18 10 - 24   Sodium 138 134 - 144 mmol/L   Potassium 4.5 3.5 - 5.2  mmol/L   Chloride 100 96 - 106 mmol/L   CO2 21 20 - 29 mmol/L   Calcium 9.3 8.6 - 10.2 mg/dL   Total Protein 7.1 6.0 - 8.5 g/dL   Albumin 4.0 3.5 - 4.8 g/dL   Globulin, Total 3.1 1.5 - 4.5 g/dL   Albumin/Globulin Ratio 1.3 1.2 - 2.2   Bilirubin Total <0.2 0.0 - 1.2 mg/dL   Alkaline Phosphatase 104 39 - 117 IU/L   AST 26 0 - 40 IU/L   ALT 26 0 - 44 IU/L  TSH  Result Value Ref Range   TSH 2.270 0.450 - 4.500 uIU/mL  PSA  Result Value Ref Range   Prostate Specific Ag, Serum 0.6 0.0 - 4.0 ng/mL      Assessment & Plan:   Problem List Items Addressed This Visit      Cardiovascular and Mediastinum   Essential hypertension    The current medical regimen is effective;  continue present plan and medications.         Endocrine   Type 2 DM mild nonproliferative retinopathy, no macular edema, control (HCC)    The current medical regimen is effective;  continue present plan and medications.         Other   Depression, recurrent ([place])    Discussed depression concerns recurrent nature patient's been on medications before without problems was able to sleep.  Will start fluoxetine 20 mg 1 a day and follow-up in 2 weeks      Relevant Medications   FLUoxetine (PROZAC) 20 MG capsule       I discussed the assessment and treatment plan with the patient. The patient was provided an opportunity to ask questions and all were answered. The patient agreed with the plan and demonstrated an understanding of the instructions.   The patient was advised to call back or seek an in-person evaluation if the symptoms worsen or if the condition fails to improve as anticipated.   I provided 21+ minutes of time during this encounter. Follow up plan: Return in about 2 weeks (around 02/14/2019).

## 2019-03-01 ENCOUNTER — Other Ambulatory Visit: Payer: Self-pay

## 2019-03-01 ENCOUNTER — Encounter: Payer: Self-pay | Admitting: Family Medicine

## 2019-03-01 ENCOUNTER — Ambulatory Visit (INDEPENDENT_AMBULATORY_CARE_PROVIDER_SITE_OTHER): Payer: Medicare Other | Admitting: Family Medicine

## 2019-03-01 DIAGNOSIS — E119 Type 2 diabetes mellitus without complications: Secondary | ICD-10-CM | POA: Diagnosis not present

## 2019-03-01 DIAGNOSIS — F339 Major depressive disorder, recurrent, unspecified: Secondary | ICD-10-CM

## 2019-03-01 DIAGNOSIS — E785 Hyperlipidemia, unspecified: Secondary | ICD-10-CM | POA: Diagnosis not present

## 2019-03-01 DIAGNOSIS — Z794 Long term (current) use of insulin: Secondary | ICD-10-CM

## 2019-03-01 DIAGNOSIS — I10 Essential (primary) hypertension: Secondary | ICD-10-CM | POA: Diagnosis not present

## 2019-03-01 DIAGNOSIS — E113293 Type 2 diabetes mellitus with mild nonproliferative diabetic retinopathy without macular edema, bilateral: Secondary | ICD-10-CM

## 2019-03-01 MED ORDER — AMLODIPINE BESYLATE 5 MG PO TABS: 5.0000 mg | ORAL_TABLET | Freq: Every day | ORAL | 2 refills | Status: DC

## 2019-03-01 MED ORDER — FLUOXETINE HCL 20 MG PO CAPS: 20.0000 mg | ORAL_CAPSULE | Freq: Every day | ORAL | 1 refills | Status: DC

## 2019-03-01 MED ORDER — LANTUS SOLOSTAR 100 UNIT/ML ~~LOC~~ SOPN: PEN_INJECTOR | SUBCUTANEOUS | 10 refills | Status: DC

## 2019-03-01 MED ORDER — SILDENAFIL CITRATE 50 MG PO TABS: 50.0000 mg | ORAL_TABLET | Freq: Every day | ORAL | 12 refills | Status: DC | PRN

## 2019-03-01 MED ORDER — ATORVASTATIN CALCIUM 40 MG PO TABS: 40.0000 mg | ORAL_TABLET | Freq: Every day | ORAL | 2 refills | Status: DC

## 2019-03-01 NOTE — Assessment & Plan Note (Signed)
Discussed depression care and treatment will continue fluoxetine recheck 1 month

## 2019-03-01 NOTE — Assessment & Plan Note (Signed)
The current medical regimen is effective;  continue present plan and medications.  

## 2019-03-01 NOTE — Progress Notes (Signed)
BP 120/70    Subjective:    Patient ID: Jack Estrada, male    DOB: 05-24-1946, 73 y.o.   MRN: [id number]  HPI: Jack Estrada is a 73 y.o. male  Depression f/u Discussed with patient nurse may be stabilizing some sleeping okay with normal energy.  No real side effects. Wants to continue medication. Diabetes stabilizing without problems. Blood pressure taking half of an amlodipine with good control of his blood pressure.  Relevant past medical, surgical, family and social history reviewed and updated as indicated. Interim medical history since our last visit reviewed. Allergies and medications reviewed and updated.  Review of Systems  Constitutional: Negative.   Respiratory: Negative.   Cardiovascular: Negative.     Per HPI unless specifically indicated above     Objective:    BP 120/70   Wt Readings from Last 3 Encounters:  01/19/19 221 lb (100.2 kg)  10/28/18 220 lb (99.8 kg)  07/19/18 233 lb (105.7 kg)    Physical Exam      Assessment & Plan:   Problem List Items Addressed This Visit      Cardiovascular and Mediastinum   Essential hypertension    The current medical regimen is effective;  continue present plan and medications.       Relevant Medications   sildenafil (VIAGRA) 50 MG tablet   atorvastatin (LIPITOR) 40 MG tablet   amLODipine (NORVASC) 5 MG tablet   Other Relevant Orders   Basic metabolic panel     Endocrine   Type 2 DM mild nonproliferative retinopathy, no macular edema, control (HCC)    The current medical regimen is effective;  continue present plan and medications.       Relevant Medications   sildenafil (VIAGRA) 50 MG tablet   atorvastatin (LIPITOR) 40 MG tablet   Insulin Glargine (LANTUS SOLOSTAR) 100 UNIT/ML Solostar Pen   Other Relevant Orders   Bayer DCA Hb A1c Waived   LP+ALT+AST Piccolo, Waived     Other   Hyperlipidemia    The current medical regimen is effective;  continue present plan and medications.'      Relevant Medications   sildenafil (VIAGRA) 50 MG tablet   atorvastatin (LIPITOR) 40 MG tablet   amLODipine (NORVASC) 5 MG tablet   Other Relevant Orders   LP+ALT+AST Piccolo, Waived   Depression, recurrent ([place])    Discussed depression care and treatment will continue fluoxetine recheck 1 month      Relevant Medications   FLUoxetine (PROZAC) 20 MG capsule    Other Visit Diagnoses    Diabetes mellitus without complication (HCC)       Relevant Medications   atorvastatin (LIPITOR) 40 MG tablet   Insulin Glargine (LANTUS SOLOSTAR) 100 UNIT/ML Solostar Pen     Telemedicine using audio/video telecommunications for a synchronous communication visit. Today's visit due to COVID-19 isolation precautions I connected with and verified that I am speaking with the correct person using two identifiers.   I discussed the limitations, risks, security and privacy concerns of performing an evaluation and management service by telecommunication and the availability of in person appointments. I also discussed with the patient that there may be a patient responsible charge related to this service. The patient expressed understanding and agreed to proceed. The patient's location is car. I am at home.   I discussed the assessment and treatment plan with the patient. The patient was provided an opportunity to ask questions and all were answered. The patient agreed with the plan  and demonstrated an understanding of the instructions.   The patient was advised to call back or seek an in-person evaluation if the symptoms worsen or if the condition fails to improve as anticipated.   I provided 21+ minutes of time during this encounter.  Follow up plan: Return in about 4 weeks (around 03/29/2019).

## 2019-03-03 ENCOUNTER — Other Ambulatory Visit: Payer: Medicare Other

## 2019-03-03 ENCOUNTER — Other Ambulatory Visit: Payer: Self-pay

## 2019-03-03 DIAGNOSIS — I10 Essential (primary) hypertension: Secondary | ICD-10-CM | POA: Diagnosis not present

## 2019-03-03 DIAGNOSIS — E113293 Type 2 diabetes mellitus with mild nonproliferative diabetic retinopathy without macular edema, bilateral: Secondary | ICD-10-CM

## 2019-03-03 DIAGNOSIS — E785 Hyperlipidemia, unspecified: Secondary | ICD-10-CM | POA: Diagnosis not present

## 2019-03-03 DIAGNOSIS — Z794 Long term (current) use of insulin: Secondary | ICD-10-CM | POA: Diagnosis not present

## 2019-03-03 LAB — LP+ALT+AST PICCOLO, WAIVED
ALT (SGPT) Piccolo, Waived: 35 U/L (ref 10–47)
AST (SGOT) Piccolo, Waived: 29 U/L (ref 11–38)
Chol/HDL Ratio Piccolo,Waive: 3.3 mg/dL
Cholesterol Piccolo, Waived: 151 mg/dL (ref <200)
HDL Chol Piccolo, Waived: 46 mg/dL — ABNORMAL LOW (ref >59)
LDL Chol Calc Piccolo Waived: 94 mg/dL (ref <100)
Triglycerides Piccolo,Waived: 59 mg/dL (ref <150)
VLDL Chol Calc Piccolo,Waive: 12 mg/dL (ref <30)

## 2019-03-03 LAB — BAYER DCA HB A1C WAIVED: HB A1C (BAYER DCA - WAIVED): 7.6 % — ABNORMAL HIGH (ref <7.0)

## 2019-03-04 ENCOUNTER — Encounter: Payer: Self-pay | Admitting: Family Medicine

## 2019-03-04 LAB — BASIC METABOLIC PANEL
BUN/Creatinine Ratio: 15 (ref 10–24)
BUN: 15 mg/dL (ref 8–27)
CO2: 25 mmol/L (ref 20–29)
Calcium: 9.2 mg/dL (ref 8.6–10.2)
Chloride: 106 mmol/L (ref 96–106)
Creatinine, Ser: 1 mg/dL (ref 0.76–1.27)
GFR calc Af Amer: 87 mL/min/{1.73_m2} (ref >59)
GFR calc non Af Amer: 75 mL/min/{1.73_m2} (ref >59)
Glucose: 122 mg/dL — ABNORMAL HIGH (ref 65–99)
Potassium: 4.2 mmol/L (ref 3.5–5.2)
Sodium: 143 mmol/L (ref 134–144)

## 2019-04-01 ENCOUNTER — Other Ambulatory Visit: Payer: Self-pay

## 2019-04-01 ENCOUNTER — Encounter: Payer: Self-pay | Admitting: Family Medicine

## 2019-04-01 ENCOUNTER — Ambulatory Visit (INDEPENDENT_AMBULATORY_CARE_PROVIDER_SITE_OTHER): Payer: Medicare Other | Admitting: Family Medicine

## 2019-04-01 ENCOUNTER — Ambulatory Visit: Payer: Medicare Other | Admitting: Nurse Practitioner

## 2019-04-01 ENCOUNTER — Encounter: Payer: Self-pay | Admitting: Nurse Practitioner

## 2019-04-01 VITALS — BP 132/70

## 2019-04-01 DIAGNOSIS — L989 Disorder of the skin and subcutaneous tissue, unspecified: Secondary | ICD-10-CM | POA: Diagnosis not present

## 2019-04-01 DIAGNOSIS — Z87828 Personal history of other (healed) physical injury and trauma: Secondary | ICD-10-CM

## 2019-04-01 MED ORDER — SILVER SULFADIAZINE 1 % EX CREA: 1.0000 "application " | TOPICAL_CREAM | Freq: Every day | CUTANEOUS | 0 refills | Status: DC

## 2019-04-01 MED ORDER — SULFAMETHOXAZOLE-TRIMETHOPRIM 800-160 MG PO TABS: 1.0000 | ORAL_TABLET | Freq: Two times a day (BID) | ORAL | 0 refills | Status: DC

## 2019-04-01 NOTE — Progress Notes (Deleted)
Could not get video working.  Added on to in office at 10 am.

## 2019-04-01 NOTE — Progress Notes (Signed)
BP 132/70    Subjective:    Patient ID: Jack Estrada, male    DOB: April 03, 1946, 73 y.o.   MRN: [id number]  HPI: Jack Estrada is a 73 y.o. male  Chief Complaint  Patient presents with  . Skin Problem   SKIN LESION Duration: 2 days Location:  Inner L thigh Painful: no Itching: no Onset: sudden Context: not changing History of skin cancer: no  Relevant past medical, surgical, family and social history reviewed and updated as indicated. Interim medical history since our last visit reviewed. Allergies and medications reviewed and updated.  Review of Systems  Constitutional: Negative.   Respiratory: Negative.   Cardiovascular: Negative.   Skin: Positive for rash and wound. Negative for color change and pallor.  Psychiatric/Behavioral: Negative.     Per HPI unless specifically indicated above     Objective:    BP 132/70   Wt Readings from Last 3 Encounters:  01/19/19 221 lb (100.2 kg)  10/28/18 220 lb (99.8 kg)  07/19/18 233 lb (105.7 kg)    Physical Exam Vitals signs and nursing note reviewed.  Constitutional:      General: He is not in acute distress.    Appearance: Normal appearance. He is not ill-appearing, toxic-appearing or diaphoretic.  HENT:     Head: Normocephalic and atraumatic.     Right Ear: External ear normal.     Left Ear: External ear normal.     Nose: Nose normal.     Mouth/Throat:     Mouth: Mucous membranes are moist.     Pharynx: Oropharynx is clear.  Eyes:     General: No scleral icterus.       Right eye: No discharge.        Left eye: No discharge.     Extraocular Movements: Extraocular movements intact.     Conjunctiva/sclera: Conjunctivae normal.     Pupils: Pupils are equal, round, and reactive to light.  Neck:     Musculoskeletal: Normal range of motion and neck supple.  Cardiovascular:     Rate and Rhythm: Normal rate and regular rhythm.     Pulses: Normal pulses.     Heart sounds: Normal heart sounds. No murmur. No friction  rub. No gallop.   Pulmonary:     Effort: Pulmonary effort is normal. No respiratory distress.     Breath sounds: Normal breath sounds. No stridor. No wheezing, rhonchi or rales.  Chest:     Chest wall: No tenderness.  Musculoskeletal: Normal range of motion.  Skin:    General: Skin is warm and dry.     Capillary Refill: Capillary refill takes less than 2 seconds.     Coloration: Skin is not jaundiced or pale.     Findings: No bruising, erythema, lesion or rash.     Comments: 1 inch ulcer on anterior inner L thigh, erythema around the border  Neurological:     General: No focal deficit present.     Mental Status: He is alert and oriented to person, place, and time. Mental status is at baseline.  Psychiatric:        Mood and Affect: Mood normal.        Behavior: Behavior normal.        Thought Content: Thought content normal.        Judgment: Judgment normal.     Results for orders placed or performed in visit on 03/03/19  LP+ALT+AST Piccolo, Waived  Result Value Ref Range  ALT (SGPT) Piccolo, Waived 35 10 - 47 U/L   AST (SGOT) Piccolo, Waived 29 11 - 38 U/L   Cholesterol Piccolo, Waived 151 <200 mg/dL   HDL Chol Piccolo, Waived 46 (L) >59 mg/dL   Triglycerides Piccolo,Waived 59 <150 mg/dL   Chol/HDL Ratio Piccolo,Waive 3.3 mg/dL   LDL Chol Calc Piccolo Waived 94 <100 mg/dL   VLDL Chol Calc Piccolo,Waive 12 <30 mg/dL  Basic metabolic panel  Result Value Ref Range   Glucose 122 (H) 65 - 99 mg/dL   BUN 15 8 - 27 mg/dL   Creatinine, Ser 1.00 0.76 - 1.27 mg/dL   GFR calc non Af Amer 75 >59 mL/min/1.73   GFR calc Af Amer 87 >59 mL/min/1.73   BUN/Creatinine Ratio 15 10 - 24   Sodium 143 134 - 144 mmol/L   Potassium 4.2 3.5 - 5.2 mmol/L   Chloride 106 96 - 106 mmol/L   CO2 25 20 - 29 mmol/L   Calcium 9.2 8.6 - 10.2 mg/dL  Bayer DCA Hb A1c Waived  Result Value Ref Range   HB A1C (BAYER DCA - WAIVED) 7.6 (H) <7.0 %      Assessment & Plan:   Problem List Items Addressed  This Visit    None    Visit Diagnoses    Skin lesion    -  Primary   Up to date on his tetanus shot. Given burn-like apperance, will treat with silvadene and bactrim. Continue to monitor. Call with any concerns.       Follow up plan: Return if symptoms worsen or fail to improve.

## 2019-04-02 ENCOUNTER — Encounter: Payer: Self-pay | Admitting: Nurse Practitioner

## 2019-04-02 NOTE — Progress Notes (Signed)
Switched to in office visit due to patient being unable to get telephone video working.

## 2019-04-03 ENCOUNTER — Encounter: Payer: Self-pay | Admitting: Family Medicine

## 2019-05-09 ENCOUNTER — Other Ambulatory Visit: Payer: Self-pay

## 2019-05-09 ENCOUNTER — Ambulatory Visit: Payer: Medicare Other

## 2019-05-09 ENCOUNTER — Ambulatory Visit (INDEPENDENT_AMBULATORY_CARE_PROVIDER_SITE_OTHER): Payer: Medicare Other | Admitting: Family Medicine

## 2019-05-09 ENCOUNTER — Encounter: Payer: Self-pay | Admitting: Family Medicine

## 2019-05-09 DIAGNOSIS — E113293 Type 2 diabetes mellitus with mild nonproliferative diabetic retinopathy without macular edema, bilateral: Secondary | ICD-10-CM | POA: Diagnosis not present

## 2019-05-09 DIAGNOSIS — F339 Major depressive disorder, recurrent, unspecified: Secondary | ICD-10-CM

## 2019-05-09 DIAGNOSIS — I10 Essential (primary) hypertension: Secondary | ICD-10-CM | POA: Diagnosis not present

## 2019-05-09 DIAGNOSIS — Z794 Long term (current) use of insulin: Secondary | ICD-10-CM | POA: Diagnosis not present

## 2019-05-09 DIAGNOSIS — F419 Anxiety disorder, unspecified: Secondary | ICD-10-CM

## 2019-05-09 MED ORDER — ARIPIPRAZOLE 5 MG PO TABS: 5.0000 mg | ORAL_TABLET | Freq: Every day | ORAL | 3 refills | Status: DC

## 2019-05-09 MED ORDER — LORAZEPAM 1 MG PO TABS: 1.0000 mg | ORAL_TABLET | Freq: Every day | ORAL | 0 refills | Status: DC | PRN

## 2019-05-09 NOTE — Assessment & Plan Note (Signed)
Discussed anxiety care and treatment limited use of lorazepam for stormy days not rainy days that his supply of 30 should last a year or more.

## 2019-05-09 NOTE — Progress Notes (Addendum)
BP 129/78   Wt 201 lb (91.2 kg)   BMI 27.26 kg/m    Subjective:    Patient ID: Jack Estrada, male    DOB: 05-Nov-1945, 73 y.o.   MRN: [id number]  HPI: Jack Estrada is a 73 y.o. male  Med check Discussed with patient anxiety nerves are getting worse.  Taking fluoxetine without problems but having spells of getting very mad and losing his temper. Patient has had continuing stress with loss of his job divorce and son just getting married last week. Also had spell of dehydration with some resulting dizziness has been drinking more water and dizziness and dehydration has resolved.   Discussed diabetes that is been staying in good control along with blood pressure.  Patient has lost considerable weight with this stress. Asking about Xanax which was discussed.  Relevant past medical, surgical, family and social history reviewed and updated as indicated. Interim medical history since our last visit reviewed. Allergies and medications reviewed and updated.  Review of Systems  Constitutional: Negative.   Respiratory: Negative.   Cardiovascular: Negative.     Per HPI unless specifically indicated above     Objective:    BP 129/78   Wt 201 lb (91.2 kg)   BMI 27.26 kg/m   Wt Readings from Last 3 Encounters:  05/09/19 201 lb (91.2 kg)  01/19/19 221 lb (100.2 kg)  10/28/18 220 lb (99.8 kg)    Physical Exam  Results for orders placed or performed in visit on 03/03/19  LP+ALT+AST Piccolo, [employer]  Result Value Ref Range   ALT (SGPT) Piccolo, Waived 35 10 - 47 U/L   AST (SGOT) Piccolo, Waived 29 11 - 38 U/L   Cholesterol Piccolo, Waived 151 <200 mg/dL   HDL Chol Piccolo, Waived 46 (L) >59 mg/dL   Triglycerides Piccolo,Waived 59 <150 mg/dL   Chol/HDL Ratio Piccolo,Waive 3.3 mg/dL   LDL Chol Calc Piccolo Waived 94 <100 mg/dL   VLDL Chol Calc Piccolo,Waive 12 <30 mg/dL  Basic metabolic panel  Result Value Ref Range   Glucose 122 (H) 65 - 99 mg/dL   BUN 15 8 - 27 mg/dL   Creatinine, Ser 1.00 0.76 - 1.27 mg/dL   GFR calc non Af Amer 75 >59 mL/min/1.73   GFR calc Af Amer 87 >59 mL/min/1.73   BUN/Creatinine Ratio 15 10 - 24   Sodium 143 134 - 144 mmol/L   Potassium 4.2 3.5 - 5.2 mmol/L   Chloride 106 96 - 106 mmol/L   CO2 25 20 - 29 mmol/L   Calcium 9.2 8.6 - 10.2 mg/dL  Bayer DCA Hb A1c Waived  Result Value Ref Range   HB A1C (BAYER DCA - WAIVED) 7.6 (H) <7.0 %      Assessment & Plan:   Problem List Items Addressed This Visit      Cardiovascular and Mediastinum   Essential hypertension    The current medical regimen is effective;  continue present plan and medications.         Endocrine   Type 2 DM mild nonproliferative retinopathy, no macular edema, control (HCC)    Apparent good control        Other   Depression, recurrent (HCC)    Discussed recurrent depression will add Abilify 5 mg      Relevant Medications   LORazepam (ATIVAN) 1 MG tablet   Anxiety    Discussed anxiety care and treatment limited use of lorazepam for stormy days not rainy days that his  supply of 30 should last a year or more.      Relevant Medications   LORazepam (ATIVAN) 1 MG tablet       Telemedicine using audio/video telecommunications for a synchronous communication visit. Today's visit due to COVID-19 isolation precautions I connected with and verified that I am speaking with the correct person using two identifiers.   I discussed the limitations, risks, security and privacy concerns of performing an evaluation and management service by telecommunication and the availability of in person appointments. I also discussed with the patient that there may be a patient responsible charge related to this service. The patient expressed understanding and agreed to proceed. The patient's location is home. I am at home.   I discussed the assessment and treatment plan with the patient. The patient was provided an opportunity to ask questions and all were answered. The  patient agreed with the plan and demonstrated an understanding of the instructions.   The patient was advised to call back or seek an in-person evaluation if the symptoms worsen or if the condition fails to improve as anticipated.   I provided 21+ minutes of time during this encounter. Follow up plan: Return in about 4 weeks (around 06/06/2019) for Hemoglobin A1c and recheck adding of Abilify.

## 2019-05-09 NOTE — Assessment & Plan Note (Signed)
Discussed recurrent depression will add Abilify 5 mg

## 2019-05-09 NOTE — Assessment & Plan Note (Signed)
The current medical regimen is effective;  continue present plan and medications.  

## 2019-05-09 NOTE — Assessment & Plan Note (Signed)
Apparent good control

## 2019-05-24 ENCOUNTER — Ambulatory Visit: Payer: Self-pay | Admitting: *Deleted

## 2019-05-24 NOTE — Telephone Encounter (Signed)
Can you review this since Dr.Crissman is on vacation this week

## 2019-05-24 NOTE — Telephone Encounter (Signed)
Pt reports "Shooting pain from base of neck, towards left side, shoots up back of head." Onset 8pm yesterday. States "I got really upset yesterday and my entire head hurt, then at 8p moved to base of neck." States awake all night with it. Worsens with moving neck, head. Also reports nausea. Denies any visual changes, weakness, no dizziness. Does not recall any injury. Does not check BP at home. Pt requesting appt, "To see what's going on with this."  Has not tried ice/heat. Has not taken any meds for pain control. TN called practice, Christan, will route to practice for review. Pt aware.  Care advise given. CB# [phone]  Reason for Disposition . [1] New headache AND [2] age > 65  Answer Assessment - Initial Assessment Questions 1. LOCATION: "Where does it hurt?"      BAse of neck, more to left side 2. ONSET: "When did the headache start?" (Minutes, hours or days)     8pm last night 3. PATTERN: "Does the pain come and go, or has it been constant since it started?"     "Shooting pain" 4. SEVERITY: "How bad is the pain?" and "What does it keep you from doing?"  (e.g., Scale 1-10; mild, moderate, or severe)   - MILD (1-3): doesn't interfere with normal activities    - MODERATE (4-7): interferes with normal activities or awakens from sleep    - SEVERE (8-10): excruciating pain, unable to do any normal activities       Moderate when occurs 5. RECURRENT SYMPTOM: "Have you ever had headaches before?" If so, ask: "When was the last time?" and "What happened that time?"      no 6. CAUSE: "What do you think is causing the headache?"     "Under a lot of stress lately. I go really upset yesterday 7. MIGRAINE: "Have you been diagnosed with migraine headaches?" If so, ask: "Is this headache similar?"     40 years ago 8. HEAD INJURY: "Has there been any recent injury to the head?"      no 9. OTHER SYMPTOMS: "Do you have any other symptoms?" (fever, stiff neck, eye pain, sore throat, cold symptoms)     Nausea, pain is shooting, from back "Up through back of jead."  Protocols used: HEADACHE-A-AH

## 2019-05-25 NOTE — Telephone Encounter (Signed)
Called pt to schedule, he states that he is about 80% percent better and thinks that the headache came from stress will see how he feels by Friday. He does not want to schedule an appt right now.

## 2019-05-25 NOTE — Telephone Encounter (Signed)
Needs appointment

## 2019-06-01 ENCOUNTER — Ambulatory Visit: Payer: Medicare Other | Admitting: Family Medicine

## 2019-06-01 ENCOUNTER — Other Ambulatory Visit: Payer: Self-pay

## 2019-06-01 ENCOUNTER — Ambulatory Visit: Payer: Medicare Other

## 2019-06-02 ENCOUNTER — Other Ambulatory Visit: Payer: Self-pay

## 2019-06-02 ENCOUNTER — Ambulatory Visit (INDEPENDENT_AMBULATORY_CARE_PROVIDER_SITE_OTHER): Payer: Medicare Other | Admitting: Unknown Physician Specialty

## 2019-06-02 ENCOUNTER — Encounter: Payer: Self-pay | Admitting: Unknown Physician Specialty

## 2019-06-02 VITALS — BP 125/75 | HR 70 | Temp 98.8°F

## 2019-06-02 DIAGNOSIS — A630 Anogenital (venereal) warts: Secondary | ICD-10-CM

## 2019-06-02 MED ORDER — IMIQUIMOD 5 % EX CREA: TOPICAL_CREAM | CUTANEOUS | 0 refills | Status: DC

## 2019-06-02 NOTE — Patient Instructions (Signed)
Genital Warts Genital warts are a common STD (sexually transmitted disease). They may appear as small bumps on the skin of the genital and anal areas. They sometimes become irritated and cause pain. Genital warts are easily passed to other people through sexual contact. Many people do not know that they are infected. They may be infected for years without symptoms. However, even if they do not have symptoms, they can pass the infection to their sexual partners. Getting treatment is important because genital warts can lead to other problems. In females, the virus that causes genital warts may increase the risk for cervical cancer. What are the causes? This condition is caused by a virus that is called human papillomavirus (HPV). HPV is spread by having unprotected sex with an infected person. It can be spread through vaginal, anal, and oral sex. What increases the risk? You are more likely to develop this condition if:  You have unprotected sex.  You have multiple sexual partners.  You are sexually active before age 16.  You are a man who isnot circumcised.  You have a male sexual partner who is not circumcised.  You have a weakened body defense system (immune system) due to disease or medicine.  You smoke. What are the signs or symptoms? Symptoms of this condition include:  Small growths in the genital area or anal area. These warts often grow in clusters.  Itching and irritation in the genital area or anal area.  Bleeding from the warts.  Pain during sex. How is this diagnosed? This condition is diagnosed based on:  Your symptoms.  A physical exam. You may also have other tests, including:  Biopsy. A tissue sample is removed so it can be checked under a microscope.  Colposcopy. In females, a magnifying tool is used to examine the vagina and cervix. Certain solutions may be used to make the HPV cells change color so they can be seen more easily.  A Pap test in females.   Tests for other STDs. How is this treated? This condition may be treated with:  Medicines, such as: ? Solutions or creams applied to your skin (topical).  Procedures, such as: ? Freezing the warts with liquid nitrogen (cryotherapy). ? Burning the warts with a laser or electric probe (electrocautery). ? Surgery to remove the warts. Follow these instructions at home: Medicines   Apply over-the-counter and prescription medicines only as told by your health care provider.  Do not treat genital warts with medicines that are used for treating hand warts.  Talk with your health care provider about using over-the-counter anti-itch creams. Instructions for women  Get screened regularly for cervical cancer. Women who have genital warts are at an increased risk for this cancer. This type of cancer is slow growing and can be cured if it is found early.  If you become pregnant, tell your health care provider that you have had an HPV infection. Your health care provider will monitor you closely during pregnancy to be sure that your baby is safe. General instructions  Do not touch or scratch the warts.  Do not have sex until your treatment has been completed.  Tell your current and past sexual partners about your condition because they may also need treatment.  After treatment, use condoms during sex to prevent future infections.  Keep all follow-up visits as told by your health care provider. This is important. How is this prevented? Talk with your health care provider about getting the HPV vaccine. The vaccine:    Can prevent some HPV infections and cancers.  Is recommended for males and females who are 63-40 years old.  Is not recommended for pregnant women.  Will not work if you already have HPV. Contact a health care provider if you:  Have redness, swelling, or pain in the area of the treated skin.  Have a fever.  Feel generally ill.  Feel lumps in and around your genital or  anal area.  Have bleeding in your genital or anal area.  Have pain during sex. Summary  Genital warts are a common STD (sexually transmitted disease). It may appear as small bumps on the genital and anal areas.  This condition is caused by a virus that is called human papillomavirus (HPV). HPV is spread by having unprotected sex with an infected person. It can be spread through vaginal, anal, and oral sex.  Treatment is important because genital warts can lead to other problems. In females, the virus that causes genital warts may increase the risk for cervical cancer.  This condition may be treated with medicine that is applied to the skin, or procedures to remove the warts.  The HPV vaccine can prevent some HPV infections and cancers. It is recommended that the vaccine be given to males and females who are 4-68 years old. This information is not intended to replace advice given to you by your health care provider. Make sure you discuss any questions you have with your health care provider. Document Released: 08/29/2000 Document Revised: 10/06/2017 Document Reviewed: 10/06/2017 Elsevier Patient Education  2020 [name].  Human Papillomavirus Human papillomavirus (HPV) is the most common sexually transmitted infection (STI). It spreads easily from person to person (ishighly contagious). There are many types of HPV. It usually does not cause symptoms. However, sometimes it may cause wart-like lesions in the throat or warts in the genital area. It is possible to be infected for a long time and pass HPV to others without knowing it. Certain types of HPV may cause cancers, including cancer of the lower part of the uterus (cervix), vagina, outer male genital area (vulva), penis, anus, and rectum. HPV may also cause cancers of the oral cavity, such as the throat, tongue, and tonsils. What are the causes? HPV is caused by a virus that spreads from person to person through oral, vaginal, or  anal sex. What increases the risk? You may be more likely to develop this condition if you have or have had:  Unprotected oral, vaginal, or anal sex.  Several sex partners.  A sex partner who has other sex partners.  Another STI.  A weak disease-fighting system (immune system).  Damaged skin in the genital, oral, or anal area. What are the signs or symptoms? Most people who have HPV do not have any symptoms. If symptoms are present, they may include:  Wart-like lesions in the throat (from having oral sex).  Warts on the infected skin or mucous membranes.  Genital warts that may itch, burn, bleed, or be painful during sex. How is this diagnosed? If you have wart-like lumps in the anal area or throat, or if genital warts are present, your health care provider can usually diagnose HPV with a physical exam. Genital warts are easily seen. In females, tests may be used to diagnose HPV, including:  A Pap test. A Pap test takes a sample of cells from the cervix to check for cancer and HPV infection.  An HPV test. This is similar to a Pap test and involves taking  a sample of cells from the cervix.  Using a scope to view the cervix (colposcopy). This may be done if a pelvic exam or Pap test is abnormal. A sample of tissue may be removed for testing (biopsy) during the colposcopy. Currently, there is no test to detect HPV in males. How is this treated? There is no treatment for the virus itself. However, there are treatments for the health problems and symptoms HPV can cause. Treatment for HPV may include:  Medicines in a cream, lotion, liquid, or gel form. These medicines may be injected into or applied to genital or anal warts.  Use of a probe to apply extreme cold (cryotherapy) to the genital or anal warts.  Application of an intense beam of light (laser treatment) on the genital or anal warts.  Use of a probe to apply extreme heat (electrocautery) on the genital or anal warts.   Surgery to remove the genital or anal warts. Your health care provider will monitor you closely after you are treated. HPV can come back and you may need treatment again. Follow these instructions at home: Medicines  Take over-the-counter and prescription medicines only as told by your health care provider. This include creams for itching or irritation.  Do not treat genital or anal warts with medicines used for treating hand warts. General instructions  Do not touch or scratch the warts.  Do not have sex while you are being treated.  Do not douche or use tampons during treatment (for women).  Tell your sex partner about your infection. He or she may also need to be treated.  If you become pregnant, tell your health care provider that you have HPV. Your health care provider will monitor you closely during pregnancy to make sure you and your baby are safe.  Keep all follow-up visits as told by your health care provider. This is important. How is this prevented?  Talk with your health care provider about getting an HPV vaccine, which can prevent some HPV infections and related cancers. It will not work if you already have HPV and is not recommended for pregnant women. You may need 2-3 doses of the vaccine, depending on your age.  After treatment, use condoms during sex to prevent future infections.  Have only one sex partner.  Have a sex partner who does not have other sex partners.  Get regular Pap tests as directed by your health care provider. Contact a health care provider if:  The treated skin becomes red, swollen, or painful.  You have a fever.  You feel generally ill.  You feel lumps or pimples in and around your genital or anal area.  You develop bleeding of the vagina or the treatment area.  You have painful sex. Summary  Human papillomavirus (HPV) is the most common sexually transmitted infection (STI) and is highly contagious.  Most people carrying HPV do not  have any symptoms.  Many forms of HPV can be prevented with vaccination.  There is no treatment for the virus itself. However, there are treatments for the health problems and symptoms HPV can cause. This information is not intended to replace advice given to you by your health care provider. Make sure you discuss any questions you have with your health care provider. Document Released: 11/22/2003 Document Revised: 04/29/2018 Document Reviewed: 04/29/2018 Elsevier Patient Education  2020 [employer].

## 2019-06-02 NOTE — Progress Notes (Signed)
BP 125/75   Pulse 70   Temp 98.8 F (37.1 C) (Oral)   SpO2 96%    Subjective:    Patient ID: Jack Estrada, male    DOB: Sep 27, 1945, 73 y.o.   MRN: [id number]  HPI: Jack Estrada is a 73 y.o. male  Chief Complaint  Patient presents with  . other    pt states he has a spot on his penis that he has never seen before, states he noticed it 2 or 3 days ago    New spot on penis for 2 days.  States burning sensation 2 weeks ago.  He did hit the commode while at a family reunion in [place].  No new sexual contact since April.  No other rashes, fever, or abdominal concerns.    Current Outpatient Medications on File Prior to Visit  Medication Sig Dispense Refill  . amLODipine (NORVASC) 5 MG tablet Take 1 tablet (5 mg total) by mouth daily. 90 tablet 2  . ARIPiprazole (ABILIFY) 5 MG tablet Take 1 tablet (5 mg total) by mouth daily. 30 tablet 3  . atorvastatin (LIPITOR) 40 MG tablet Take 1 tablet (40 mg total) by mouth daily. 90 tablet 2  . BAYER MICROLET LANCETS lancets Use to check sugar twice a day 100 each 2  . esomeprazole (NEXIUM) 20 MG capsule Take 20 mg by mouth every other day.    Marland Kitchen FLUoxetine (PROZAC) 20 MG capsule Take 1 capsule (20 mg total) by mouth daily. 90 capsule 1  . glucose blood (BAYER CONTOUR TEST) test strip Use to check sugar twice a day 100 each 2  . Insulin Glargine (LANTUS SOLOSTAR) 100 UNIT/ML Solostar Pen INJECT 50 UNITS UNDER THE SKIN EVERY MORNING 15 mL 10  . insulin lispro (HUMALOG KWIKPEN) 100 UNIT/ML KiwkPen INJECT 20 TO 30 UNITS INTO THE SKIN THREE TIMES DAILY WITH MEALS 15 mL 10  . LORazepam (ATIVAN) 1 MG tablet Take 1 tablet (1 mg total) by mouth daily as needed for anxiety. 30 tablet 0  . sildenafil (VIAGRA) 50 MG tablet Take 1-2 tablets (50-100 mg total) by mouth daily as needed for erectile dysfunction. 30 tablet 12  . silver sulfADIAZINE (SILVADENE) 1 % cream Apply 1 application topically daily. 50 g 0   No current facility-administered medications on file  prior to visit.      Relevant past medical, surgical, family and social history reviewed and updated as indicated. Interim medical history since our last visit reviewed. Allergies and medications reviewed and updated.  Review of Systems  Constitutional: Negative.   Respiratory: Negative.   Cardiovascular: Negative.   Psychiatric/Behavioral: Negative.     Per HPI unless specifically indicated above     Objective:    BP 125/75   Pulse 70   Temp 98.8 F (37.1 C) (Oral)   SpO2 96%   Wt Readings from Last 3 Encounters:  05/09/19 201 lb (91.2 kg)  01/19/19 221 lb (100.2 kg)  10/28/18 220 lb (99.8 kg)    Physical Exam Constitutional:      General: He is not in acute distress.    Appearance: Normal appearance. He is well-developed.  HENT:     Head: Normocephalic and atraumatic.  Eyes:     General: Lids are normal. No scleral icterus.       Right eye: No discharge.        Left eye: No discharge.     Conjunctiva/sclera: Conjunctivae normal.  Cardiovascular:     Rate and Rhythm:  Normal rate.  Pulmonary:     Effort: Pulmonary effort is normal.  Abdominal:     Palpations: There is no hepatomegaly or splenomegaly.  Genitourinary:    Penis: Circumcised.      Comments: Pt with a cluster of warts at the 11A position under head of penis.   Musculoskeletal: Normal range of motion.  Skin:    Coloration: Skin is not pale.     Findings: No rash.  Neurological:     Mental Status: He is alert and oriented to person, place, and time.  Psychiatric:        Behavior: Behavior normal.        Thought Content: Thought content normal.        Judgment: Judgment normal.     Results for orders placed or performed in visit on 03/03/19  LP+ALT+AST Piccolo, [employer]  Result Value Ref Range   ALT (SGPT) Piccolo, Waived 35 10 - 47 U/L   AST (SGOT) Piccolo, Waived 29 11 - 38 U/L   Cholesterol Piccolo, Waived 151 <200 mg/dL   HDL Chol Piccolo, Waived 46 (L) >59 mg/dL   Triglycerides  Piccolo,Waived 59 <150 mg/dL   Chol/HDL Ratio Piccolo,Waive 3.3 mg/dL   LDL Chol Calc Piccolo Waived 94 <100 mg/dL   VLDL Chol Calc Piccolo,Waive 12 <30 mg/dL  Basic metabolic panel  Result Value Ref Range   Glucose 122 (H) 65 - 99 mg/dL   BUN 15 8 - 27 mg/dL   Creatinine, Ser 1.00 0.76 - 1.27 mg/dL   GFR calc non Af Amer 75 >59 mL/min/1.73   GFR calc Af Amer 87 >59 mL/min/1.73   BUN/Creatinine Ratio 15 10 - 24   Sodium 143 134 - 144 mmol/L   Potassium 4.2 3.5 - 5.2 mmol/L   Chloride 106 96 - 106 mmol/L   CO2 25 20 - 29 mmol/L   Calcium 9.2 8.6 - 10.2 mg/dL  Bayer DCA Hb A1c Waived  Result Value Ref Range   HB A1C (BAYER DCA - WAIVED) 7.6 (H) <7.0 %      Assessment & Plan:   Problem List Items Addressed This Visit    None    Visit Diagnoses    Genital warts    -  Primary   Relevant Medications   imiquimod (ALDARA) 5 % cream (Start on 06/03/2019)      Pt ed on HPV and warts.  Discussed it can be transmitted to partner.  Start Aldara 3 times/week.  Leave on for 8 hours and wash off  Follow up plan: Return if symptoms worsen or fail to improve.

## 2019-06-08 ENCOUNTER — Ambulatory Visit: Payer: Medicare Other | Admitting: Nurse Practitioner

## 2019-06-11 ENCOUNTER — Telehealth: Payer: Self-pay | Admitting: Family Medicine

## 2019-06-12 NOTE — Telephone Encounter (Signed)
Requested medication (s) are due for refill today: yes  Requested medication (s) are on the active medication list:yes  Last refill:  04/01/2019  Future visit scheduled: no  Notes to clinic: review for refill Ordering provider and pcp are different    Requested Prescriptions  Pending Prescriptions Disp Refills   SSD 1 % cream [Pharmacy Med Name: SILVER SULFADIAZINE 1% CREAM 50GM] 50 g 0    Sig: APPLY EXTERNALLY TO THE AFFECTED AREA DAILY     Off-Protocol Failed - 06/11/2019 11:42 AM      Failed - Medication not assigned to a protocol, review manually.      Passed - Valid encounter within last 12 months    Recent Outpatient Visits          1 week ago Genital warts   [place] Kathrine Haddock, NP   1 month ago Essential hypertension   [place] Crissman, Jeannette How, MD   2 months ago Skin lesion   [place], Megan P, DO   2 months ago History of open leg wound   [place], Jolene T, NP   3 months ago Controlled type 2 diabetes mellitus with both eyes affected by mild nonproliferative retinopathy without macular edema, with long-term current use of insulin [facility])   [facility] Crissman, Jeannette How, MD

## 2019-06-13 NOTE — Telephone Encounter (Signed)
Patient was told that the medication was sent to the pharmacy.

## 2019-06-13 NOTE — Telephone Encounter (Signed)
Routing to proivder

## 2019-06-13 NOTE — Telephone Encounter (Signed)
Pt calling to check on the status of this refill request.  States that he has a sore on his bottom that he uses this for and has nothing right now to put on it. Pt can be reached at [phone]

## 2019-06-13 NOTE — Telephone Encounter (Signed)
Tried to call and notify patient that medication was sent, no answer, unable to leave a message.

## 2019-06-15 ENCOUNTER — Ambulatory Visit: Payer: Medicare Other | Admitting: Family Medicine

## 2019-06-16 DIAGNOSIS — L0231 Cutaneous abscess of buttock: Secondary | ICD-10-CM | POA: Diagnosis not present

## 2019-07-18 DIAGNOSIS — L0231 Cutaneous abscess of buttock: Secondary | ICD-10-CM | POA: Diagnosis not present

## 2019-07-18 DIAGNOSIS — K610 Anal abscess: Secondary | ICD-10-CM | POA: Diagnosis not present

## 2019-08-11 IMAGING — MR MR LUMBAR SPINE W/O CM
4 of 5 series · 24 of 48 positions shown · non-contrast
Comparison: Chest and rib radiographs 10/15/2016.

CLINICAL DATA: 71-year-old male status post fall in [REDACTED]. Lumbar
back pain radiating to the right buttock and leg down to the foot.
Weakness and foot tingling. Symptoms exacerbated by driving.

EXAM:
MRI LUMBAR SPINE WITHOUT CONTRAST
TECHNIQUE: Multiplanar, multisequence MR imaging of the lumbar spine was
performed. No intravenous contrast was administered.

[Series 2: T2 · sagittal · 4.0mm · 0.81mm/px · 6 of 15 slices shown (1 of 2)]
[im 1/15]
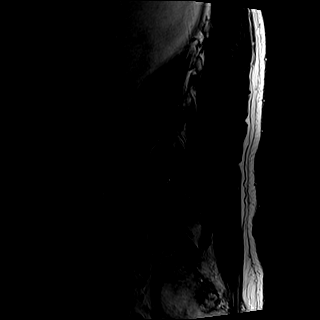
[im 3/15]
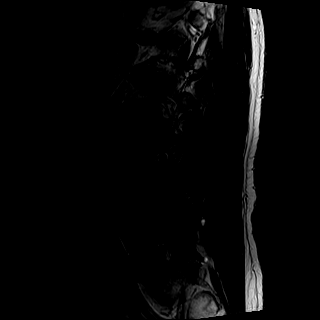
[im 6/15]
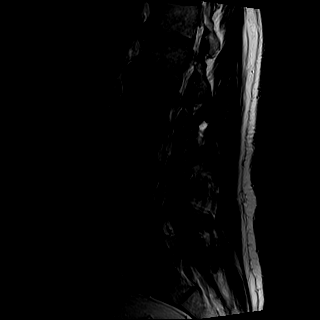
[im 9/15]
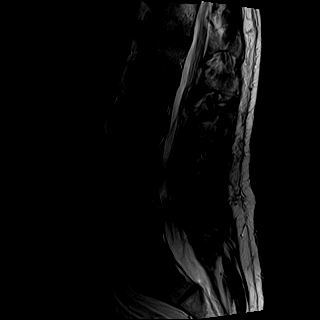
[im 12/15]
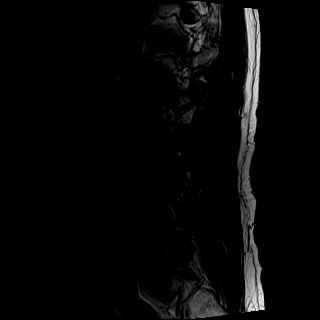
[im 15/15]
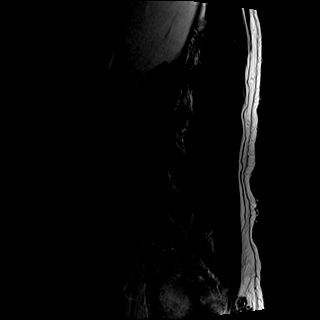

[Series 3: T1 · sagittal · 4.0mm · 0.41mm/px · 6 of 15 slices shown (1 of 2)]
[im 1/15]
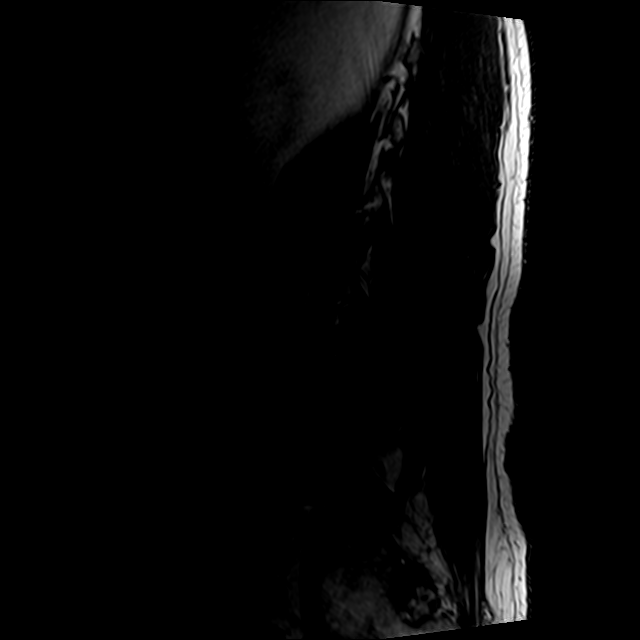
[im 3/15]
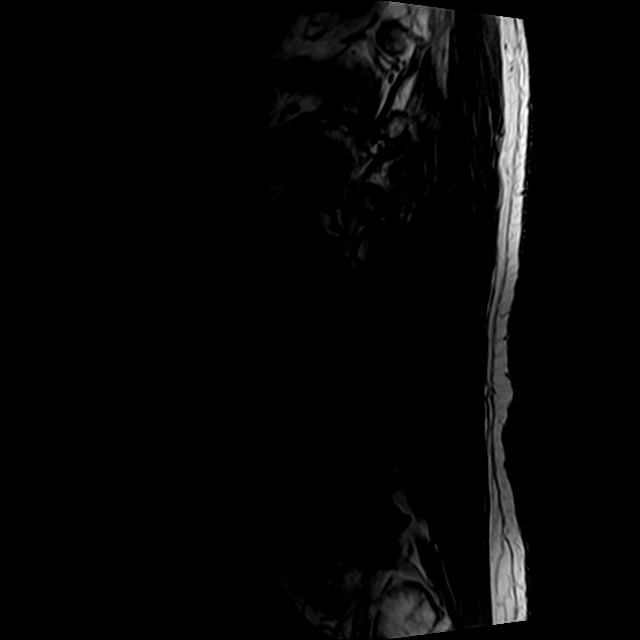
[im 6/15]
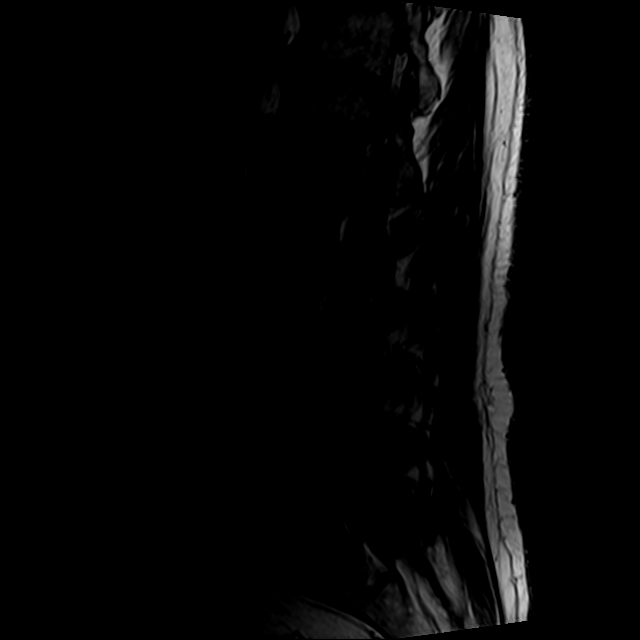
[im 9/15]
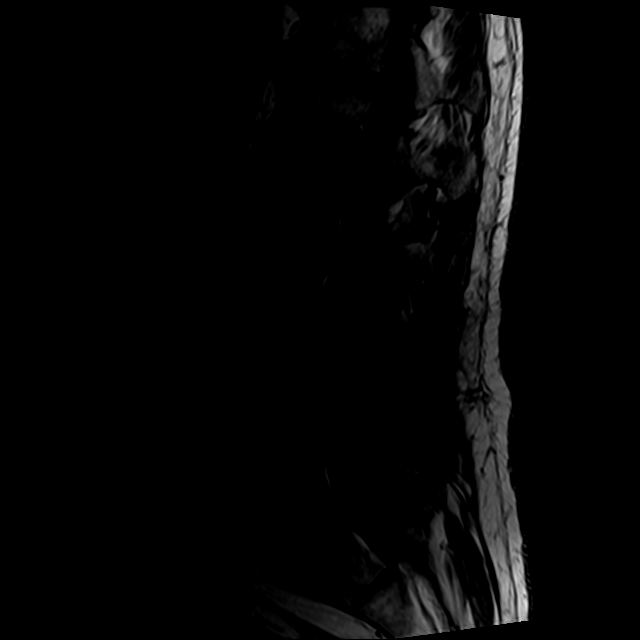
[im 12/15]
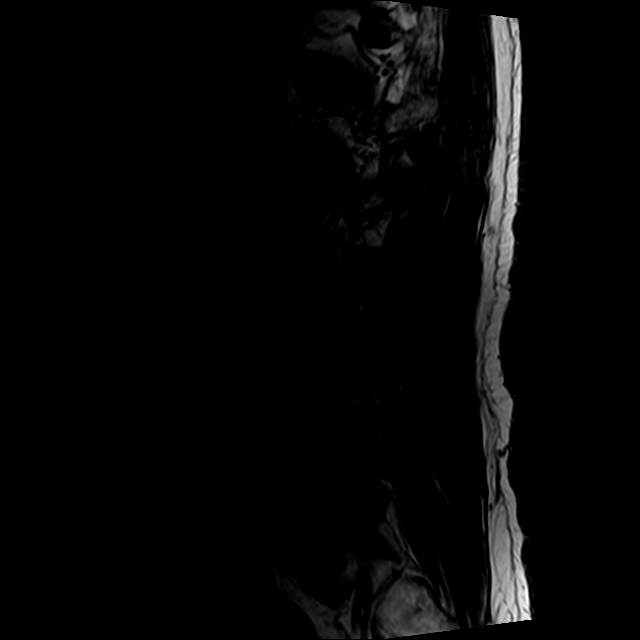
[im 15/15]
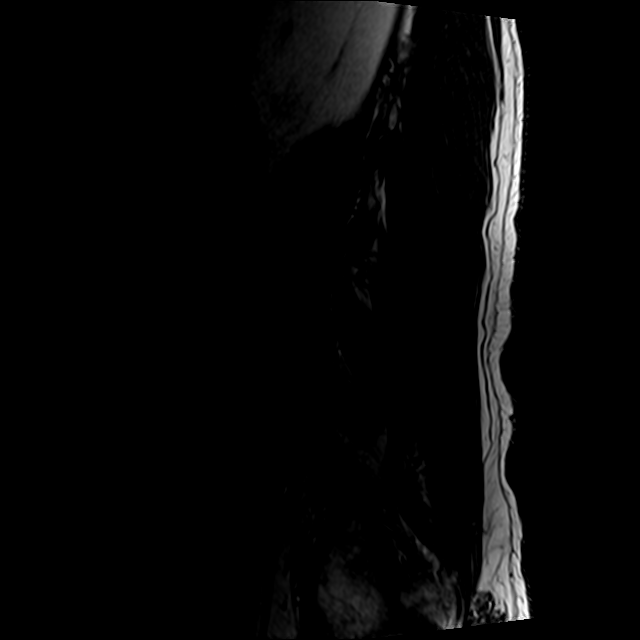

[Series 5: T2 · axial · 4.0mm · 0.78mm/px · z∈[-94,+133]mm · 9 of 40 slices shown (2 of 2)]
[im 1/40]
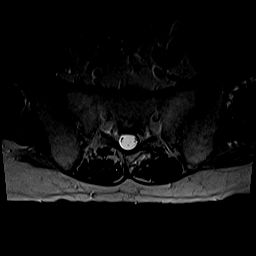
[im 6/40]
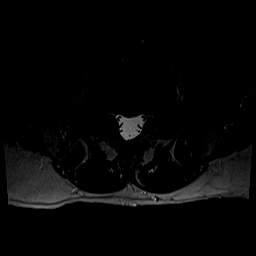
[im 12/40]
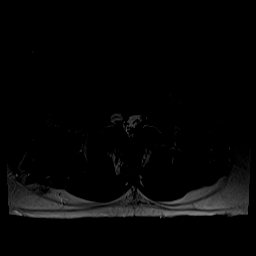
[im 17/40]
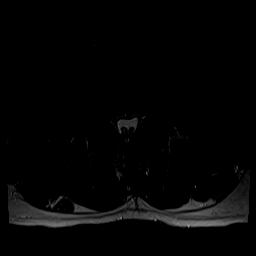
[im 20/40]
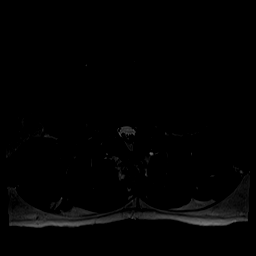
[im 23/40]
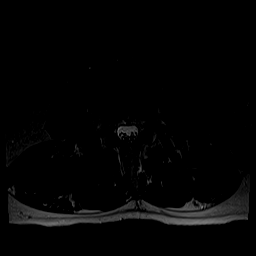
[im 28/40]
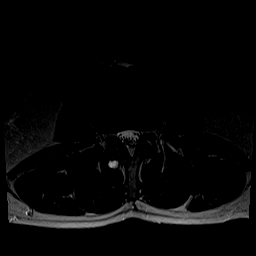
[im 34/40]
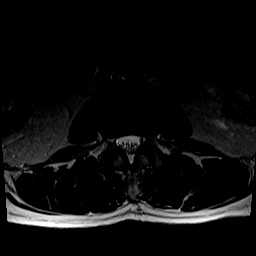
[im 40/40]
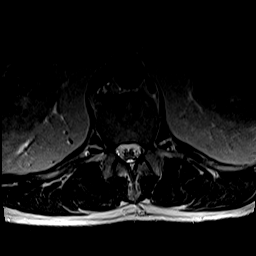

[Series 6: T1 · axial · 4.0mm · 0.39mm/px · z∈[-70,+103]mm · 3 of 40 slices shown (2 of 2)]
[im 6/40]
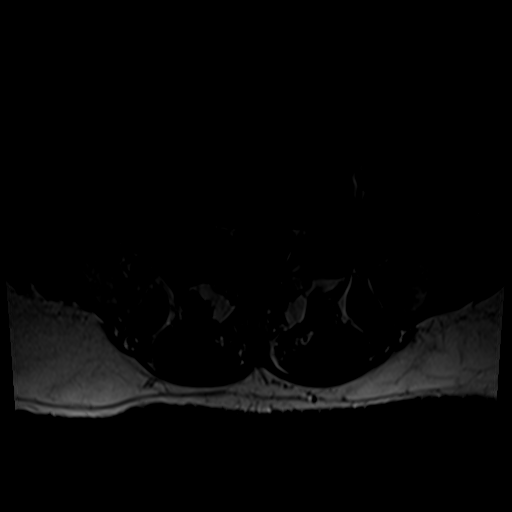
[im 20/40]
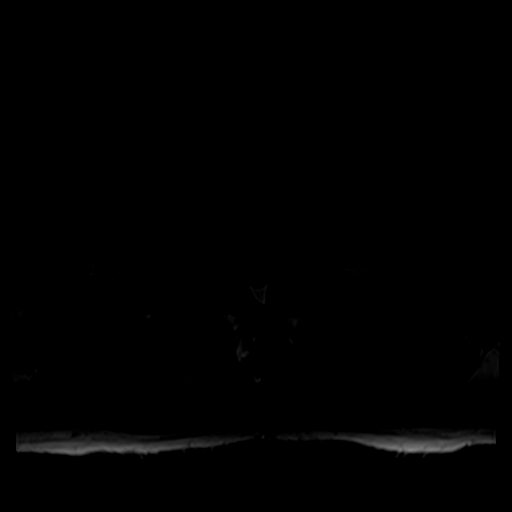
[im 34/40]
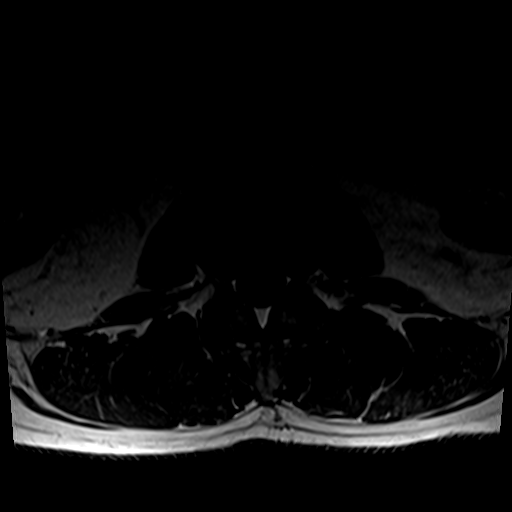

[24 of 48 positions shown; findings below may reference images not displayed]

FINDINGS: Segmentation: Normal thoracic segmentation on the comparisons.
Lumbar segmentation appears to be normal and will be designated as
such for this report.

Alignment: Grade 1 anterolisthesis of L4 on L5 measuring 4-5
millimeters. Subtle retrolisthesis of L3 on L4. Relatively preserved
lumbar lordosis.

Vertebrae: Bone marrow signal is mildly heterogeneous but overall
within normal limits. There are scattered benign vertebral body
hemangiomas, including in the T12 and L4 levels. No marrow edema or
evidence of acute osseous abnormality. Intact visible sacrum and SI
joints.

Conus medullaris and cauda equina: Conus extends to the L1 level.
Conus and cauda equina appear normal.

Paraspinal and other soft tissues: Negative.

Disc levels:

T12-L1:  Negative.

L1-L2:  Negative.

L2-L3: Mild circumferential disc bulge. Mild to moderate facet
hypertrophy. Posteriorly situated right side synovial cyst (series
5, image 13) which should not cause neural compromise. Mild endplate
spurring. Mild bilateral L2 foraminal stenosis.

L3-L4: Circumferential disc bulge. Broad-based posterior and
foraminal components of disc with endplate spurring. Mild to
moderate facet hypertrophy. Tiny bilateral posteriorly situated
synovial cysts and trace facet joint fluid. No significant spinal or
lateral recess stenosis. Mild to moderate left and mild right L3
foraminal stenosis.

L4-L5: Grade 1 anterolisthesis. Circumferential disc bulge with
broad-based posterior and foraminal components, greater on the left.
Severe facet degeneration with bilateral facet joint fluid and facet
hypertrophy. Mild ligament flavum hypertrophy.

There are multiple facet associated degenerative synovial cysts at
this level. The largest is a 15-16 millimeter diameter cyst
projecting into the right lateral recess as seen on series 2, images
6 and 7 and series 5, image 29 contributing to severe right lateral
recess stenosis at the descending right L5 nerve level. There are
several posteriorly situated synovial cysts, but there are also
small synovial cyst projecting into the bilateral L4 neural foramina
(left series 2, image 14 and right series 2, image 2). These
contribute to moderate to severe left greater than right L4 neural
foraminal stenosis.

L5-S1: Mild to moderate facet hypertrophy greater on the right. No
stenosis.
IMPRESSION: 1. Symptomatic level appears to be L4-L5 where grade 1
anterolisthesis is associated with severe facet arthropathy.
Multiple facet related degenerative synovial cysts have developed,
including a large 16 mm cyst projecting into the right lateral
recess contributing to severe stenosis at the level of the right L5
nerve. Multiple smaller synovial cysts project into both neural
foramina, contributing to severe left and moderate to severe right
L4 neural foraminal stenosis.
2. Lumbar disc, endplate, and facet degeneration at L2-L3 and L3-L4.
Mild neural foraminal stenosis except for up to moderate left L3
neural foraminal stenosis.

## 2019-09-01 DIAGNOSIS — R8271 Bacteriuria: Secondary | ICD-10-CM | POA: Diagnosis not present

## 2019-09-01 DIAGNOSIS — C774 Secondary and unspecified malignant neoplasm of inguinal and lower limb lymph nodes: Secondary | ICD-10-CM | POA: Diagnosis not present

## 2019-09-13 DIAGNOSIS — D485 Neoplasm of uncertain behavior of skin: Secondary | ICD-10-CM | POA: Diagnosis not present

## 2019-09-19 DIAGNOSIS — L439 Lichen planus, unspecified: Secondary | ICD-10-CM | POA: Diagnosis not present

## 2019-09-19 DIAGNOSIS — C609 Malignant neoplasm of penis, unspecified: Secondary | ICD-10-CM | POA: Insufficient documentation

## 2019-09-21 ENCOUNTER — Other Ambulatory Visit: Payer: Self-pay

## 2019-09-21 ENCOUNTER — Ambulatory Visit: Payer: Medicare Other | Admitting: Urology

## 2019-09-21 ENCOUNTER — Telehealth: Payer: Self-pay | Admitting: Family Medicine

## 2019-09-21 DIAGNOSIS — Z20822 Contact with and (suspected) exposure to covid-19: Secondary | ICD-10-CM | POA: Diagnosis not present

## 2019-09-21 DIAGNOSIS — Z01812 Encounter for preprocedural laboratory examination: Secondary | ICD-10-CM | POA: Diagnosis not present

## 2019-09-21 MED ORDER — FLUOXETINE HCL 20 MG PO CAPS: 20.0000 mg | ORAL_CAPSULE | Freq: Every day | ORAL | 0 refills | Status: DC

## 2019-09-21 NOTE — Telephone Encounter (Signed)
Patient last seen 05/09/19

## 2019-09-21 NOTE — Chronic Care Management (AMB) (Signed)
  Chronic Care Management   Outreach Note  09/21/2019 Name: Jack Estrada MRN: [id number] DOB: 19-Jul-1946  Referred by: Guadalupe Maple, MD Reason for referral : Chronic Care Management (Initial CCM outreach was unsuccessful. )   An unsuccessful telephone outreach was attempted today. The patient was referred to the case management team by for assistance with care management and care coordination.   Follow Up Plan: The care management team will reach out to the patient again over the next 7 days.    [place], [place] [address] Direct Dial: [email or web address]  Website: [email or web address]

## 2019-09-22 ENCOUNTER — Encounter: Payer: Self-pay | Admitting: Urology

## 2019-09-22 ENCOUNTER — Telehealth: Payer: Self-pay | Admitting: Family Medicine

## 2019-09-22 DIAGNOSIS — E119 Type 2 diabetes mellitus without complications: Secondary | ICD-10-CM

## 2019-09-22 NOTE — Telephone Encounter (Addendum)
Pt is calling and need a refill on humalog kwikpen. Pt has been out for about 3 days. [facility] in [place]. Pt has an appt with rachel on 10-27-2019

## 2019-09-22 NOTE — Telephone Encounter (Signed)
Routing to provider  

## 2019-09-23 DIAGNOSIS — E119 Type 2 diabetes mellitus without complications: Secondary | ICD-10-CM | POA: Diagnosis not present

## 2019-09-23 DIAGNOSIS — I10 Essential (primary) hypertension: Secondary | ICD-10-CM | POA: Diagnosis not present

## 2019-09-23 MED ORDER — INSULIN LISPRO (1 UNIT DIAL) 100 UNIT/ML (KWIKPEN): 20.0000 [IU] | PEN_INJECTOR | Freq: Three times a day (TID) | SUBCUTANEOUS | 11 refills | Status: DC

## 2019-09-23 NOTE — Telephone Encounter (Signed)
Rx sent 

## 2019-09-27 DIAGNOSIS — L578 Other skin changes due to chronic exposure to nonionizing radiation: Secondary | ICD-10-CM | POA: Diagnosis not present

## 2019-09-27 DIAGNOSIS — Z85828 Personal history of other malignant neoplasm of skin: Secondary | ICD-10-CM | POA: Diagnosis not present

## 2019-09-28 NOTE — Chronic Care Management (AMB) (Signed)
  Chronic Care Management   Outreach Note  09/28/2019 Name: CAMAR EVERINGHAM MRN: [id number] DOB: November 23, 1945  Referred by: Guadalupe Maple, MD Reason for referral : Chronic Care Management (Initial CCM outreach was unsuccessful. ) and Chronic Care Management (Second CCM outreach was unsuccessful. )   A second unsuccessful telephone outreach was attempted today. The patient was referred to the case management team for assistance with care management and care coordination.   Follow Up Plan: The care management team will reach out to the patient again over the next 7 days.   [place], [place] [address] Direct Dial: [email or web address]  Website: [email or web address]

## 2019-10-06 NOTE — Chronic Care Management (AMB) (Signed)
°  Chronic Care Management   Outreach Note  10/06/2019 Name: Jack Estrada MRN: [id number] DOB: November 07, 1945  Referred by: Guadalupe Maple, MD Reason for referral : Chronic Care Management (Initial CCM outreach was unsuccessful. ), Chronic Care Management (Second CCM outreach was unsuccessful. ), and Chronic Care Management (Third CCM outreach was unsuccessful )   Third unsuccessful telephone outreach was attempted today. The patient was referred to the case management team for assistance with care management and care coordination. The patient's primary care provider has been notified of our unsuccessful attempts to make or maintain contact with the patient. The care management team is pleased to engage with this patient at any time in the future should he/she be interested in assistance from the care management team.   Follow Up Plan: The care management team is available to follow up with the patient after provider conversation with the patient regarding recommendation for care management engagement and subsequent re-referral to the care management team.   [place], [place] [facility]  [place], [place] [address] Direct Dial: [email or web address]  Website: [email or web address]

## 2019-10-15 DIAGNOSIS — Z20822 Contact with and (suspected) exposure to covid-19: Secondary | ICD-10-CM | POA: Diagnosis not present

## 2019-10-15 DIAGNOSIS — Z01812 Encounter for preprocedural laboratory examination: Secondary | ICD-10-CM | POA: Diagnosis not present

## 2019-10-19 DIAGNOSIS — I10 Essential (primary) hypertension: Secondary | ICD-10-CM | POA: Diagnosis not present

## 2019-10-19 DIAGNOSIS — E119 Type 2 diabetes mellitus without complications: Secondary | ICD-10-CM | POA: Diagnosis not present

## 2019-10-21 DIAGNOSIS — J310 Chronic rhinitis: Secondary | ICD-10-CM | POA: Diagnosis not present

## 2019-10-21 DIAGNOSIS — J3489 Other specified disorders of nose and nasal sinuses: Secondary | ICD-10-CM | POA: Diagnosis not present

## 2019-10-27 ENCOUNTER — Ambulatory Visit: Payer: Medicare Other | Admitting: Family Medicine

## 2019-10-29 ENCOUNTER — Other Ambulatory Visit: Payer: Self-pay | Admitting: Nurse Practitioner

## 2019-10-29 NOTE — Telephone Encounter (Signed)
30 day courtesy refill- no further refills until pt has OV Requested Prescriptions  Pending Prescriptions Disp Refills  . FLUoxetine (PROZAC) 20 MG capsule [Pharmacy Med Name: FLUOXETINE 20MG  CAPSULES] 90 capsule 0    Sig: TAKE 1 CAPSULE(20 MG) BY MOUTH DAILY. FOR FURTHER REFILLS WILL NEED FOLLOW-UP     Psychiatry:  Antidepressants - SSRI Failed - 10/29/2019  1:19 PM      Failed - Completed PHQ-2 or PHQ-9 in the last 360 days.      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

## 2019-10-31 ENCOUNTER — Ambulatory Visit: Payer: Medicare Other

## 2019-10-31 ENCOUNTER — Other Ambulatory Visit: Payer: Self-pay

## 2019-10-31 MED ORDER — ARIPIPRAZOLE 5 MG PO TABS: 5.0000 mg | ORAL_TABLET | Freq: Every day | ORAL | 3 refills | Status: DC

## 2019-10-31 NOTE — Telephone Encounter (Signed)
Refill request for Abilify LOV: 06/02/2019 Next Appt: none

## 2019-11-02 ENCOUNTER — Ambulatory Visit: Payer: Medicare Other

## 2019-11-30 DIAGNOSIS — R52 Pain, unspecified: Secondary | ICD-10-CM | POA: Diagnosis not present

## 2019-11-30 DIAGNOSIS — Z09 Encounter for follow-up examination after completed treatment for conditions other than malignant neoplasm: Secondary | ICD-10-CM | POA: Diagnosis not present

## 2020-01-05 ENCOUNTER — Encounter: Payer: Self-pay | Admitting: Family Medicine

## 2020-01-05 ENCOUNTER — Ambulatory Visit (INDEPENDENT_AMBULATORY_CARE_PROVIDER_SITE_OTHER): Payer: Medicare Other | Admitting: Family Medicine

## 2020-01-05 ENCOUNTER — Other Ambulatory Visit: Payer: Self-pay

## 2020-01-05 VITALS — BP 133/89 | HR 61 | Temp 97.7°F | Wt 221.0 lb

## 2020-01-05 DIAGNOSIS — E785 Hyperlipidemia, unspecified: Secondary | ICD-10-CM

## 2020-01-05 DIAGNOSIS — I10 Essential (primary) hypertension: Secondary | ICD-10-CM

## 2020-01-05 DIAGNOSIS — E113293 Type 2 diabetes mellitus with mild nonproliferative diabetic retinopathy without macular edema, bilateral: Secondary | ICD-10-CM

## 2020-01-05 DIAGNOSIS — F419 Anxiety disorder, unspecified: Secondary | ICD-10-CM

## 2020-01-05 DIAGNOSIS — F339 Major depressive disorder, recurrent, unspecified: Secondary | ICD-10-CM | POA: Diagnosis not present

## 2020-01-05 DIAGNOSIS — Z794 Long term (current) use of insulin: Secondary | ICD-10-CM | POA: Diagnosis not present

## 2020-01-05 DIAGNOSIS — R1032 Left lower quadrant pain: Secondary | ICD-10-CM

## 2020-01-05 DIAGNOSIS — R1031 Right lower quadrant pain: Secondary | ICD-10-CM

## 2020-01-05 MED ORDER — SILDENAFIL CITRATE 50 MG PO TABS: 50.0000 mg | ORAL_TABLET | Freq: Every day | ORAL | 11 refills | Status: DC | PRN

## 2020-01-05 MED ORDER — ARIPIPRAZOLE 5 MG PO TABS: 5.0000 mg | ORAL_TABLET | Freq: Every day | ORAL | 1 refills | Status: DC

## 2020-01-05 MED ORDER — LORAZEPAM 1 MG PO TABS: 1.0000 mg | ORAL_TABLET | Freq: Every day | ORAL | 0 refills | Status: DC | PRN

## 2020-01-05 MED ORDER — SOLIQUA 100-33 UNT-MCG/ML ~~LOC~~ SOPN: 30.0000 [IU] | PEN_INJECTOR | Freq: Every day | SUBCUTANEOUS | 1 refills | Status: DC

## 2020-01-05 MED ORDER — CYCLOBENZAPRINE HCL 10 MG PO TABS: 5.0000 mg | ORAL_TABLET | Freq: Three times a day (TID) | ORAL | 0 refills | Status: DC | PRN

## 2020-01-05 MED ORDER — FLUOXETINE HCL 40 MG PO CAPS: 40.0000 mg | ORAL_CAPSULE | Freq: Every day | ORAL | 1 refills | Status: DC

## 2020-01-05 MED ORDER — ATORVASTATIN CALCIUM 40 MG PO TABS: 40.0000 mg | ORAL_TABLET | Freq: Every day | ORAL | 1 refills | Status: DC

## 2020-01-05 MED ORDER — AMLODIPINE BESYLATE 5 MG PO TABS: 5.0000 mg | ORAL_TABLET | Freq: Every day | ORAL | 1 refills | Status: DC
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
## 2020-01-06 LAB — COMPREHENSIVE METABOLIC PANEL
ALT: 20 IU/L (ref 0–44)
AST: 17 IU/L (ref 0–40)
Albumin/Globulin Ratio: 1.3 (ref 1.2–2.2)
Albumin: 4 g/dL (ref 3.7–4.7)
Alkaline Phosphatase: 105 IU/L (ref 39–117)
BUN/Creatinine Ratio: 11 (ref 10–24)
BUN: 13 mg/dL (ref 8–27)
Bilirubin Total: 0.4 mg/dL (ref 0.0–1.2)
CO2: 29 mmol/L (ref 20–29)
Calcium: 9.7 mg/dL (ref 8.6–10.2)
Chloride: 102 mmol/L (ref 96–106)
Creatinine, Ser: 1.18 mg/dL (ref 0.76–1.27)
GFR calc Af Amer: 71 mL/min/{1.73_m2} (ref >59)
GFR calc non Af Amer: 62 mL/min/{1.73_m2} (ref >59)
Globulin, Total: 3.1 g/dL (ref 1.5–4.5)
Glucose: 252 mg/dL — ABNORMAL HIGH (ref 65–99)
Potassium: 4.5 mmol/L (ref 3.5–5.2)
Sodium: 140 mmol/L (ref 134–144)
Total Protein: 7.1 g/dL (ref 6.0–8.5)

## 2020-01-06 LAB — LIPID PANEL W/O CHOL/HDL RATIO
Cholesterol, Total: 194 mg/dL (ref 100–199)
HDL: 39 mg/dL — ABNORMAL LOW (ref >39)
LDL Chol Calc (NIH): 106 mg/dL — ABNORMAL HIGH (ref 0–99)
Triglycerides: 288 mg/dL — ABNORMAL HIGH (ref 0–149)
VLDL Cholesterol Cal: 49 mg/dL — ABNORMAL HIGH (ref 5–40)

## 2020-01-06 LAB — HEMOGLOBIN A1C
Est. average glucose Bld gHb Est-mCnc: 217 mg/dL
Hgb A1c MFr Bld: 9.2 % — ABNORMAL HIGH (ref 4.8–5.6)

## 2020-01-11 NOTE — Assessment & Plan Note (Addendum)
Exacerbated by stressors, but feels medications helpful. WIll increase prozac and abilify dose and continue to monitor closely

## 2020-01-11 NOTE — Assessment & Plan Note (Signed)
Recheck lipids, continue current regimen, work on lifestyle modifications

## 2020-01-11 NOTE — Assessment & Plan Note (Signed)
BPs stable and WNL, continue current regimen 

## 2020-01-11 NOTE — Assessment & Plan Note (Signed)
Overall stable relative to stressors. Continue current regimen with rare prn use of ativan

## 2020-01-11 NOTE — Assessment & Plan Note (Signed)
With labile BSs and occasional hypoglycemia on current regimen, will try transitioning to [place] starting at 30 units and adjusting based on response. D/c lantus and humalog for now, will likely need to re-add humalog if not under good control consistently.

## 2020-01-18 DIAGNOSIS — L720 Epidermal cyst: Secondary | ICD-10-CM | POA: Diagnosis not present

## 2020-01-18 DIAGNOSIS — C44612 Basal cell carcinoma of skin of right upper limb, including shoulder: Secondary | ICD-10-CM | POA: Diagnosis not present

## 2020-01-18 DIAGNOSIS — D485 Neoplasm of uncertain behavior of skin: Secondary | ICD-10-CM | POA: Diagnosis not present

## 2020-02-02 ENCOUNTER — Ambulatory Visit: Payer: Medicare Other | Admitting: Family Medicine

## 2020-02-02 ENCOUNTER — Ambulatory Visit: Payer: Medicare Other

## 2020-02-08 ENCOUNTER — Ambulatory Visit (INDEPENDENT_AMBULATORY_CARE_PROVIDER_SITE_OTHER): Payer: Medicare Other

## 2020-02-08 VITALS — Ht 72.0 in | Wt 220.0 lb

## 2020-02-08 DIAGNOSIS — Z0000 Encounter for general adult medical examination without abnormal findings: Secondary | ICD-10-CM

## 2020-02-08 NOTE — Patient Instructions (Signed)
[name] , Thank you for taking time to come for your Medicare Wellness Visit. I appreciate your ongoing commitment to your health goals. Please review the following plan we discussed and let me know if I can assist you in the future.   Screening recommendations/referrals: Colonoscopy: declined  Recommended yearly ophthalmology/optometry visit for glaucoma screening and checkup Recommended yearly dental visit for hygiene and checkup  Vaccinations: Influenza vaccine: declined  Pneumococcal vaccine: booster eligible  Tdap vaccine: up to date  Shingles vaccine: shingrix eligible    Covid-19: We are recommending the vaccine to everyone who has not had an allergic reaction to any of the components of the vaccine. If you have specific questions about the vaccine, please bring them up with your health care provider to discuss them.   We will likely not be getting the vaccine in the office for the first rounds of vaccinations. The way they are releasing the vaccines is going to be through the health systems (like [place], [place], [facility], [facility]), through your county health department, or through the pharmacies.   The [facility] Department is giving vaccines to those 65+ and Health Care Workers Teachers and [place] providers start 11/09/19, Essential workers start 3/10 and those with co-morbidities start 12/07/19 Call [phone] to schedule  If you are 65+ you can get a vaccine through [facility] by signing up for an appointment.  You can sign up by going to: [email or web address].  You can get more information by going to: [email or web address]  [employer] next door is giving the [employer]- you can call [phone] or stop by there to schedule.  Advanced directives: Please bring a copy of your health care power of attorney and living will to the office at your convenience.  Conditions/risks identified: diabetic, discussed chronic care management team    Next appointment: Follow up in one year for your annual wellness visit 02/13/2021 at 1030am  Preventive Care 65 Years and Older, Male Preventive care refers to lifestyle choices and visits with your health care provider that can promote health and wellness. What does preventive care include?  A yearly physical exam. This is also called an annual well check.  Dental exams once or twice a year.  Routine eye exams. Ask your health care provider how often you should have your eyes checked.  Personal lifestyle choices, including:  Daily care of your teeth and gums.  Regular physical activity.  Eating a healthy diet.  Avoiding tobacco and drug use.  Limiting alcohol use.  Practicing safe sex.  Taking low doses of aspirin every day.  Taking vitamin and mineral supplements as recommended by your health care provider. What happens during an annual well check? The services and screenings done by your health care provider during your annual well check will depend on your age, overall health, lifestyle risk factors, and family history of disease. Counseling  Your health care provider may ask you questions about your:  Alcohol use.  Tobacco use.  Drug use.  Emotional well-being.  Home and relationship well-being.  Sexual activity.  Eating habits.  History of falls.  Memory and ability to understand (cognition).  Work and work Statistician. Screening  You may have the following tests or measurements:  Height, weight, and BMI.  Blood pressure.  Lipid and cholesterol levels. These may be checked every 5 years, or more frequently if you are over 53 years old.  Skin check.  Lung cancer screening. You may have this screening every year starting at  age 18 if you have a 30-pack-year history of smoking and currently smoke or have quit within the past 15 years.  Fecal occult blood test (FOBT) of the stool. You may have this test every year starting at age 80.  Flexible  sigmoidoscopy or colonoscopy. You may have a sigmoidoscopy every 5 years or a colonoscopy every 10 years starting at age 46.  Prostate cancer screening. Recommendations will vary depending on your family history and other risks.  Hepatitis C blood test.  Hepatitis B blood test.  Sexually transmitted disease (STD) testing.  Diabetes screening. This is done by checking your blood sugar (glucose) after you have not eaten for a while (fasting). You may have this done every 1-3 years.  Abdominal aortic aneurysm (AAA) screening. You may need this if you are a current or former smoker.  Osteoporosis. You may be screened starting at age 51 if you are at high risk. Talk with your health care provider about your test results, treatment options, and if necessary, the need for more tests. Vaccines  Your health care provider may recommend certain vaccines, such as:  Influenza vaccine. This is recommended every year.  Tetanus, diphtheria, and acellular pertussis (Tdap, Td) vaccine. You may need a Td booster every 10 years.  Zoster vaccine. You may need this after age 48.  Pneumococcal 13-valent conjugate (PCV13) vaccine. One dose is recommended after age 41.  Pneumococcal polysaccharide (PPSV23) vaccine. One dose is recommended after age 69. Talk to your health care provider about which screenings and vaccines you need and how often you need them. This information is not intended to replace advice given to you by your health care provider. Make sure you discuss any questions you have with your health care provider. Document Released: 09/28/2015 Document Revised: 05/21/2016 Document Reviewed: 07/03/2015 Elsevier Interactive Patient Education  2017 [place] Prevention in the Home Falls can cause injuries. They can happen to people of all ages. There are many things you can do to make your home safe and to help prevent falls. What can I do on the outside of my home?  Regularly fix the  edges of walkways and driveways and fix any cracks.  Remove anything that might make you trip as you walk through a door, such as a raised step or threshold.  Trim any bushes or trees on the path to your home.  Use bright outdoor lighting.  Clear any walking paths of anything that might make someone trip, such as rocks or tools.  Regularly check to see if handrails are loose or broken. Make sure that both sides of any steps have handrails.  Any raised decks and porches should have guardrails on the edges.  Have any leaves, snow, or ice cleared regularly.  Use sand or salt on walking paths during winter.  Clean up any spills in your garage right away. This includes oil or grease spills. What can I do in the bathroom?  Use night lights.  Install grab bars by the toilet and in the tub and shower. Do not use towel bars as grab bars.  Use non-skid mats or decals in the tub or shower.  If you need to sit down in the shower, use a plastic, non-slip stool.  Keep the floor dry. Clean up any water that spills on the floor as soon as it happens.  Remove soap buildup in the tub or shower regularly.  Attach bath mats securely with double-sided non-slip rug tape.  Do not have  throw rugs and other things on the floor that can make you trip. What can I do in the bedroom?  Use night lights.  Make sure that you have a light by your bed that is easy to reach.  Do not use any sheets or blankets that are too big for your bed. They should not hang down onto the floor.  Have a firm chair that has side arms. You can use this for support while you get dressed.  Do not have throw rugs and other things on the floor that can make you trip. What can I do in the kitchen?  Clean up any spills right away.  Avoid walking on wet floors.  Keep items that you use a lot in easy-to-reach places.  If you need to reach something above you, use a strong step stool that has a grab bar.  Keep  electrical cords out of the way.  Do not use floor polish or wax that makes floors slippery. If you must use wax, use non-skid floor wax.  Do not have throw rugs and other things on the floor that can make you trip. What can I do with my stairs?  Do not leave any items on the stairs.  Make sure that there are handrails on both sides of the stairs and use them. Fix handrails that are broken or loose. Make sure that handrails are as long as the stairways.  Check any carpeting to make sure that it is firmly attached to the stairs. Fix any carpet that is loose or worn.  Avoid having throw rugs at the top or bottom of the stairs. If you do have throw rugs, attach them to the floor with carpet tape.  Make sure that you have a light switch at the top of the stairs and the bottom of the stairs. If you do not have them, ask someone to add them for you. What else can I do to help prevent falls?  Wear shoes that:  Do not have high heels.  Have rubber bottoms.  Are comfortable and fit you well.  Are closed at the toe. Do not wear sandals.  If you use a stepladder:  Make sure that it is fully opened. Do not climb a closed stepladder.  Make sure that both sides of the stepladder are locked into place.  Ask someone to hold it for you, if possible.  Clearly mark and make sure that you can see:  Any grab bars or handrails.  First and last steps.  Where the edge of each step is.  Use tools that help you move around (mobility aids) if they are needed. These include:  Canes.  Walkers.  Scooters.  Crutches.  Turn on the lights when you go into a dark area. Replace any light bulbs as soon as they burn out.  Set up your furniture so you have a clear path. Avoid moving your furniture around.  If any of your floors are uneven, fix them.  If there are any pets around you, be aware of where they are.  Review your medicines with your doctor. Some medicines can make you feel dizzy.  This can increase your chance of falling. Ask your doctor what other things that you can do to help prevent falls. This information is not intended to replace advice given to you by your health care provider. Make sure you discuss any questions you have with your health care provider. Document Released: 06/28/2009 Document Revised: 02/07/2016 Document Reviewed:  10/06/2014 Elsevier Interactive Patient Education  2017 [employer].

## 2020-02-08 NOTE — Progress Notes (Signed)
Subjective:   Jack Estrada is a 74 y.o. male who presents for Medicare Annual/Subsequent preventive examination.  I connected with Jack Estrada today by telephone and verified that I am speaking with the correct person using two identifiers. Location patient: home Location provider: work Persons participating in the virtual visit: patient, provider.   I discussed the limitations, risks, security and privacy concerns of performing an evaluation and management service by telephone and the availability of in person appointments. I also discussed with the patient that there may be a patient responsible charge related to this service. The patient expressed understanding and verbally consented to this telephonic visit.    Interactive audio and video telecommunications were attempted between this provider and patient, however failed, due to patient having technical difficulties OR patient did not have access to video capability.  We continued and completed visit with audio only.  Some vital signs may be absent or patient reported.   Time Spent with patient on telephone encounter: 20 minutes   Review of Systems:   Cardiac Risk Factors include: advanced age (>5men, >63 women);male gender;dyslipidemia;hypertension;diabetes mellitus     Objective:    Vitals: Ht 6' (1.829 m)   Wt 220 lb (99.8 kg)   BMI 29.84 kg/m   Body mass index is 29.84 kg/m.  Advanced Directives 02/08/2020  Does Patient Have a Medical Advance Directive? Yes  Type of Advance Directive Living will;Healthcare Power of Jack Estrada in Chart? No - copy requested    Tobacco Social History   Tobacco Use  Smoking Status Never Smoker  Smokeless Tobacco Never Used     Counseling given: Not Answered   Clinical Intake:  Pre-visit preparation completed: Yes  Pain : 0-10 Pain Score: 5  Pain Type: Chronic pain Pain Location: Hip Pain Orientation: Right Pain Descriptors /  Indicators: Aching Pain Onset: More than a month ago Pain Frequency: Constant Pain Relieving Factors: excedrin  Pain Relieving Factors: excedrin  Nutritional Status: BMI > 30  Obese Nutritional Risks: None Diabetes: Yes CBG done?: No Did pt. bring in CBG monitor from home?: No  How often do you need to have someone help you when you read instructions, pamphlets, or other written materials from your doctor or pharmacy?: 1 - Never  Interpreter Needed?: No  Information entered by :: Jack Gustafson,LPN  Past Medical History:  Diagnosis Date  . Chronic kidney disease   . Hyperlipidemia   . Hypertension   . Retinopathy due to secondary diabetes [facility])    Past Surgical History:  Procedure Laterality Date  . thumb surgery Left March 2016   Family History  Problem Relation Age of Onset  . Diabetes Mother   . Heart disease Father   . Stroke Father    Social History   Socioeconomic History  . Marital status: Married    Spouse name: Not on file  . Number of children: Not on file  . Years of education: Not on file  . Highest education level: Not on file  Occupational History  . Not on file  Tobacco Use  . Smoking status: Never Smoker  . Smokeless tobacco: Never Used  Substance and Sexual Activity  . Alcohol use: No  . Drug use: No  . Sexual activity: Not on file  Other Topics Concern  . Not on file  Social History Narrative  . Not on file   Social Determinants of Health   Financial Resource Strain:   . Difficulty of  Paying Living Expenses:   Food Insecurity:   . Worried About Charity fundraiser in the Last Year:   . Arboriculturist in the Last Year:   Transportation Needs:   . Film/video editor (Medical):   Marland Kitchen Lack of Transportation (Non-Medical):   Physical Activity:   . Days of Exercise per Week:   . Minutes of Exercise per Session:   Stress:   . Feeling of Stress :   Social Connections:   . Frequency of Communication with Friends and Family:   .  Frequency of Social Gatherings with Friends and Family:   . Attends Religious Services:   . Active Member of Clubs or Organizations:   . Attends Archivist Meetings:   Marland Kitchen Marital Status:     Outpatient Encounter Medications as of 02/08/2020  Medication Sig  . amLODipine (NORVASC) 5 MG tablet Take 1 tablet (5 mg total) by mouth daily.  . ARIPiprazole (ABILIFY) 5 MG tablet Take 1 tablet (5 mg total) by mouth daily.  Marland Kitchen atorvastatin (LIPITOR) 40 MG tablet Take 1 tablet (40 mg total) by mouth daily.  Marland Kitchen BAYER MICROLET LANCETS lancets Use to check sugar twice a day  . cyclobenzaprine (FLEXERIL) 10 MG tablet Take 0.5-1 tablets (5-10 mg total) by mouth 3 (three) times daily as needed for muscle spasms. DO NOT DRIVE WHILE TAKING MEDICATION  . FLUoxetine (PROZAC) 40 MG capsule Take 1 capsule (40 mg total) by mouth daily.  Marland Kitchen glucose blood (BAYER CONTOUR TEST) test strip Use to check sugar twice a day  . Insulin Glargine (LANTUS SOLOSTAR) 100 UNIT/ML Solostar Pen INJECT 50 UNITS UNDER THE SKIN EVERY MORNING  . insulin lispro (HUMALOG KWIKPEN) 100 UNIT/ML KwikPen Inject 0.2-0.3 mLs (20-30 Units total) into the skin 3 (three) times daily.  Marland Kitchen LORazepam (ATIVAN) 1 MG tablet Take 1 tablet (1 mg total) by mouth daily as needed for anxiety.  . sildenafil (VIAGRA) 50 MG tablet Take 1-2 tablets (50-100 mg total) by mouth daily as needed for erectile dysfunction.  . Insulin Glargine-Lixisenatide (SOLIQUA) 100-33 UNT-MCG/ML SOPN Inject 30 Units into the skin daily. (Patient not taking: Reported on 02/08/2020)   No facility-administered encounter medications on file as of 02/08/2020.    Activities of Daily Living In your present state of health, do you have any difficulty performing the following activities: 02/08/2020 01/05/2020  Hearing? N Y  Comment no hearing aids -  Vision? Jack Estrada  Comment retinal specialist in [place]. Jack Estrada, eyeglasses -  Difficulty concentrating or making decisions? N N    Walking or climbing stairs? N Y  Dressing or bathing? N N  Doing errands, shopping? N N  Preparing Food and eating ? N -  Using the Toilet? N -  In the past six months, have you accidently leaked urine? N -  Do you have problems with loss of bowel control? N -  Managing your Medications? N -  Managing your Finances? N -  Housekeeping or managing your Housekeeping? N -  Some recent data might be hidden    Patient Care Team: Guadalupe Maple, MD as PCP - General (Family Medicine) Christophe Louis, MD as Referring Physician (Specialist) Dionisio David, MD as Consulting Physician (Cardiology) Dagoberto Ligas, MD as Referring Physician (Internal Medicine)   Assessment:   This is a routine wellness examination for [employer].  Exercise Activities and Dietary recommendations Current Exercise Habits: Home exercise routine, Type of exercise: walking, Intensity: Mild, Exercise limited by: orthopedic condition(s)(hip)  Goals Addressed   None     Fall Risk: Fall Risk  02/08/2020 01/05/2020 07/19/2018 04/28/2017 02/18/2016  Falls in the past year? 0 0 0 No Yes  Number falls in past yr: 0 0 - - 1  Injury with Fall? 0 0 0 - No  Risk for fall due to : Impaired balance/gait - - - -  Follow up - - Falls evaluation completed - -    FALL RISK PREVENTION PERTAINING TO THE HOME:  Any stairs in or around the home? No  If so, are there any without handrails? No   Home free of loose throw rugs in walkways, pet beds, electrical cords, etc? Yes  Adequate lighting in your home to reduce risk of falls? Yes   ASSISTIVE DEVICES UTILIZED TO PREVENT FALLS:  Life alert? No  Use of a cane, walker or w/c? No  Grab bars in the bathroom? No  Shower chair or bench in shower? No  Elevated toilet seat or a handicapped toilet? No   TIMED UP AND GO:  Unable to perform   Depression Screen PHQ 2/9 Scores 02/08/2020 01/05/2020 04/28/2017 02/18/2016  PHQ - 2 Score 1 1 0 2  PHQ- 9 Score - 3 - -    Cognitive  Function        Immunization History  Administered Date(s) Administered  . Influenza,inj,Quad PF,6+ Mos 07/09/2015  . Pneumococcal Polysaccharide-23 07/03/2008  . Tdap 09/30/2012    Qualifies for Shingles Vaccine? Yes  Zostavax completed n/a. Due for Shingrix. Education has been provided regarding the importance of this vaccine. Pt has been advised to call insurance company to determine out of pocket expense. Advised may also receive vaccine at local pharmacy or Health Dept. Verbalized acceptance and understanding.  Tdap: up to date   Flu Vaccine: declined   Pneumococcal Vaccine: due now, declined   Covid-19 Vaccine: Information provided   Screening Tests Health Maintenance  Topic Date Due  . COVID-19 Vaccine (1) Never done  . COLONOSCOPY  Never done  . PNA vac Low Risk Adult (1 of 2 - PCV13) 01/15/2013  . OPHTHALMOLOGY EXAM  10/22/2018  . FOOT EXAM  02/23/2019  . URINE MICROALBUMIN  02/23/2019  . HEMOGLOBIN A1C  04/05/2020  . INFLUENZA VACCINE  04/15/2020  . TETANUS/TDAP  09/30/2022  . Hepatitis C Screening  Completed   Cancer Screenings:  Colorectal Screening: declined   Lung Cancer Screening: (Low Dose CT Chest recommended if Age 28-80 years, 30 pack-year currently smoking OR have quit w/in 15years.) does not qualify.    Additional Screening:  Hepatitis C Screening: does qualify; Completed 2019  Vision Screening: Recommended annual ophthalmology exams for early detection of glaucoma and other disorders of the eye. Is the patient up to date with their annual eye exam?  Yes  Who is the provider or what is the name of the office in which the pt attends annual eye exams? Jack Estrada    Dental Screening: Recommended annual dental exams for proper oral hygiene  Community Resource Referral:  CRR required this visit?  No        Plan:  I have personally reviewed and addressed the Medicare Annual Wellness questionnaire and have noted the following in the patient's  chart:  A. Medical and social history B. Use of alcohol, tobacco or illicit drugs  C. Current medications and supplements D. Functional ability and status E.  Nutritional status F.  Physical activity G. Advance directives H. List of other physicians I.  Hospitalizations, surgeries,  and ER visits in previous 12 months J.  [place] such as hearing and vision if needed, cognitive and depression L. Referrals and appointments   In addition, I have reviewed and discussed with patient certain preventive protocols, quality metrics, and best practice recommendations. A written personalized care plan for preventive services as well as general preventive health recommendations were provided to patient.   Signed,   Bevelyn Ngo, LPN  [id number] Nurse Health Advisor   Nurse Notes: none

## 2020-02-10 ENCOUNTER — Telehealth: Payer: Self-pay | Admitting: Family Medicine

## 2020-02-10 ENCOUNTER — Ambulatory Visit (INDEPENDENT_AMBULATORY_CARE_PROVIDER_SITE_OTHER): Payer: Medicare Other | Admitting: Family Medicine

## 2020-02-10 ENCOUNTER — Other Ambulatory Visit: Payer: Self-pay

## 2020-02-10 ENCOUNTER — Encounter: Payer: Self-pay | Admitting: Family Medicine

## 2020-02-10 VITALS — BP 156/92 | HR 54 | Temp 97.8°F | Wt 216.0 lb

## 2020-02-10 DIAGNOSIS — E113293 Type 2 diabetes mellitus with mild nonproliferative diabetic retinopathy without macular edema, bilateral: Secondary | ICD-10-CM

## 2020-02-10 DIAGNOSIS — Z1211 Encounter for screening for malignant neoplasm of colon: Secondary | ICD-10-CM

## 2020-02-10 DIAGNOSIS — E1165 Type 2 diabetes mellitus with hyperglycemia: Secondary | ICD-10-CM | POA: Diagnosis not present

## 2020-02-10 DIAGNOSIS — F419 Anxiety disorder, unspecified: Secondary | ICD-10-CM | POA: Diagnosis not present

## 2020-02-10 DIAGNOSIS — F339 Major depressive disorder, recurrent, unspecified: Secondary | ICD-10-CM | POA: Diagnosis not present

## 2020-02-10 DIAGNOSIS — I10 Essential (primary) hypertension: Secondary | ICD-10-CM

## 2020-02-10 DIAGNOSIS — Z794 Long term (current) use of insulin: Secondary | ICD-10-CM

## 2020-02-10 NOTE — Assessment & Plan Note (Signed)
Has not been on amlodipine for over a month because he did not receive from pharmacy. Has script available and will restart as soon as possible. Home BP checks recommended, call if not normalizing

## 2020-02-10 NOTE — Assessment & Plan Note (Signed)
Improved despite stressors, continue current regimen

## 2020-02-10 NOTE — Assessment & Plan Note (Signed)
Improved on increased dosing, conitnue current regimen

## 2020-02-10 NOTE — Chronic Care Management (AMB) (Signed)
  Chronic Care Management   Outreach Note  02/10/2020 Name: Jack Estrada MRN: [id number] DOB: [birth year]  Jack Estrada is a 74 y.o. year old male who is a primary care patient of Crissman, Jeannette How, MD. I reached out to Jack Estrada by phone today in response to a referral sent by Mr. [place] PCP, Merrie Roof PA-C     An unsuccessful telephone outreach was attempted today. The patient was referred to the case management team for assistance with care management and care coordination.   Follow Up Plan: The care management team will reach out to the patient again over the next 7 days.   Glenna Durand, LPN Health Advisor, Embedded Care Coordination [place] [facility] ??[email or web address] ??[phone]
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
## 2020-02-10 NOTE — Assessment & Plan Note (Signed)
Inconsistent BS control, BS checking, and dietary intake. Does not want to switch to soliqua as he has so much of the lantus and humalog still left. Will refer to Endocrinology for consult and consideration of continuous glucose monitoring system. Continue current regimen with close BS checks in meantime

## 2020-02-17 NOTE — Chronic Care Management (AMB) (Signed)
°  Chronic Care Management   Outreach Note  02/17/2020 Name: Jack Estrada MRN: [id number] DOB: 02-27-1946  [name] is a 74 y.o. year old male who is a primary care patient of Crissman, Jeannette How, MD. I reached out to [name] by phone today in response to a referral sent by Mr. Bertram Gala Haupt's PCP, Merrie Roof PA-C     A second unsuccessful telephone outreach was attempted today. The patient was referred to the case management team for assistance with care management and care coordination.   Follow Up Plan: The care management team will reach out to the patient again over the next 7 days.   Glenna Durand, LPN Health Advisor, Embedded Care Coordination [place]  Care Management ??[email or web address]  ??[phone]

## 2020-02-20 ENCOUNTER — Telehealth (INDEPENDENT_AMBULATORY_CARE_PROVIDER_SITE_OTHER): Payer: Self-pay | Admitting: Gastroenterology

## 2020-02-20 DIAGNOSIS — Z1211 Encounter for screening for malignant neoplasm of colon: Secondary | ICD-10-CM

## 2020-02-20 NOTE — Progress Notes (Signed)
Gastroenterology Pre-Procedure Review  Request Date: PENDING PT CALL BACK Requesting Physician: TBD  PATIENT REVIEW QUESTIONS: The patient responded to the following health history questions as indicated:    1. Are you having any GI issues? no 2. Do you have a personal history of Polyps? no 3. Do you have a family history of Colon Cancer or Polyps? no 4. Diabetes Mellitus? yes (type 2 takes insulin) 5. Joint replacements in the past 12 months?no 6. Major health problems in the past 3 months?no 7. Any artificial heart valves, MVP, or defibrillator?no    MEDICATIONS & ALLERGIES:    Patient reports the following regarding taking any anticoagulation/antiplatelet therapy:   Plavix, Coumadin, Eliquis, Xarelto, Lovenox, Pradaxa, Brilinta, or Effient? no Aspirin? no  Patient confirms/reports the following medications:  Current Outpatient Medications  Medication Sig Dispense Refill  . amLODipine (NORVASC) 5 MG tablet Take 1 tablet (5 mg total) by mouth daily. 90 tablet 1  . ARIPiprazole (ABILIFY) 5 MG tablet Take 1 tablet (5 mg total) by mouth daily. 90 tablet 1  . atorvastatin (LIPITOR) 40 MG tablet Take 1 tablet (40 mg total) by mouth daily. 90 tablet 1  . BAYER MICROLET LANCETS lancets Use to check sugar twice a day 100 each 2  . cyclobenzaprine (FLEXERIL) 10 MG tablet Take 0.5-1 tablets (5-10 mg total) by mouth 3 (three) times daily as needed for muscle spasms. DO NOT DRIVE WHILE TAKING MEDICATION 30 tablet 0  . glucose blood (BAYER CONTOUR TEST) test strip Use to check sugar twice a day 100 each 2  . Insulin Glargine (LANTUS SOLOSTAR) 100 UNIT/ML Solostar Pen INJECT 50 UNITS UNDER THE SKIN EVERY MORNING 15 mL 10  . insulin lispro (HUMALOG KWIKPEN) 100 UNIT/ML KwikPen Inject 0.2-0.3 mLs (20-30 Units total) into the skin 3 (three) times daily. 15 mL 11  . LORazepam (ATIVAN) 1 MG tablet Take 1 tablet (1 mg total) by mouth daily as needed for anxiety. 30 tablet 0  . sildenafil (VIAGRA) 50 MG  tablet Take 1-2 tablets (50-100 mg total) by mouth daily as needed for erectile dysfunction. 30 tablet 11  . FLUoxetine (PROZAC) 40 MG capsule Take 1 capsule (40 mg total) by mouth daily. (Patient not taking: Reported on 02/20/2020) 90 capsule 1  . Insulin Glargine-Lixisenatide (SOLIQUA) 100-33 UNT-MCG/ML SOPN Inject 30 Units into the skin daily. (Patient not taking: Reported on 02/08/2020) 15 mL 1   No current facility-administered medications for this visit.    Patient confirms/reports the following allergies:  Allergies  Allergen Reactions  . Benazepril Cough    No orders of the defined types were placed in this encounter.   AUTHORIZATION INFORMATION Primary Insurance: 1D#: Group #:  Secondary Insurance: 1D#: Group #:  SCHEDULE INFORMATION: Date: TBD Pt Will Call Back Time: [place]

## 2020-02-22 DIAGNOSIS — C44612 Basal cell carcinoma of skin of right upper limb, including shoulder: Secondary | ICD-10-CM | POA: Diagnosis not present

## 2020-02-22 DIAGNOSIS — C4491 Basal cell carcinoma of skin, unspecified: Secondary | ICD-10-CM | POA: Diagnosis not present

## 2020-02-24 NOTE — Chronic Care Management (AMB) (Signed)
°  Chronic Care Management   Outreach Note  02/24/2020 Name: Jack Estrada MRN: [id number] DOB: 06/30/1946  Jack Estrada is a 74 y.o. year old male who is a primary care patient of Crissman, Jeannette How, MD. I reached out to Jack Estrada by phone today in response to a referral sent by Mr. [place] PCP, Merrie Roof PA-C     Third unsuccessful telephone outreach was attempted today. The patient was referred to the case management team for assistance with care management and care coordination. The patient's primary care provider has been notified of our unsuccessful attempts to make or maintain contact with the patient. The care management team is pleased to engage with this patient at any time in the future should he/she be interested in assistance from the care management team.   Follow Up Plan: The care management team is available to follow up with the patient after provider conversation with the patient regarding recommendation for care management engagement and subsequent re-referral to the care management team.   Glenna Durand, LPN Health Advisor, [place] Management ??[email or web address]  ??[phone]

## 2020-03-06 DIAGNOSIS — E113293 Type 2 diabetes mellitus with mild nonproliferative diabetic retinopathy without macular edema, bilateral: Secondary | ICD-10-CM | POA: Diagnosis not present

## 2020-03-06 DIAGNOSIS — H43391 Other vitreous opacities, right eye: Secondary | ICD-10-CM | POA: Diagnosis not present

## 2020-03-06 LAB — HM DIABETES EYE EXAM

## 2020-03-20 ENCOUNTER — Other Ambulatory Visit: Payer: Self-pay

## 2020-03-20 DIAGNOSIS — E119 Type 2 diabetes mellitus without complications: Secondary | ICD-10-CM

## 2020-03-20 MED ORDER — LANTUS SOLOSTAR 100 UNIT/ML ~~LOC~~ SOPN: PEN_INJECTOR | SUBCUTANEOUS | 10 refills | Status: DC

## 2020-03-20 NOTE — Telephone Encounter (Signed)
LOV: 02/10/2020 Last filled 03/01/2019 for 15 mL with 10 refills.

## 2020-03-24 ENCOUNTER — Other Ambulatory Visit: Payer: Self-pay | Admitting: Nurse Practitioner

## 2020-04-04 ENCOUNTER — Other Ambulatory Visit: Payer: Self-pay | Admitting: Family Medicine

## 2020-04-04 DIAGNOSIS — Z794 Long term (current) use of insulin: Secondary | ICD-10-CM

## 2020-04-09 ENCOUNTER — Ambulatory Visit: Payer: Medicare Other | Admitting: Family Medicine

## 2020-04-30 ENCOUNTER — Other Ambulatory Visit: Payer: Self-pay

## 2020-04-30 ENCOUNTER — Encounter: Payer: Self-pay | Admitting: Family Medicine

## 2020-04-30 ENCOUNTER — Ambulatory Visit (INDEPENDENT_AMBULATORY_CARE_PROVIDER_SITE_OTHER): Payer: Medicare Other | Admitting: Family Medicine

## 2020-04-30 VITALS — BP 115/75 | HR 70 | Temp 98.3°F | Wt 216.0 lb

## 2020-04-30 DIAGNOSIS — H6123 Impacted cerumen, bilateral: Secondary | ICD-10-CM

## 2020-04-30 MED ORDER — AMOXICILLIN-POT CLAVULANATE 875-125 MG PO TABS: 1.0000 | ORAL_TABLET | Freq: Two times a day (BID) | ORAL | 0 refills | Status: DC

## 2020-04-30 NOTE — Progress Notes (Signed)
   BP 115/75   Pulse 70   Temp 98.3 F (36.8 C) (Oral)   Wt 216 lb (98 kg)   SpO2 96%   BMI 29.29 kg/m    Subjective:    Patient ID: Jack Estrada, male    DOB: [birth year], 74 y.o.   MRN: [id number]  HPI: Jack Estrada is a 74 y.o. male  Chief Complaint  Patient presents with  . Ear Problem    pt states can't hear well   . Edema    under right ear/jaw since this morning   Started this morning with swelling and tenderness under right jaw and below right ear, muffled hearing on the right. Denies fever, chills, sweats. Started some old antibiotics he had at home from another time yesterday and maybe notes some early benefit with this. Denies fever, chills, congestion, sore throat, headaches.   Relevant past medical, surgical, family and social history reviewed and updated as indicated. Interim medical history since our last visit reviewed. Allergies and medications reviewed and updated.  Review of Systems  Per HPI unless specifically indicated above     Objective:    BP 115/75   Pulse 70   Temp 98.3 F (36.8 C) (Oral)   Wt 216 lb (98 kg)   SpO2 96%   BMI 29.29 kg/m   Wt Readings from Last 3 Encounters:  04/30/20 216 lb (98 kg)  02/10/20 216 lb (98 kg)  02/08/20 220 lb (99.8 kg)    Physical Exam Vitals and nursing note reviewed.  Constitutional:      Appearance: Normal appearance.  HENT:     Head: Atraumatic.     Ears:     Comments: B/l EACs impacted with cerumen External right ear ttp, no discoloration Eyes:     Extraocular Movements: Extraocular movements intact.     Conjunctiva/sclera: Conjunctivae normal.  Cardiovascular:     Rate and Rhythm: Normal rate and regular rhythm.  Pulmonary:     Effort: Pulmonary effort is normal.     Breath sounds: Normal breath sounds.  Musculoskeletal:        General: Normal range of motion.     Cervical back: Normal range of motion and neck supple.  Lymphadenopathy:     Head:     Right side of head: Submandibular and  preauricular adenopathy present.  Skin:    General: Skin is warm and dry.  Neurological:     General: No focal deficit present.     Mental Status: He is oriented to person, place, and time.  Psychiatric:        Mood and Affect: Mood normal.        Thought Content: Thought content normal.        Judgment: Judgment normal.     Results for orders placed or performed in visit on 03/07/20  HM DIABETES EYE EXAM  Result Value Ref Range   HM Diabetic Eye Exam Retinopathy (A) No Retinopathy      Assessment & Plan:   Problem List Items Addressed This Visit    None    Visit Diagnoses    Bilateral impacted cerumen    -  Primary   Debrox drops several times daily x 1-2 wks, f/u for lavage if not clearing. Augmentin sent for possible R otitits media. No lavage d/t possible infection today       Follow up plan: Return if symptoms worsen or fail to improve.

## 2020-05-07 ENCOUNTER — Encounter: Payer: Self-pay | Admitting: Family Medicine

## 2020-05-07 ENCOUNTER — Ambulatory Visit (INDEPENDENT_AMBULATORY_CARE_PROVIDER_SITE_OTHER): Payer: Medicare Other | Admitting: Family Medicine

## 2020-05-07 ENCOUNTER — Other Ambulatory Visit: Payer: Self-pay

## 2020-05-07 VITALS — BP 128/67 | HR 58 | Temp 97.6°F | Wt 213.0 lb

## 2020-05-07 DIAGNOSIS — H6121 Impacted cerumen, right ear: Secondary | ICD-10-CM | POA: Diagnosis not present

## 2020-05-07 NOTE — Progress Notes (Signed)
   BP 128/67   Pulse (!) 58   Temp 97.6 F (36.4 C) (Oral)   Wt 213 lb (96.6 kg)   SpO2 97%   BMI 28.89 kg/m    Subjective:    Patient ID: Jack Estrada, male    DOB: 04-26-1946, 74 y.o.   MRN: [id number]  HPI: Jack Estrada is a 74 y.o. male  Chief Complaint  Patient presents with  . Ear Problem    right ear   Here today following up on right ear cerumen impaction and sudden loss of hearing. Has been using debrox drops several times daily for the past week as instructed without relief. The antibiotic did clear up the majority of the ear pain, throbbing, and lymphedema on the right. Denies fever, chills.   Relevant past medical, surgical, family and social history reviewed and updated as indicated. Interim medical history since our last visit reviewed. Allergies and medications reviewed and updated.  Review of Systems  Per HPI unless specifically indicated above     Objective:    BP 128/67   Pulse (!) 58   Temp 97.6 F (36.4 C) (Oral)   Wt 213 lb (96.6 kg)   SpO2 97%   BMI 28.89 kg/m   Wt Readings from Last 3 Encounters:  05/07/20 213 lb (96.6 kg)  04/30/20 216 lb (98 kg)  02/10/20 216 lb (98 kg)    Physical Exam Vitals and nursing note reviewed.  Constitutional:      Appearance: Normal appearance.  HENT:     Head: Atraumatic.     Ears:     Comments: Small amount of cerumen present in left EAC, TM benign and visualized behind that  Right EAC completely impacted by cerumen Eyes:     Extraocular Movements: Extraocular movements intact.     Conjunctiva/sclera: Conjunctivae normal.  Cardiovascular:     Rate and Rhythm: Normal rate and regular rhythm.  Pulmonary:     Effort: Pulmonary effort is normal.     Breath sounds: Normal breath sounds.  Musculoskeletal:        General: Normal range of motion.     Cervical back: Normal range of motion and neck supple.  Lymphadenopathy:     Cervical: No cervical adenopathy.  Skin:    General: Skin is warm and dry.    Neurological:     General: No focal deficit present.     Mental Status: He is oriented to person, place, and time.  Psychiatric:        Mood and Affect: Mood normal.        Thought Content: Thought content normal.        Judgment: Judgment normal.     Results for orders placed or performed in visit on 03/07/20  HM DIABETES EYE EXAM  Result Value Ref Range   HM Diabetic Eye Exam Retinopathy (A) No Retinopathy      Assessment & Plan:   Problem List Items Addressed This Visit    None    Visit Diagnoses    Impacted cerumen of right ear    -  Primary   Lavage performed with warm water and lavage tool with full clearance of impaction and restoration of hearing. TM benign behind. Continue debrox prn       Follow up plan: Return for as scheduled.

## 2020-05-23 ENCOUNTER — Other Ambulatory Visit: Payer: Self-pay

## 2020-05-23 MED ORDER — AMLODIPINE BESYLATE 5 MG PO TABS: 5.0000 mg | ORAL_TABLET | Freq: Every day | ORAL | 1 refills | Status: DC

## 2020-05-23 MED ORDER — ATORVASTATIN CALCIUM 40 MG PO TABS: 40.0000 mg | ORAL_TABLET | Freq: Every day | ORAL | 1 refills | Status: AC

## 2020-05-23 NOTE — Telephone Encounter (Signed)
Previous RL patient. Last seen 01/05/20

## 2020-05-28 ENCOUNTER — Ambulatory Visit (INDEPENDENT_AMBULATORY_CARE_PROVIDER_SITE_OTHER): Payer: Medicare Other | Admitting: Nurse Practitioner

## 2020-05-28 ENCOUNTER — Other Ambulatory Visit: Payer: Self-pay

## 2020-05-28 ENCOUNTER — Ambulatory Visit
Admission: RE | Admit: 2020-05-28 | Discharge: 2020-05-28 | Disposition: A | Discharge status: Home / Self Care | Payer: Medicare Other | Source: Ambulatory Visit | Attending: Nurse Practitioner | Admitting: Nurse Practitioner

## 2020-05-28 ENCOUNTER — Encounter: Payer: Self-pay | Admitting: Nurse Practitioner

## 2020-05-28 ENCOUNTER — Ambulatory Visit
Admission: RE | Admit: 2020-05-28 | Discharge: 2020-05-28 | Disposition: A | Discharge status: Home / Self Care | Payer: Medicare Other | Attending: Nurse Practitioner | Admitting: Nurse Practitioner

## 2020-05-28 VITALS — BP 118/81 | HR 64 | Temp 97.6°F | Resp 16 | Ht 72.0 in | Wt 214.6 lb

## 2020-05-28 DIAGNOSIS — M25552 Pain in left hip: Secondary | ICD-10-CM

## 2020-05-28 DIAGNOSIS — I878 Other specified disorders of veins: Secondary | ICD-10-CM | POA: Diagnosis not present

## 2020-05-28 DIAGNOSIS — M4316 Spondylolisthesis, lumbar region: Secondary | ICD-10-CM | POA: Diagnosis not present

## 2020-05-28 DIAGNOSIS — M47816 Spondylosis without myelopathy or radiculopathy, lumbar region: Secondary | ICD-10-CM | POA: Diagnosis not present

## 2020-05-28 DIAGNOSIS — M2578 Osteophyte, vertebrae: Secondary | ICD-10-CM | POA: Diagnosis not present

## 2020-05-28 MED ORDER — TRAMADOL HCL 50 MG PO TABS
25.0000 mg | ORAL_TABLET | Freq: Two times a day (BID) | ORAL | 0 refills | Status: AC | PRN
Start: 2020-05-28 — End: 2020-06-02

## 2020-05-28 MED ORDER — TIZANIDINE HCL 4 MG PO TABS: 4.0000 mg | ORAL_TABLET | Freq: Four times a day (QID) | ORAL | 0 refills | Status: DC | PRN

## 2020-05-28 NOTE — Patient Instructions (Addendum)
Voltaren gel and Icy/Hot Lidocaine patches + heat and ice  Hip Pain The hip is the joint between the upper legs and the lower pelvis. The bones, cartilage, tendons, and muscles of your hip joint support your body and allow you to move around. Hip pain can range from a minor ache to severe pain in one or both of your hips. The pain may be felt on the inside of the hip joint near the groin, or on the outside near the buttocks and upper thigh. You may also have swelling or stiffness in your hip area. Follow these instructions at home: Managing pain, stiffness, and swelling      If directed, put ice on the painful area. To do this: ? Put ice in a plastic bag. ? Place a towel between your skin and the bag. ? Leave the ice on for 20 minutes, 2-3 times a day.  If directed, apply heat to the affected area as often as told by your health care provider. Use the heat source that your health care provider recommends, such as a moist heat pack or a heating pad. ? Place a towel between your skin and the heat source. ? Leave the heat on for 20-30 minutes. ? Remove the heat if your skin turns bright red. This is especially important if you are unable to feel pain, heat, or cold. You may have a greater risk of getting burned. Activity  Do exercises as told by your health care provider.  Avoid activities that cause pain. General instructions   Take over-the-counter and prescription medicines only as told by your health care provider.  Keep a journal of your symptoms. Write down: ? How often you have hip pain. ? The location of your pain. ? What the pain feels like. ? What makes the pain worse.  Sleep with a pillow between your legs on your most comfortable side.  Keep all follow-up visits as told by your health care provider. This is important. Contact a health care provider if:  You cannot put weight on your leg.  Your pain or swelling continues or gets worse after one week.  It gets  harder to walk.  You have a fever. Get help right away if:  You fall.  You have a sudden increase in pain and swelling in your hip.  Your hip is red or swollen or very tender to touch. Summary  Hip pain can range from a minor ache to severe pain in one or both of your hips.  The pain may be felt on the inside of the hip joint near the groin, or on the outside near the buttocks and upper thigh.  Avoid activities that cause pain.  Write down how often you have hip pain, the location of the pain, what makes it worse, and what it feels like. This information is not intended to replace advice given to you by your health care provider. Make sure you discuss any questions you have with your health care provider. Document Revised: 01/17/2019 Document Reviewed: 01/17/2019 Elsevier Patient Education  [place].
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
## 2020-05-30 ENCOUNTER — Other Ambulatory Visit: Payer: Self-pay | Admitting: Nurse Practitioner

## 2020-05-30 DIAGNOSIS — M25552 Pain in left hip: Secondary | ICD-10-CM

## 2020-05-30 NOTE — Progress Notes (Signed)
Referral to Physical Therapy placed.

## 2020-06-23 ENCOUNTER — Encounter: Payer: Self-pay | Admitting: Nurse Practitioner

## 2020-06-25 ENCOUNTER — Ambulatory Visit: Payer: Medicare Other | Admitting: Nurse Practitioner

## 2020-07-22 ENCOUNTER — Emergency Department: Payer: Medicare Other

## 2020-07-22 ENCOUNTER — Other Ambulatory Visit: Payer: Self-pay

## 2020-07-22 ENCOUNTER — Inpatient Hospital Stay
Admission: EM | Admit: 2020-07-22 | Discharge: 2020-07-29 | DRG: 312 | Disposition: A | Discharge status: Home Health | Payer: Medicare Other | Attending: Internal Medicine | Admitting: Internal Medicine

## 2020-07-22 ENCOUNTER — Encounter: Payer: Self-pay | Admitting: Emergency Medicine

## 2020-07-22 DIAGNOSIS — Z79899 Other long term (current) drug therapy: Secondary | ICD-10-CM

## 2020-07-22 DIAGNOSIS — M542 Cervicalgia: Secondary | ICD-10-CM

## 2020-07-22 DIAGNOSIS — R569 Unspecified convulsions: Secondary | ICD-10-CM | POA: Diagnosis not present

## 2020-07-22 DIAGNOSIS — R001 Bradycardia, unspecified: Secondary | ICD-10-CM | POA: Diagnosis not present

## 2020-07-22 DIAGNOSIS — I152 Hypertension secondary to endocrine disorders: Secondary | ICD-10-CM

## 2020-07-22 DIAGNOSIS — N400 Enlarged prostate without lower urinary tract symptoms: Secondary | ICD-10-CM | POA: Diagnosis present

## 2020-07-22 DIAGNOSIS — E785 Hyperlipidemia, unspecified: Secondary | ICD-10-CM

## 2020-07-22 DIAGNOSIS — Z808 Family history of malignant neoplasm of other organs or systems: Secondary | ICD-10-CM

## 2020-07-22 DIAGNOSIS — R739 Hyperglycemia, unspecified: Secondary | ICD-10-CM

## 2020-07-22 DIAGNOSIS — Z888 Allergy status to other drugs, medicaments and biological substances status: Secondary | ICD-10-CM

## 2020-07-22 DIAGNOSIS — E1165 Type 2 diabetes mellitus with hyperglycemia: Secondary | ICD-10-CM | POA: Diagnosis not present

## 2020-07-22 DIAGNOSIS — I951 Orthostatic hypotension: Secondary | ICD-10-CM | POA: Diagnosis not present

## 2020-07-22 DIAGNOSIS — I129 Hypertensive chronic kidney disease with stage 1 through stage 4 chronic kidney disease, or unspecified chronic kidney disease: Secondary | ICD-10-CM | POA: Diagnosis not present

## 2020-07-22 DIAGNOSIS — E1069 Type 1 diabetes mellitus with other specified complication: Secondary | ICD-10-CM | POA: Diagnosis not present

## 2020-07-22 DIAGNOSIS — Z20822 Contact with and (suspected) exposure to covid-19: Secondary | ICD-10-CM | POA: Diagnosis present

## 2020-07-22 DIAGNOSIS — F32A Depression, unspecified: Secondary | ICD-10-CM | POA: Diagnosis not present

## 2020-07-22 DIAGNOSIS — E10319 Type 1 diabetes mellitus with unspecified diabetic retinopathy without macular edema: Secondary | ICD-10-CM | POA: Diagnosis not present

## 2020-07-22 DIAGNOSIS — G919 Hydrocephalus, unspecified: Secondary | ICD-10-CM | POA: Diagnosis not present

## 2020-07-22 DIAGNOSIS — W19XXXA Unspecified fall, initial encounter: Secondary | ICD-10-CM | POA: Diagnosis present

## 2020-07-22 DIAGNOSIS — E1065 Type 1 diabetes mellitus with hyperglycemia: Secondary | ICD-10-CM | POA: Diagnosis present

## 2020-07-22 DIAGNOSIS — N1831 Chronic kidney disease, stage 3a: Secondary | ICD-10-CM | POA: Diagnosis present

## 2020-07-22 DIAGNOSIS — I6782 Cerebral ischemia: Secondary | ICD-10-CM | POA: Diagnosis not present

## 2020-07-22 DIAGNOSIS — E1169 Type 2 diabetes mellitus with other specified complication: Secondary | ICD-10-CM | POA: Diagnosis not present

## 2020-07-22 DIAGNOSIS — G450 Vertebro-basilar artery syndrome: Secondary | ICD-10-CM | POA: Diagnosis present

## 2020-07-22 DIAGNOSIS — G9608 Other cranial cerebrospinal fluid leak: Secondary | ICD-10-CM | POA: Diagnosis not present

## 2020-07-22 DIAGNOSIS — S0101XA Laceration without foreign body of scalp, initial encounter: Secondary | ICD-10-CM

## 2020-07-22 DIAGNOSIS — H8110 Benign paroxysmal vertigo, unspecified ear: Secondary | ICD-10-CM | POA: Diagnosis present

## 2020-07-22 DIAGNOSIS — R6884 Jaw pain: Secondary | ICD-10-CM | POA: Diagnosis not present

## 2020-07-22 DIAGNOSIS — F419 Anxiety disorder, unspecified: Secondary | ICD-10-CM

## 2020-07-22 DIAGNOSIS — R300 Dysuria: Secondary | ICD-10-CM | POA: Diagnosis present

## 2020-07-22 DIAGNOSIS — Z8249 Family history of ischemic heart disease and other diseases of the circulatory system: Secondary | ICD-10-CM | POA: Diagnosis not present

## 2020-07-22 DIAGNOSIS — Z833 Family history of diabetes mellitus: Secondary | ICD-10-CM

## 2020-07-22 DIAGNOSIS — S060X9A Concussion with loss of consciousness of unspecified duration, initial encounter: Secondary | ICD-10-CM | POA: Diagnosis not present

## 2020-07-22 DIAGNOSIS — E1022 Type 1 diabetes mellitus with diabetic chronic kidney disease: Secondary | ICD-10-CM | POA: Diagnosis present

## 2020-07-22 DIAGNOSIS — S199XXA Unspecified injury of neck, initial encounter: Secondary | ICD-10-CM | POA: Diagnosis not present

## 2020-07-22 DIAGNOSIS — H8111 Benign paroxysmal vertigo, right ear: Secondary | ICD-10-CM | POA: Diagnosis not present

## 2020-07-22 DIAGNOSIS — A630 Anogenital (venereal) warts: Secondary | ICD-10-CM | POA: Diagnosis present

## 2020-07-22 DIAGNOSIS — Z794 Long term (current) use of insulin: Secondary | ICD-10-CM | POA: Diagnosis not present

## 2020-07-22 DIAGNOSIS — N179 Acute kidney failure, unspecified: Secondary | ICD-10-CM | POA: Diagnosis not present

## 2020-07-22 DIAGNOSIS — G458 Other transient cerebral ischemic attacks and related syndromes: Secondary | ICD-10-CM | POA: Diagnosis not present

## 2020-07-22 DIAGNOSIS — E860 Dehydration: Secondary | ICD-10-CM | POA: Diagnosis present

## 2020-07-22 DIAGNOSIS — R6889 Other general symptoms and signs: Secondary | ICD-10-CM | POA: Diagnosis not present

## 2020-07-22 DIAGNOSIS — R58 Hemorrhage, not elsewhere classified: Secondary | ICD-10-CM | POA: Diagnosis not present

## 2020-07-22 DIAGNOSIS — S0990XA Unspecified injury of head, initial encounter: Secondary | ICD-10-CM | POA: Diagnosis not present

## 2020-07-22 DIAGNOSIS — F339 Major depressive disorder, recurrent, unspecified: Secondary | ICD-10-CM | POA: Diagnosis not present

## 2020-07-22 DIAGNOSIS — E119 Type 2 diabetes mellitus without complications: Secondary | ICD-10-CM | POA: Diagnosis not present

## 2020-07-22 DIAGNOSIS — R55 Syncope and collapse: Secondary | ICD-10-CM

## 2020-07-22 DIAGNOSIS — Z823 Family history of stroke: Secondary | ICD-10-CM

## 2020-07-22 DIAGNOSIS — S060XAA Concussion with loss of consciousness status unknown, initial encounter: Secondary | ICD-10-CM | POA: Diagnosis present

## 2020-07-22 DIAGNOSIS — E1159 Type 2 diabetes mellitus with other circulatory complications: Secondary | ICD-10-CM | POA: Diagnosis not present

## 2020-07-22 DIAGNOSIS — Z743 Need for continuous supervision: Secondary | ICD-10-CM | POA: Diagnosis not present

## 2020-07-22 LAB — CBG MONITORING, ED
Glucose-Capillary: 237 mg/dL — ABNORMAL HIGH (ref 70–99)
Glucose-Capillary: 148 mg/dL — ABNORMAL HIGH (ref 70–99)
Glucose-Capillary: 133 mg/dL — ABNORMAL HIGH (ref 70–99)

## 2020-07-22 LAB — CBC WITH DIFFERENTIAL/PLATELET
Abs Immature Granulocytes: 0.02 10*3/uL (ref 0.00–0.07)
Basophils Absolute: 0 10*3/uL (ref 0.0–0.1)
Basophils Relative: 0 %
Eosinophils Absolute: 0.2 10*3/uL (ref 0.0–0.5)
Eosinophils Relative: 3 %
HCT: 39.4 % (ref 39.0–52.0)
Hemoglobin: 13.7 g/dL (ref 13.0–17.0)
Immature Granulocytes: 0 %
Lymphocytes Relative: 41 %
Lymphs Abs: 3.3 10*3/uL (ref 0.7–4.0)
MCH: 30.4 pg (ref 26.0–34.0)
MCHC: 34.8 g/dL (ref 30.0–36.0)
MCV: 87.4 fL (ref 80.0–100.0)
Monocytes Absolute: 0.6 10*3/uL (ref 0.1–1.0)
Monocytes Relative: 7 %
Neutro Abs: 3.8 10*3/uL (ref 1.7–7.7)
Neutrophils Relative %: 49 %
Platelets: 149 10*3/uL — ABNORMAL LOW (ref 150–400)
RBC: 4.51 MIL/uL (ref 4.22–5.81)
RDW: 12.7 % (ref 11.5–15.5)
WBC: 7.9 10*3/uL (ref 4.0–10.5)
nRBC: 0 % (ref 0.0–0.2)

## 2020-07-22 LAB — RESPIRATORY PANEL BY RT PCR (FLU A&B, COVID)
Influenza A by PCR: NEGATIVE
Influenza B by PCR: NEGATIVE
SARS Coronavirus 2 by RT PCR: NEGATIVE

## 2020-07-22 LAB — TROPONIN I (HIGH SENSITIVITY)
Troponin I (High Sensitivity): 7 ng/L (ref <18)
Troponin I (High Sensitivity): 6 ng/L (ref <18)

## 2020-07-22 LAB — COMPREHENSIVE METABOLIC PANEL
ALT: 26 U/L (ref 0–44)
AST: 25 U/L (ref 15–41)
Albumin: 3.7 g/dL (ref 3.5–5.0)
Alkaline Phosphatase: 78 U/L (ref 38–126)
Anion gap: 16 — ABNORMAL HIGH (ref 5–15)
BUN: 24 mg/dL — ABNORMAL HIGH (ref 8–23)
CO2: 19 mmol/L — ABNORMAL LOW (ref 22–32)
Calcium: 10.2 mg/dL (ref 8.9–10.3)
Chloride: 99 mmol/L (ref 98–111)
Creatinine, Ser: 1.56 mg/dL — ABNORMAL HIGH (ref 0.61–1.24)
GFR, Estimated: 47 mL/min — ABNORMAL LOW (ref >60)
Glucose, Bld: 264 mg/dL — ABNORMAL HIGH (ref 70–99)
Potassium: 3.7 mmol/L (ref 3.5–5.1)
Sodium: 134 mmol/L — ABNORMAL LOW (ref 135–145)
Total Bilirubin: 1.2 mg/dL (ref 0.3–1.2)
Total Protein: 7.1 g/dL (ref 6.5–8.1)

## 2020-07-22 LAB — BRAIN NATRIURETIC PEPTIDE: B Natriuretic Peptide: 29.3 pg/mL (ref 0.0–100.0)

## 2020-07-22 LAB — MAGNESIUM: Magnesium: 1.6 mg/dL — ABNORMAL LOW (ref 1.7–2.4)

## 2020-07-22 LAB — TSH: TSH: 2.945 u[IU]/mL (ref 0.350–4.500)

## 2020-07-22 MED ORDER — ACETAMINOPHEN 325 MG PO TABS
650.0000 mg | ORAL_TABLET | Freq: Four times a day (QID) | ORAL | Status: DC | PRN
  Administered 2020-07-22 – 2020-07-23 (×3): 650 mg via ORAL
  Filled 2020-07-22 (×3): qty 2

## 2020-07-22 MED ORDER — ARIPIPRAZOLE 5 MG PO TABS
5.0000 mg | ORAL_TABLET | Freq: Every day | ORAL | Status: DC
  Administered 2020-07-24 – 2020-07-29 (×6): 5 mg via ORAL
  Filled 2020-07-22 (×8): qty 1

## 2020-07-22 MED ORDER — ALUM & MAG HYDROXIDE-SIMETH 200-200-20 MG/5ML PO SUSP
30.0000 mL | Freq: Once | ORAL | Status: AC
  Administered 2020-07-22: 30 mL via ORAL
  Filled 2020-07-22: qty 30

## 2020-07-22 MED ORDER — LORAZEPAM 2 MG/ML IJ SOLN: 1.0000 mg | Freq: Two times a day (BID) | INTRAMUSCULAR | Status: DC | PRN

## 2020-07-22 MED ORDER — FAMOTIDINE IN NACL 20-0.9 MG/50ML-% IV SOLN
20.0000 mg | Freq: Two times a day (BID) | INTRAVENOUS | Status: DC
  Administered 2020-07-22 – 2020-07-24 (×4): 20 mg via INTRAVENOUS
  Filled 2020-07-22 (×6): qty 50

## 2020-07-22 MED ORDER — ACETAMINOPHEN 650 MG RE SUPP: 650.0000 mg | Freq: Four times a day (QID) | RECTAL | Status: DC | PRN

## 2020-07-22 MED ORDER — MAGNESIUM SULFATE 2 GM/50ML IV SOLN
2.0000 g | Freq: Once | INTRAVENOUS | Status: AC
  Administered 2020-07-22: 2 g via INTRAVENOUS
  Filled 2020-07-22: qty 50

## 2020-07-22 MED ORDER — INSULIN GLARGINE 100 UNIT/ML ~~LOC~~ SOLN
30.0000 [IU] | Freq: Every day | SUBCUTANEOUS | Status: DC
  Administered 2020-07-24 – 2020-07-29 (×5): 30 [IU] via SUBCUTANEOUS
  Filled 2020-07-22 (×8): qty 0.3

## 2020-07-22 MED ORDER — ONDANSETRON HCL 4 MG/2ML IJ SOLN
4.0000 mg | Freq: Once | INTRAMUSCULAR | Status: AC
  Administered 2020-07-22: 4 mg via INTRAVENOUS
  Filled 2020-07-22: qty 2

## 2020-07-22 MED ORDER — ATORVASTATIN CALCIUM 20 MG PO TABS
40.0000 mg | ORAL_TABLET | Freq: Every day | ORAL | Status: DC
  Administered 2020-07-23 – 2020-07-29 (×7): 40 mg via ORAL
  Filled 2020-07-22 (×7): qty 2

## 2020-07-22 MED ORDER — ENOXAPARIN SODIUM 40 MG/0.4ML ~~LOC~~ SOLN
40.0000 mg | SUBCUTANEOUS | Status: DC
  Administered 2020-07-22 – 2020-07-28 (×6): 40 mg via SUBCUTANEOUS
  Filled 2020-07-22 (×7): qty 0.4

## 2020-07-22 MED ORDER — ONDANSETRON HCL 4 MG/2ML IJ SOLN
4.0000 mg | Freq: Four times a day (QID) | INTRAMUSCULAR | Status: AC | PRN
  Administered 2020-07-22 – 2020-07-23 (×3): 4 mg via INTRAVENOUS
  Filled 2020-07-22 (×3): qty 2

## 2020-07-22 MED ORDER — SODIUM CHLORIDE 0.9% FLUSH
3.0000 mL | Freq: Two times a day (BID) | INTRAVENOUS | Status: DC
  Administered 2020-07-22 – 2020-07-29 (×9): 3 mL via INTRAVENOUS

## 2020-07-22 MED ORDER — AMLODIPINE BESYLATE 5 MG PO TABS: 5.0000 mg | ORAL_TABLET | Freq: Every day | ORAL | Status: DC

## 2020-07-22 MED ORDER — MORPHINE SULFATE (PF) 2 MG/ML IV SOLN
1.0000 mg | Freq: Once | INTRAVENOUS | Status: AC | PRN
  Administered 2020-07-23: 22:00:00 1 mg via INTRAVENOUS
  Filled 2020-07-22: qty 1

## 2020-07-22 MED ORDER — FLUOXETINE HCL 20 MG PO CAPS
40.0000 mg | ORAL_CAPSULE | Freq: Every day | ORAL | Status: DC
  Administered 2020-07-24 – 2020-07-29 (×6): 40 mg via ORAL
  Filled 2020-07-22 (×6): qty 2

## 2020-07-22 MED ORDER — LACTATED RINGERS IV BOLUS
1000.0000 mL | Freq: Once | INTRAVENOUS | Status: AC
  Administered 2020-07-22: 1000 mL via INTRAVENOUS

## 2020-07-22 MED ORDER — LORAZEPAM 1 MG PO TABS
1.0000 mg | ORAL_TABLET | Freq: Every day | ORAL | Status: DC | PRN
  Administered 2020-07-22 – 2020-07-28 (×5): 1 mg via ORAL
  Filled 2020-07-22 (×6): qty 1

## 2020-07-22 MED ORDER — LIDOCAINE VISCOUS HCL 2 % MT SOLN
15.0000 mL | Freq: Once | OROMUCOSAL | Status: AC
  Administered 2020-07-22: 15 mL via ORAL
  Filled 2020-07-22: qty 15

## 2020-07-22 MED ORDER — MIDAZOLAM HCL 2 MG/2ML IJ SOLN
1.0000 mg | Freq: Once | INTRAMUSCULAR | Status: AC
  Administered 2020-07-22: 1 mg via INTRAVENOUS
  Filled 2020-07-22: qty 2

## 2020-07-22 MED ORDER — DICYCLOMINE HCL 10 MG/5ML PO SOLN
10.0000 mg | Freq: Once | ORAL | Status: DC
  Filled 2020-07-22 (×2): qty 5

## 2020-07-22 MED ORDER — INSULIN ASPART 100 UNIT/ML ~~LOC~~ SOLN
0.0000 [IU] | SUBCUTANEOUS | Status: DC
  Administered 2020-07-22: 8 [IU] via SUBCUTANEOUS
  Administered 2020-07-23: 3 [IU] via SUBCUTANEOUS
  Filled 2020-07-22: qty 1
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
## 2020-07-22 NOTE — ED Notes (Addendum)
Dr COx at bedside verbal order for gauze wrap of head  Also advised by primary to treat pt with regular anti- anxiety meds given pt's hx and regular use of such

## 2020-07-22 NOTE — ED Notes (Addendum)
Pt became dizzy and nauseated during nuero check speciffically with head movement  Unable to complete orthostatic VS due to nausea

## 2020-07-22 NOTE — ED Notes (Addendum)
Pt c/o of heartburn, primary messaged  Pt given applesauce with meds
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
## 2020-07-22 NOTE — ED Notes (Signed)
"  god-daughter" leaving bedside att, may return later, reports that pt's mentation is as normal but still lots of neck pain and dizziness

## 2020-07-22 NOTE — H&P (Addendum)
History and Physical   Jack Estrada TMH:[id number] DOB: 1946/06/04 DOA: 07/22/2020  PCP: Jack Maple, Estrada / Dr. Orene Estrada Outpatient Specialists: Dr. Margrett Estrada (urologist) and Dr. Kern Estrada (Dermatologist) Patient coming from: home  I have personally briefly reviewed patient's old medical records in [place].  Chief Concern: syncope  HPI: Jack Estrada is a 74 y.o. male with medical history significant for hypertension, IDDM, depression/anxiety, history of penile condyloma acuminata initially thought as [facility] pathologist read as lichen planus) status post resection 10/19/19, hyperlipidemia, presented to the emergency department via EMS for chief concerns of syncopal episode.  Jack Estrada states that he went to Jack Estrada to pick up an order. He states was telling them the order name and the next thing he remembers was that he was talking to medics.  He denies reports of seizure activity, urinary, bowel incontinence.  He denies feeling palpitation or shortness of breath prior to syncopal episode.  He endorses right posterior head pain. He endorses jaw pain. He states that he has gotten dizzy with insulin use in the past and this feels similar but worse. He reports passing out two years ago. He was walking too much while playing disc golf then and sliped and fell. He did not get evaluated for that time.   He reports feeling his normal self in the last couple of days with minimal dizziness.   Lost about 30 lbs intentionally in the last year. Denies vision changes, chest pain, shortness of breath, abdominal pain, SI/HI, insomnia, diarrhea, dysuria, hematuria, back pain.   Denies changes to medications and diet.   He states that he was tested for syphilis and was told negative.  I reviewed care everywhere, and was not able to locate an RPR or a VDRL test ordered or its result.  Last BM was 07/23/20 and it was normal, not black, and not red.   He endorses taking  nyquil to sleep for several years.   Denies history of colonoscopy. Mother had thyroid cancer in 50/60s and passed at age 64.   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 No alcohol this year.   ED Course: Discussed with ED provider, requesting admission for syncope work-up  Review of Systems: As per HPI otherwise 10 point review of systems negative.   Assessment/Plan  Principal Problem:   Syncope Active Problems:   Insulin dependent type 2 diabetes mellitus (Jack Estrada)   Hyperlipidemia associated with type 2 diabetes mellitus (Jack Estrada)   Hypertension associated with type 2 diabetes mellitus (Jack Estrada)   BPH (benign prostatic hyperplasia)   Depression, recurrent (Jack Estrada)   Anxiety   Syncope-work-up in progress -Nausea and vomiting-CT scan of the head was read as negative for evidence of acute traumatic brain injury -CT cervical spine read as no acute fracture -Low clinical suspicion for ACS, as troponins were negative x2, patient denies chest pain, and no ST elevation on EKG -Echo has been ordered, check TSH -Orthostatic -A.m. provider to consult cardiology if indicated -Patient would likely benefit from 14-day event cardiac monitor - Neurovascular checks q4h - No signs of mental status change, no indication for mri at this time  Insulin-dependent diabetes mellitus-checking A1c, resume home glargine 30 units subcu -Insulin aspart sliding scale  Concussion symptoms-monitoring, advised patient to avoid high impact sports in the next 6 months to 1 year -Monitoring  Hyperlipidemia-resumed atorvastatin 40  mg nightly  Nausea vomiting - present on admission and improved with IV Zofran  GERD symptoms-GI cocktail ordered and Pepcid IV twice daily ordered  Depression/anxiety -resumed home lorazepam 1 mg PO, Abilify 5 mg p.o. daily, and fluoxetine - Ativan IV 1 mg ordered if  patient can't tolerate PO  Posterior scalp laceration x2- POA; I examined the wound and deemed the depth of wound is not suitable for suture or TXA application - approximately 6-8 cm in length to both lacerations - Nursing verbal order for gauze wrap of the head, RN acknowledged.   DVT prophylaxis: Enoxaparin Code Status: full code  Diet: NPO with orders to advance diet to heart healthy/carb modified if patient can tolerate Family Communication: Discussed with goddaughter at bedside Disposition Plan: Pending clinical course Consults called:  Admission status: observation with telemetry   Past Medical History:  Diagnosis Date  . Chronic kidney disease   . Hyperlipidemia   . Hypertension   . Retinopathy due to secondary diabetes Jack Estrada)    Past Surgical History:  Procedure Laterality Date  . thumb surgery Left March 2016   Social History:  reports that he has never smoked. He has never used smokeless tobacco. He reports that he does not drink alcohol and does not use drugs.  Allergies  Allergen Reactions  . Benazepril Cough   Family History  Problem Relation Age of Onset  . Diabetes Mother   . Heart disease Father   . Stroke Father    Family history: Family history reviewed and father had two heart surgeries and passed at age 47. His two biological sons were diagnosed with bradycardia in their 40s/50s.  Prior to Admission medications   Medication Sig Start Date End Date Taking? Authorizing Provider  amLODipine (NORVASC) 5 MG tablet Take 1 tablet (5 mg total) by mouth daily. 05/23/20   Cannady, Jack Estrada  ARIPiprazole (ABILIFY) 5 MG tablet Take 1 tablet (5 mg total) by mouth daily. 01/05/20   Jack Estrada  atorvastatin (LIPITOR) 40 MG tablet Take 1 tablet (40 mg total) by mouth daily. 05/23/20   Jack Estrada  BAYER MICROLET LANCETS lancets Use to check sugar twice a day 01/26/17   Jack Estrada  FLUoxetine (PROZAC) 40 MG capsule Take 1 capsule (40 mg  total) by mouth daily. 01/05/20   Jack Estrada  glucose blood (BAYER CONTOUR TEST) test strip Use to check sugar twice a day 01/26/17   Jack Estrada  Insulin Glargine-Lixisenatide (SOLIQUA) 100-33 UNT-MCG/ML SOPN Inject 30 Units into the skin daily. 01/05/20   Jack Estrada  insulin lispro (HUMALOG KWIKPEN) 100 UNIT/ML KwikPen Inject 0.2-0.3 mLs (20-30 Units total) into the skin 3 (three) times daily. 09/23/19   Jack Estrada  LORazepam (ATIVAN) 1 MG tablet Take 1 tablet (1 mg total) by mouth daily as needed for anxiety. 01/05/20   Jack Estrada  sildenafil (VIAGRA) 50 MG tablet Take 1-2 tablets (50-100 mg total) by mouth daily as needed for erectile dysfunction. 01/05/20   Jack Estrada  tiZANidine (ZANAFLEX) 4 MG tablet Take 1 tablet (4 mg total) by mouth every 6 (six) hours as needed for muscle spasms. 05/28/20   Jack Estrada   Physical Exam: Vitals:   07/22/20 1945 07/22/20 2000 07/22/20 2030 07/22/20 2100  BP: (!) 153/71     Pulse: (!) 51 (!) 55 (!) 54 (!) 55  Resp: (!) 22 20 13 17   SpO2:  99% 100% 97% 99%  Weight:      Height:       Constitutional: appears uncomfortable and mildly distressed Eyes: PERRL, lids and conjunctivae normal ENMT: Mucous membranes are moist. Posterior pharynx clear of any exudate or lesions. Age-appropriate dentition. Hearing appropriate Neck: normal, supple, no masses, no thyromegaly Respiratory: clear to auscultation bilaterally, no wheezing, no crackles. Normal respiratory effort. No accessory muscle use.  Cardiovascular: Regular rate and rhythm, no murmurs / rubs / gallops. No extremity edema. 2+ pedal pulses. No carotid bruits.  Abdomen: no tenderness, no masses palpated, no hepatosplenomegaly. Bowel sounds positive.  Musculoskeletal: no clubbing / cyanosis. No joint deformity upper and lower extremities. Good ROM, no contractures, no atrophy. Normal muscle tone.  Skin:  posterior scalp shallow laceration with minimal bleeding. No induration Neurologic: CN 2-12 grossly intact. Sensation intact. Strength 5/5 in all 4.  Psychiatric: Normal judgment and insight. Alert and oriented x 3. Normal mood.   EKG: Independently reviewed, showing sinus bradycardia with a rate of 54  Chest x-ray on Admission: Personally reviewed and I agree with radiologist reading as below.  DG Chest 1 View  Result Date: 07/22/2020 CLINICAL DATA:  Syncope, fell EXAM: CHEST  1 VIEW COMPARISON:  10/15/2016 FINDINGS: The heart size and mediastinal contours are within normal limits. Both lungs are clear. The visualized skeletal structures are unremarkable. IMPRESSION: No active disease. Electronically Signed   By: Randa Ngo M.D.   On: 07/22/2020 15:17   CT Head Wo Contrast  Result Date: 07/22/2020 CLINICAL DATA:  74 year old male with history of syncopal episode. Fall with injury to the head and neck. EXAM: CT HEAD WITHOUT CONTRAST CT CERVICAL SPINE WITHOUT CONTRAST TECHNIQUE: Multidetector CT imaging of the head and cervical spine was performed following the standard protocol without intravenous contrast. Multiplanar CT image reconstructions of the cervical spine were also generated. COMPARISON:  No priors. FINDINGS: CT HEAD FINDINGS Brain: Patchy and confluent areas of decreased attenuation are noted throughout the deep and periventricular white matter of the cerebral hemispheres bilaterally, compatible with chronic microvascular ischemic disease. No evidence of acute infarction, hemorrhage, hydrocephalus, extra-axial collection or mass lesion/mass effect. Vascular: No hyperdense vessel or unexpected calcification. Skull: Normal. Negative for fracture or focal lesion. Sinuses/Orbits: No acute finding. Other: None. CT CERVICAL SPINE FINDINGS Alignment: Normal. Skull base and vertebrae: No acute fracture. No primary bone lesion or focal pathologic process. Soft tissues and spinal canal: No  prevertebral fluid or swelling. No visible canal hematoma. Disc levels: No significant degenerative disc disease or facet arthropathy. Upper chest: Negative. Other: None. IMPRESSION: 1. No evidence of significant acute traumatic injury to the skull or brain. 2. Mild chronic microvascular ischemic changes in the cerebral white matter. Electronically Signed   By: Vinnie Langton M.D.   On: 07/22/2020 14:31   CT Cervical Spine Wo Contrast  Result Date: 07/22/2020 CLINICAL DATA:  74 year old male with history of syncopal episode. Fall with injury to the head and neck. EXAM: CT HEAD WITHOUT CONTRAST CT CERVICAL SPINE WITHOUT CONTRAST TECHNIQUE: Multidetector CT imaging of the head and cervical spine was performed following the standard protocol without intravenous contrast. Multiplanar CT image reconstructions of the cervical spine were also generated. COMPARISON:  No priors. FINDINGS: CT HEAD FINDINGS Brain: Patchy and confluent areas of decreased attenuation are noted throughout the deep and periventricular white matter of the cerebral hemispheres bilaterally, compatible with chronic microvascular ischemic disease. No evidence of acute infarction, hemorrhage, hydrocephalus, extra-axial collection or mass lesion/mass effect. Vascular: No hyperdense  vessel or unexpected calcification. Skull: Normal. Negative for fracture or focal lesion. Sinuses/Orbits: No acute finding. Other: None. CT CERVICAL SPINE FINDINGS Alignment: Normal. Skull base and vertebrae: No acute fracture. No primary bone lesion or focal pathologic process. Soft tissues and spinal canal: No prevertebral fluid or swelling. No visible canal hematoma. Disc levels: No significant degenerative disc disease or facet arthropathy. Upper chest: Negative. Other: None. IMPRESSION: 1. No evidence of significant acute traumatic injury to the skull or brain. 2. Mild chronic microvascular ischemic changes in the cerebral white matter. Electronically Signed   By:  Vinnie Langton M.D.   On: 07/22/2020 14:31   Labs on Admission: I have personally reviewed following labs  CBC: Recent Labs  Lab 07/22/20 1334  WBC 7.9  NEUTROABS 3.8  HGB 13.7  HCT 39.4  MCV 87.4  PLT 458*   Basic Metabolic Panel: Recent Labs  Lab 07/22/20 1334  NA 134*  K 3.7  CL 99  CO2 19*  GLUCOSE 264*  BUN 24*  CREATININE 1.56*  CALCIUM 10.2  MG 1.6*   GFR: Estimated Creatinine Clearance: 52.1 mL/min (A) (by C-G formula based on SCr of 1.56 mg/dL (H)).  Liver Function Tests: Recent Labs  Lab 07/22/20 1334  AST 25  ALT 26  ALKPHOS 78  BILITOT 1.2  PROT 7.1  ALBUMIN 3.7   CBG: Recent Labs  Lab 07/22/20 1455 07/22/20 1732 07/22/20 2118  GLUCAP 237* 148* 133*   Urine analysis:    Component Value Date/Time   APPEARANCEUR Clear 07/19/2018 1500   GLUCOSEU Trace (A) 07/19/2018 1500   BILIRUBINUR Negative 07/19/2018 1500   PROTEINUR Trace (A) 07/19/2018 1500   NITRITE Negative 07/19/2018 1500   LEUKOCYTESUR Negative 07/19/2018 1500   Tacari Repass N Maysoon Lozada D.O. [facility]  If 12AM-7AM, please contact overnight-coverage provider If 7AM-7PM, please contact day coverage provider [email or web address]  07/23/2020, 12:02 AM

## 2020-07-23 ENCOUNTER — Other Ambulatory Visit: Payer: Self-pay

## 2020-07-23 ENCOUNTER — Encounter: Payer: Self-pay | Admitting: Internal Medicine

## 2020-07-23 ENCOUNTER — Observation Stay (HOSPITAL_BASED_OUTPATIENT_CLINIC_OR_DEPARTMENT_OTHER)
Admit: 2020-07-23 | Discharge: 2020-07-23 | Disposition: A | Discharge status: Home / Self Care | Payer: Medicare Other | Attending: Internal Medicine | Admitting: Internal Medicine

## 2020-07-23 DIAGNOSIS — S060XAA Concussion with loss of consciousness status unknown, initial encounter: Secondary | ICD-10-CM | POA: Diagnosis present

## 2020-07-23 DIAGNOSIS — R55 Syncope and collapse: Secondary | ICD-10-CM | POA: Diagnosis not present

## 2020-07-23 DIAGNOSIS — S060X9A Concussion with loss of consciousness of unspecified duration, initial encounter: Secondary | ICD-10-CM | POA: Diagnosis present

## 2020-07-23 LAB — HEMOGLOBIN A1C
Hgb A1c MFr Bld: 10.4 % — ABNORMAL HIGH (ref 4.8–5.6)
Mean Plasma Glucose: 251.78 mg/dL

## 2020-07-23 LAB — BASIC METABOLIC PANEL WITH GFR
Anion gap: 10 (ref 5–15)
BUN: 20 mg/dL (ref 8–23)
CO2: 29 mmol/L (ref 22–32)
Calcium: 9.3 mg/dL (ref 8.9–10.3)
Chloride: 99 mmol/L (ref 98–111)
Creatinine, Ser: 1.39 mg/dL — ABNORMAL HIGH (ref 0.61–1.24)
GFR, Estimated: 54 mL/min — ABNORMAL LOW
Glucose, Bld: 145 mg/dL — ABNORMAL HIGH (ref 70–99)
Potassium: 3.9 mmol/L (ref 3.5–5.1)
Sodium: 138 mmol/L (ref 135–145)

## 2020-07-23 LAB — CBC
HCT: 40 % (ref 39.0–52.0)
Hemoglobin: 13.5 g/dL (ref 13.0–17.0)
MCH: 30.4 pg (ref 26.0–34.0)
MCHC: 33.8 g/dL (ref 30.0–36.0)
MCV: 90.1 fL (ref 80.0–100.0)
Platelets: 145 K/uL — ABNORMAL LOW (ref 150–400)
RBC: 4.44 MIL/uL (ref 4.22–5.81)
RDW: 13 % (ref 11.5–15.5)
WBC: 7.6 K/uL (ref 4.0–10.5)
nRBC: 0 % (ref 0.0–0.2)

## 2020-07-23 LAB — GLUCOSE, CAPILLARY
Glucose-Capillary: 153 mg/dL — ABNORMAL HIGH (ref 70–99)
Glucose-Capillary: 304 mg/dL — ABNORMAL HIGH (ref 70–99)

## 2020-07-23 LAB — CBG MONITORING, ED: Glucose-Capillary: 121 mg/dL — ABNORMAL HIGH (ref 70–99)

## 2020-07-23 MED ORDER — SODIUM CHLORIDE 0.9 % IV SOLN: INTRAVENOUS | Status: DC

## 2020-07-23 MED ORDER — INSULIN ASPART 100 UNIT/ML ~~LOC~~ SOLN
0.0000 [IU] | Freq: Every day | SUBCUTANEOUS | Status: DC
  Administered 2020-07-23: 21:00:00 5 [IU] via SUBCUTANEOUS
  Administered 2020-07-25 – 2020-07-28 (×2): 2 [IU] via SUBCUTANEOUS
  Filled 2020-07-23 (×4): qty 1

## 2020-07-23 MED ORDER — INSULIN ASPART 100 UNIT/ML ~~LOC~~ SOLN
5.0000 [IU] | Freq: Three times a day (TID) | SUBCUTANEOUS | Status: DC
  Administered 2020-07-24 – 2020-07-25 (×6): 5 [IU] via SUBCUTANEOUS
  Filled 2020-07-23 (×6): qty 1

## 2020-07-23 MED ORDER — INSULIN ASPART 100 UNIT/ML ~~LOC~~ SOLN
0.0000 [IU] | Freq: Three times a day (TID) | SUBCUTANEOUS | Status: DC
  Administered 2020-07-24: 12:00:00 3 [IU] via SUBCUTANEOUS
  Administered 2020-07-24: 11:00:00 2 [IU] via SUBCUTANEOUS
  Administered 2020-07-24: 18:00:00 3 [IU] via SUBCUTANEOUS
  Administered 2020-07-25 (×2): 5 [IU] via SUBCUTANEOUS
  Administered 2020-07-25: 3 [IU] via SUBCUTANEOUS
  Filled 2020-07-23 (×5): qty 1

## 2020-07-23 MED ORDER — INFLUENZA VAC A&B SA ADJ QUAD 0.5 ML IM PRSY
0.5000 mL | PREFILLED_SYRINGE | INTRAMUSCULAR | Status: DC
  Filled 2020-07-23: qty 0.5

## 2020-07-23 MED ORDER — BUTALBITAL-APAP-CAFFEINE 50-325-40 MG PO TABS
1.0000 | ORAL_TABLET | Freq: Four times a day (QID) | ORAL | Status: DC | PRN
  Administered 2020-07-24 – 2020-07-27 (×7): 1 via ORAL
  Filled 2020-07-23 (×7): qty 1

## 2020-07-23 NOTE — Consult Note (Signed)
CARDIOLOGY CONSULT NOTE               Patient ID: Jack Estrada MRN: [id number] DOB/AGE: 72-01-1946 72 y.o.  Admit date: 07/22/2020 Referring Physician Dr. Dessa Phi  Primary Physician Dr. Golden Pop Primary Cardiologist Dr. Ubaldo Glassing Reason for Consultation Syncope  HPI: Jack Estrada is a 74 year old male with a past medical history significant for type 1 diabetes mellitus, chronic kidney disease, hyperlipidemia, and hypertension who presented to the ED on 07/22/20 following a syncopal episode.  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  Workup in the ED has been significant for blood glucose of 264, creatinine of 1.56, BUN of 24, GFR of 47, high sensitivity troponin negative x 2 at 7 and 6 respectively, BNP of 29, magnesium of 1.6, head CT negative for acute traumatic injury with mild, chronic microvascular ischemic changes, chest xray negative for acute cardiopulmonary disease, and ECG revealing sinus bradycardia at a ventricular rate of 54bpm with a short PR interval, no evidence of a delta wave.   Jack Estrada was evaluated in outpatient cardiology in 2016 for preoperative risk assessment prior to left thumb surgery.  ECG at the time was negative for evidence of ischemia.   07/23/20: Jack Estrada is lying in bed, in no acute distress. Following his syncopal episode, he experienced nausea, vomiting, and severe dizziness, which have all improved.  Prior to the syncopal episode, he admits to mild dizziness, but denies chest pain, palpitations, or heart racing.  He reports feeling in normal health prior to the episode and had eaten an hour before.  He denies recent illness or sick contacts.  He admits to one prior syncopal episode, occurring a few years ago, in the setting of dehydration.    Review of systems complete and found to be negative unless listed above     Past Medical History:  Diagnosis Date  . Chronic kidney  disease   . Hyperlipidemia   . Hypertension   . Retinopathy due to secondary diabetes [facility])     Past Surgical History:  Procedure Laterality Date  . thumb surgery Left March 2016    (Not in a hospital admission)  Social History   Socioeconomic History  . Marital status: Married    Spouse name: Not on file  . Number of children: Not on file  . Years of education: Not on file  . Highest education level: Not on file  Occupational History  . Not on file  Tobacco Use  . Smoking status: Never Smoker  . Smokeless tobacco: Never Used  Vaping Use  . Vaping Use: Never used  Substance and Sexual Activity  . Alcohol use: No  . Drug use: No  . Sexual activity: Not on file  Other Topics Concern  . Not on file  Social History Narrative  . Not on file   Social Determinants of Health   Financial Resource Strain:   . Difficulty of Paying Living Expenses: Not on file  Food Insecurity:   . Worried About Charity fundraiser in the Last Year: Not on file  . Ran Out of Food in the Last Year: Not on file  Transportation Needs:   . Lack of Transportation (Medical): Not on file  . Lack of Transportation (Non-Medical): Not on file  Physical Activity:   . Days of Exercise per Week: Not on file  . Minutes of Exercise per  Session: Not on file  Stress:   . Feeling of Stress : Not on file  Social Connections:   . Frequency of Communication with Friends and Family: Not on file  . Frequency of Social Gatherings with Friends and Family: Not on file  . Attends Religious Services: Not on file  . Active Member of Clubs or Organizations: Not on file  . Attends Archivist Meetings: Not on file  . Marital Status: Not on file  Intimate Partner Violence:   . Fear of Current or Ex-Partner: Not on file  . Emotionally Abused: Not on file  . Physically Abused: Not on file  . Sexually Abused: Not on file    Family History  Problem Relation Age of Onset  . Diabetes Mother   . Heart disease  Father   . Stroke Father       Review of systems complete and found to be negative unless listed above      PHYSICAL EXAM  General: Well developed, well nourished, in no acute distress HEENT:  Normocephalic and atramatic Neck:  No JVD.  Lungs: Clear bilaterally to auscultation and percussion. Heart: Bradycardic, regular rhythm . Normal S1 and S2 without gallops or murmurs.  Abdomen: Bowel sounds are positive, abdomen soft and non-tender  Msk:  Back normal. Normal strength and tone for age. Extremities: No clubbing, cyanosis or edema.   Neuro: Alert and oriented X 3. Psych:  Good affect, responds appropriately  Labs:   Lab Results  Component Value Date   WBC 7.6 07/23/2020   HGB 13.5 07/23/2020   HCT 40.0 07/23/2020   MCV 90.1 07/23/2020   PLT 145 (L) 07/23/2020    Recent Labs  Lab 07/22/20 1334 07/22/20 1334 07/23/20 0436  NA 134*   < > 138  K 3.7   < > 3.9  CL 99   < > 99  CO2 19*   < > 29  BUN 24*   < > 20  CREATININE 1.56*   < > 1.39*  CALCIUM 10.2   < > 9.3  PROT 7.1  --   --   BILITOT 1.2  --   --   ALKPHOS 78  --   --   ALT 26  --   --   AST 25  --   --   GLUCOSE 264*   < > 145*   < > = values in this interval not displayed.   No results found for: CKTOTAL, CKMB, CKMBINDEX, TROPONINI  Lab Results  Component Value Date   CHOL 194 01/05/2020   CHOL 151 03/03/2019   CHOL 253 (H) 07/19/2018   Lab Results  Component Value Date   HDL 39 (L) 01/05/2020   HDL 29 (L) 07/19/2018   HDL 34 (L) 07/09/2015   Lab Results  Component Value Date   LDLCALC 106 (H) 01/05/2020   [place] Comment 07/19/2018   LDLCALC 88 07/09/2015   Lab Results  Component Value Date   TRIG 288 (H) 01/05/2020   TRIG 59 03/03/2019   TRIG 641 (HH) 07/19/2018   Lab Results  Component Value Date   CHOLHDL 8.7 (H) 07/19/2018   CHOLHDL 4.8 07/09/2015   No results found for: LDLDIRECT    Radiology: DG Chest 1 View  Result Date: 07/22/2020 CLINICAL DATA:  Syncope, fell  EXAM: CHEST  1 VIEW COMPARISON:  10/15/2016 FINDINGS: The heart size and mediastinal contours are within normal limits. Both lungs are clear. The visualized skeletal structures are unremarkable.  IMPRESSION: No active disease. Electronically Signed   By: Randa Ngo M.D.   On: 07/22/2020 15:17   CT Head Wo Contrast  Result Date: 07/22/2020 CLINICAL DATA:  74 year old male with history of syncopal episode. Fall with injury to the head and neck. EXAM: CT HEAD WITHOUT CONTRAST CT CERVICAL SPINE WITHOUT CONTRAST TECHNIQUE: Multidetector CT imaging of the head and cervical spine was performed following the standard protocol without intravenous contrast. Multiplanar CT image reconstructions of the cervical spine were also generated. COMPARISON:  No priors. FINDINGS: CT HEAD FINDINGS Brain: Patchy and confluent areas of decreased attenuation are noted throughout the deep and periventricular white matter of the cerebral hemispheres bilaterally, compatible with chronic microvascular ischemic disease. No evidence of acute infarction, hemorrhage, hydrocephalus, extra-axial collection or mass lesion/mass effect. Vascular: No hyperdense vessel or unexpected calcification. Skull: Normal. Negative for fracture or focal lesion. Sinuses/Orbits: No acute finding. Other: None. CT CERVICAL SPINE FINDINGS Alignment: Normal. Skull base and vertebrae: No acute fracture. No primary bone lesion or focal pathologic process. Soft tissues and spinal canal: No prevertebral fluid or swelling. No visible canal hematoma. Disc levels: No significant degenerative disc disease or facet arthropathy. Upper chest: Negative. Other: None. IMPRESSION: 1. No evidence of significant acute traumatic injury to the skull or brain. 2. Mild chronic microvascular ischemic changes in the cerebral white matter. Electronically Signed   By: Vinnie Langton M.D.   On: 07/22/2020 14:31   CT Cervical Spine Wo Contrast  Result Date: 07/22/2020 CLINICAL DATA:   74 year old male with history of syncopal episode. Fall with injury to the head and neck. EXAM: CT HEAD WITHOUT CONTRAST CT CERVICAL SPINE WITHOUT CONTRAST TECHNIQUE: Multidetector CT imaging of the head and cervical spine was performed following the standard protocol without intravenous contrast. Multiplanar CT image reconstructions of the cervical spine were also generated. COMPARISON:  No priors. FINDINGS: CT HEAD FINDINGS Brain: Patchy and confluent areas of decreased attenuation are noted throughout the deep and periventricular white matter of the cerebral hemispheres bilaterally, compatible with chronic microvascular ischemic disease. No evidence of acute infarction, hemorrhage, hydrocephalus, extra-axial collection or mass lesion/mass effect. Vascular: No hyperdense vessel or unexpected calcification. Skull: Normal. Negative for fracture or focal lesion. Sinuses/Orbits: No acute finding. Other: None. CT CERVICAL SPINE FINDINGS Alignment: Normal. Skull base and vertebrae: No acute fracture. No primary bone lesion or focal pathologic process. Soft tissues and spinal canal: No prevertebral fluid or swelling. No visible canal hematoma. Disc levels: No significant degenerative disc disease or facet arthropathy. Upper chest: Negative. Other: None. IMPRESSION: 1. No evidence of significant acute traumatic injury to the skull or brain. 2. Mild chronic microvascular ischemic changes in the cerebral white matter. Electronically Signed   By: Vinnie Langton M.D.   On: 07/22/2020 14:31    EKG: Sinus bradycardia, rate of 54bpm, with a short PR interval; no evidence of delta wave to indicate WPW; no evidence of acute ischemia   ASSESSMENT AND PLAN:  1.  Syncope  -Etiology unclear; ruled out for ACS with high sensitivity troponin negative x 2  -Would recommend monitoring on telemetry while admitted; will place 2 week Holter monitor prior to discharge   -Echocardiogram ordered, pending   2.  Diabetes  -Continue  SSI  3.  Hyperlipidemia  -Continue atorvastatin 40mg  daily   4.  Hypertension  -BP is stable; would continue home dose of amlodipine 5mg  daily   5.  Bradycardia   -Rate stable in the 50s; no evidence of AV dissociation   -  Will continue to monitor for marked bradycardia, prolonged pauses while admitted, and place 2 week Holter prior to discharge    The history, physical exam findings, and plan of care were all discussed with Dr. Bartholome Bill, and all decision making was made in collaboration.   Signed: Avie Arenas PA-C 07/23/2020, 8:51 AM

## 2020-07-23 NOTE — ED Notes (Signed)
Pt resting in bed with lights dimmed and call bell in reach. NAD noted at this time.  

## 2020-07-23 NOTE — TOC Initial Note (Signed)
Transition [facility]  [facility]) - Initial/Assessment Note    Patient Details  Name: Jack Estrada MRN: [id number] Date of Birth: [birth year]  Transition of Care [facility]) CM/SW Contact:    Shelbie Hutching, RN Phone Number: 07/23/2020, 3:59 PM  Clinical Narrative:                 Patient placed under observation for syncopal episode, patient fell and hit his head and is found to be very orthostatic.  Echo is pending and cardiology consult.  Patient is from home where he lives alone.  Patient's goddaughter is at the bedside this afternoon and she will be able to take him home at discharge.  Patient may benefit from home health at discharge, will wait for PT recommendations.   TOC team to cont to follow.   Expected Discharge Plan: [place] Barriers to Discharge: Continued Medical Work up   Patient Goals and CMS Choice Patient states their goals for this hospitalization and ongoing recovery are:: for the dizziness to go away CMS Medicare.gov Compare Post Acute Care list provided to:: Patient Choice offered to / list presented to : Patient  Expected Discharge Plan and Services Expected Discharge Plan: [place] Choice: [place] arrangements for the past 2 months: Single Family Home                                      Prior Living Arrangements/Services Living arrangements for the past 2 months: Single Family Home Lives with:: Self Patient language and need for interpreter reviewed:: Yes Do you feel safe going back to the place where you live?: Yes      Need for Family Participation in Patient Care: Yes (Comment) (orthostatic) Care giver support system in place?: Yes (comment) (goddaughter)   Criminal Activity/Legal Involvement Pertinent to Current Situation/Hospitalization: No - Comment as needed  Activities of Daily Living      Permission Sought/Granted Permission sought to share information with : Case Manager,  Family Supports Permission granted to share information with : Yes, Verbal Permission Granted     Permission granted to share info w AGENCY: home health agency of choice  Permission granted to share info w Relationship: Goddaughter     Emotional Assessment Appearance:: Appears stated age Attitude/Demeanor/Rapport: Engaged Affect (typically observed): Accepting Orientation: : Oriented to Self, Oriented to Place, Oriented to  Time, Oriented to Situation Alcohol / Substance Use: Not Applicable Psych Involvement: No (comment)  Admission diagnosis:  Syncope and collapse [R55] Dehydration [E86.0] Hypomagnesemia [E83.42] Bradycardia [R00.1] Syncope [R55] Hyperglycemia [R73.9] Laceration of scalp, initial encounter [S01.01XA] Patient Active Problem List   Diagnosis Date Noted  . Concussion 07/23/2020  . Syncope 07/22/2020  . Left hip pain 05/28/2020  . Penile cancer ([place]) 09/19/2019  . Anxiety 05/09/2019  . Depression, recurrent ([place]) 01/31/2019  . Osteoarthritis of carpometacarpal (CMC) joint of thumb 02/22/2018  . Hypertension associated with type 2 diabetes mellitus ([place]) 07/09/2015  . BPH (benign prostatic hyperplasia) 07/09/2015  . Insulin dependent type 2 diabetes mellitus ([place])   . Hyperlipidemia associated with type 2 diabetes mellitus ([place])    PCP:  Guadalupe Maple, MD Pharmacy:   [facility] [place], [place] AT [facility] OF [address] [place] [address] Phone: [phone] Fax:  [place], [place] - [address] [place] [address] Phone: [phone] Fax: [phone]     Social Determinants of Health (SDOH) Interventions    Readmission Risk Interventions No flowsheet data found.

## 2020-07-23 NOTE — Progress Notes (Addendum)
Inpatient Diabetes Program Recommendations  AACE/ADA: New Consensus Statement on Inpatient Glycemic Control (2015)  Target Ranges:  Prepandial:   less than 140 mg/dL      Peak postprandial:   less than 180 mg/dL (1-2 hours)      Critically ill patients:  140 - 180 mg/dL   Lab Results  Component Value Date   GLUCAP 153 (H) 07/23/2020   HGBA1C 10.4 (H) 07/23/2020    Review of Glycemic Control Results for Jack Estrada, Jack Estrada (MRN [id number]) as of 07/23/2020 11:40  Ref. Range 07/22/2020 14:55 07/22/2020 17:32 07/22/2020 21:18 07/23/2020 04:35 07/23/2020 11:33  Glucose-Capillary Latest Ref Range: 70 - 99 mg/dL 237 (H) 148 (H) 133 (H) 121 (H) 153 (H)   Diabetes history: DM1 Outpatient Diabetes medications: Lantus 30 units + Humalog 10 units tid meal coverage Current orders for Inpatient glycemic control: Lantus 30 units + Novolog moderate correction q 4 hrs.  Inpatient Diabetes Program Recommendations:    -While NPO: -Decrease Novolog correction to sensitive 0-9 units q 4 hrs. -Decrease Lantus to 24 units (80% home insulin dose) -Add Novolog 5 units tid if eats 50% when diet ordered Secure chat sent to Dr. Maylene Roes.  Spoke with patient regarding A1C results 10.4 (average blood glucose of 252 over the past 2-3 months timeand explained what an A1C is, basic pathophysiology of DM Type 1, basic home care, basic diabetes diet nutrition principles, importance of checking CBGs and maintaining good CBG control to prevent long-term and short-term complications. Reviewed signs and symptoms of hyperglycemia and hypoglycemia and how to treat hypoglycemia at home. Also reviewed blood sugar goals at home.  RNs to provide ongoing basic DM education at bedside with this patient.   Patient shared he was running low on insulin after his job was discontinued due to Covid and increased gas prices. 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 Patient has tried 70/30  insulin mix in the past but did not control his blood sugars very well. Reviewed with patient to have snacks with him that contain protein to keep blood sugars more stable. Patient states his meter strips are about $40 per refill and gave him information regarding [employer] @ [employer] where he can get meter + 100 strips for approximately $25. Patient agrees to followup with his physicians and start checking CBGs. Feels confident that he can afford the insulin and strips needed.  Thank you, Nani Gasser. Orvan Papadakis, RN, MSN, CDE  Diabetes Coordinator Inpatient Glycemic Control Team Team Pager [phone] (8am-5pm) 07/23/2020 12:08 PM

## 2020-07-23 NOTE — Progress Notes (Signed)
PROGRESS NOTE    Jack Estrada  NLZ:[id number] DOB: [birth year] DOA: 07/22/2020 PCP: Guadalupe Maple, MD     Brief Narrative:  Jack Estrada is a 74 y.o. male with medical history significant for hypertension, IDDM, depression/anxiety, history of penile condyloma acuminata initially thought as [facility] pathologist read as lichen planus) status post resection 10/19/19, hyperlipidemia, presented to the emergency department via EMS for chief concerns of syncopal episode.  New events last 24 hours / Subjective: Feeling well this morning. States that he felt slightly dizzy when getting out of the car, and then subsequently passed out inside pizza store, hitting his head. States this had happened [place] years [place], but did not seek care at the time. Denies CP, SOB.   Assessment & Plan:   Principal Problem:   Syncope Active Problems:   Insulin dependent type 2 diabetes mellitus (HCC)   Hyperlipidemia associated with type 2 diabetes mellitus ([place])   Hypertension associated with type 2 diabetes mellitus (HCC)   BPH (benign prostatic hyperplasia)   Depression, recurrent (HCC)   Anxiety   Concussion    Syncope -Patient with sinus bradycardia on telemetry and EKG -Troponin negative -Orthostatic vital signs positive 122/64 --> 77/46 with HR 75 --> 159 - hold norvasc  -Echocardiogram pending -Cardiology consulted -Plan for 2-week Holter monitor after discharge -Monitor on telemetry   AKI on CKD stage IIIa -Baseline creatinine 1.1 -IV fluid  Diabetes mellitus type 2 with hyperglycemia -Hemoglobin A1c 10.4 -Continue Lantus, NovoLog, sliding scale  Hyperlipidemia -Continue Lipitor  Depression/anxiety -Continue home Ativan, Abilify, fluoxetine  Posterior scalp laceration -Present on admission, supportive care   DVT prophylaxis:  enoxaparin (LOVENOX) injection 40 mg Start: 07/22/20 2200 Place TED hose Start: 07/22/20 1728  Code Status: Full code Family  Communication: No family at bedside Disposition Plan:  Status is: Observation  The patient will require care spanning > 2 midnights and should be moved to inpatient because: Hemodynamically unstable  Dispo: The patient is from: Home              Anticipated d/c is to: Home              Anticipated d/c date is: 1 day              Patient currently is not medically stable to d/c.  Positive orthostatic vital signs today. Need close monitoring for bradycardia with syncope. Echo pending. Await further cardiology recs.    Consultants:   Cardiology  Procedures:   None   Antimicrobials:  Anti-infectives (From admission, onward)   None        Objective: Vitals:   07/23/20 0530 07/23/20 0600 07/23/20 1044 07/23/20 1126  BP:  137/67 138/76 (!) 148/86  Pulse: (!) 51 (!) 58 70 (!) 57  Resp:  16 18 17   Temp:    98.4 F (36.9 C)  SpO2: 97% 98% 99% 99%  Weight:      Height:        Intake/Output Summary (Last 24 hours) at 07/23/2020 1417 Last data filed at 07/23/2020 0230 Gross per 24 hour  Intake 50 ml  Output 675 ml  Net -625 ml   Filed Weights   07/22/20 1331  Weight: 98.9 kg    Examination:  General exam: Appears calm and comfortable  Respiratory system: Clear to auscultation. Respiratory effort normal. No respiratory distress. No conversational dyspnea.  Cardiovascular system: S1 & S2 heard, bradycardic, regular rhythm. No murmurs. No  pedal edema. Gastrointestinal system: Abdomen is nondistended, soft and nontender. Normal bowel sounds heard. Central nervous system: Alert and oriented. No focal neurological deficits. Speech clear.  Extremities: Symmetric in appearance  Skin: No rashes, lesions or ulcers on exposed skin  Psychiatry: Judgement and insight appear normal. Mood & affect appropriate.   Data Reviewed: I have personally reviewed following labs and imaging studies  CBC: Recent Labs  Lab 07/22/20 1334 07/23/20 0436  WBC 7.9 7.6  NEUTROABS 3.8  --   HGB  13.7 13.5  HCT 39.4 40.0  MCV 87.4 90.1  PLT 149* 086*   Basic Metabolic Panel: Recent Labs  Lab 07/22/20 1334 07/23/20 0436  NA 134* 138  K 3.7 3.9  CL 99 99  CO2 19* 29  GLUCOSE 264* 145*  BUN 24* 20  CREATININE 1.56* 1.39*  CALCIUM 10.2 9.3  MG 1.6*  --    GFR: Estimated Creatinine Clearance: 58.5 mL/min (A) (by C-G formula based on SCr of 1.39 mg/dL (H)). Liver Function Tests: Recent Labs  Lab 07/22/20 1334  AST 25  ALT 26  ALKPHOS 78  BILITOT 1.2  PROT 7.1  ALBUMIN 3.7   No results for input(s): LIPASE, AMYLASE in the last 168 hours. No results for input(s): AMMONIA in the last 168 hours. Coagulation Profile: No results for input(s): INR, PROTIME in the last 168 hours. Cardiac Enzymes: No results for input(s): CKTOTAL, CKMB, CKMBINDEX, TROPONINI in the last 168 hours. BNP (last 3 results) No results for input(s): PROBNP in the last 8760 hours. HbA1C: Recent Labs    07/23/20 0436  HGBA1C 10.4*   CBG: Recent Labs  Lab 07/22/20 1455 07/22/20 1732 07/22/20 2118 07/23/20 0435 07/23/20 1133  GLUCAP 237* 148* 133* 121* 153*   Lipid Profile: No results for input(s): CHOL, HDL, LDLCALC, TRIG, CHOLHDL, LDLDIRECT in the last 72 hours. Thyroid Function Tests: Recent Labs    07/22/20 1334  TSH 2.945   Anemia Panel: No results for input(s): VITAMINB12, FOLATE, FERRITIN, TIBC, IRON, RETICCTPCT in the last 72 hours. Sepsis Labs: No results for input(s): PROCALCITON, LATICACIDVEN in the last 168 hours.  Recent Results (from the past 240 hour(s))  Respiratory Panel by RT PCR (Flu A&B, Covid) - Nasopharyngeal Swab     Status: None   Collection Time: 07/22/20  1:34 PM   Specimen: Nasopharyngeal Swab  Result Value Ref Range Status   SARS Coronavirus 2 by RT PCR NEGATIVE NEGATIVE Final    Comment: (NOTE) SARS-CoV-2 target nucleic acids are NOT DETECTED.  The SARS-CoV-2 RNA is generally detectable in upper respiratoy specimens during the acute phase of  infection. The lowest concentration of SARS-CoV-2 viral copies this assay can detect is 131 copies/mL. A negative result does not preclude SARS-Cov-2 infection and should not be used as the sole basis for treatment or other patient management decisions. A negative result may occur with  improper specimen collection/handling, submission of specimen other than nasopharyngeal swab, presence of viral mutation(s) within the areas targeted by this assay, and inadequate number of viral copies (<131 copies/mL). A negative result must be combined with clinical observations, patient history, and epidemiological information. The expected result is Negative.  Fact Sheet for Patients:  PinkCheek.be  Fact Sheet for Healthcare Providers:  GravelBags.it  This test is no t yet approved or cleared by the [place] FDA and  has been authorized for detection and/or diagnosis of SARS-CoV-2 by FDA under an Emergency Use Authorization (EUA). This EUA will remain  in effect (meaning this  test can be used) for the duration of the COVID-19 declaration under Section 564(b)(1) of the Act, 21 U.S.C. section 360bbb-3(b)(1), unless the authorization is terminated or revoked sooner.     Influenza A by PCR NEGATIVE NEGATIVE Final   Influenza B by PCR NEGATIVE NEGATIVE Final    Comment: (NOTE) The Xpert Xpress SARS-CoV-2/FLU/RSV assay is intended as an aid in  the diagnosis of influenza from Nasopharyngeal swab specimens and  should not be used as a sole basis for treatment. Nasal washings and  aspirates are unacceptable for Xpert Xpress SARS-CoV-2/FLU/RSV  testing.  Fact Sheet for Patients: PinkCheek.be  Fact Sheet for Healthcare Providers: GravelBags.it  This test is not yet approved or cleared by the [place] FDA and  has been authorized for detection and/or diagnosis of SARS-CoV-2  by  FDA under an Emergency Use Authorization (EUA). This EUA will remain  in effect (meaning this test can be used) for the duration of the  Covid-19 declaration under Section 564(b)(1) of the Act, 21  U.S.C. section 360bbb-3(b)(1), unless the authorization is  terminated or revoked. Performed at [facility], [address], [place], [place] [address]       Radiology Studies: DG Chest 1 View  Result Date: 07/22/2020 CLINICAL DATA:  Syncope, fell EXAM: CHEST  1 VIEW COMPARISON:  10/15/2016 FINDINGS: The heart size and mediastinal contours are within normal limits. Both lungs are clear. The visualized skeletal structures are unremarkable. IMPRESSION: No active disease. Electronically Signed   By: Randa Ngo M.D.   On: 07/22/2020 15:17   CT Head Wo Contrast  Result Date: 07/22/2020 CLINICAL DATA:  74 year old male with history of syncopal episode. Fall with injury to the head and neck. EXAM: CT HEAD WITHOUT CONTRAST CT CERVICAL SPINE WITHOUT CONTRAST TECHNIQUE: Multidetector CT imaging of the head and cervical spine was performed following the standard protocol without intravenous contrast. Multiplanar CT image reconstructions of the cervical spine were also generated. COMPARISON:  No priors. FINDINGS: CT HEAD FINDINGS Brain: Patchy and confluent areas of decreased attenuation are noted throughout the deep and periventricular white matter of the cerebral hemispheres bilaterally, compatible with chronic microvascular ischemic disease. No evidence of acute infarction, hemorrhage, hydrocephalus, extra-axial collection or mass lesion/mass effect. Vascular: No hyperdense vessel or unexpected calcification. Skull: Normal. Negative for fracture or focal lesion. Sinuses/Orbits: No acute finding. Other: None. CT CERVICAL SPINE FINDINGS Alignment: Normal. Skull base and vertebrae: No acute fracture. No primary bone lesion or focal pathologic process. Soft tissues and spinal canal: No  prevertebral fluid or swelling. No visible canal hematoma. Disc levels: No significant degenerative disc disease or facet arthropathy. Upper chest: Negative. Other: None. IMPRESSION: 1. No evidence of significant acute traumatic injury to the skull or brain. 2. Mild chronic microvascular ischemic changes in the cerebral white matter. Electronically Signed   By: Vinnie Langton M.D.   On: 07/22/2020 14:31   CT Cervical Spine Wo Contrast  Result Date: 07/22/2020 CLINICAL DATA:  74 year old male with history of syncopal episode. Fall with injury to the head and neck. EXAM: CT HEAD WITHOUT CONTRAST CT CERVICAL SPINE WITHOUT CONTRAST TECHNIQUE: Multidetector CT imaging of the head and cervical spine was performed following the standard protocol without intravenous contrast. Multiplanar CT image reconstructions of the cervical spine were also generated. COMPARISON:  No priors. FINDINGS: CT HEAD FINDINGS Brain: Patchy and confluent areas of decreased attenuation are noted throughout the deep and periventricular white matter of the cerebral hemispheres bilaterally, compatible with chronic microvascular ischemic disease.  No evidence of acute infarction, hemorrhage, hydrocephalus, extra-axial collection or mass lesion/mass effect. Vascular: No hyperdense vessel or unexpected calcification. Skull: Normal. Negative for fracture or focal lesion. Sinuses/Orbits: No acute finding. Other: None. CT CERVICAL SPINE FINDINGS Alignment: Normal. Skull base and vertebrae: No acute fracture. No primary bone lesion or focal pathologic process. Soft tissues and spinal canal: No prevertebral fluid or swelling. No visible canal hematoma. Disc levels: No significant degenerative disc disease or facet arthropathy. Upper chest: Negative. Other: None. IMPRESSION: 1. No evidence of significant acute traumatic injury to the skull or brain. 2. Mild chronic microvascular ischemic changes in the cerebral white matter. Electronically Signed   By:  Vinnie Langton M.D.   On: 07/22/2020 14:31      Scheduled Meds: . amLODipine  5 mg Oral Daily  . ARIPiprazole  5 mg Oral Daily  . atorvastatin  40 mg Oral Daily  . dicyclomine  10 mg Oral Once  . enoxaparin (LOVENOX) injection  40 mg Subcutaneous Q24H  . FLUoxetine  40 mg Oral Daily  . [START ON 07/24/2020] influenza vaccine adjuvanted  0.5 mL Intramuscular Tomorrow-1000  . insulin aspart  0-5 Units Subcutaneous QHS  . insulin aspart  0-9 Units Subcutaneous TID WC  . insulin aspart  5 Units Subcutaneous TID WC  . insulin glargine  30 Units Subcutaneous Daily  . sodium chloride flush  3 mL Intravenous Q12H   Continuous Infusions: . famotidine (PEPCID) IV Stopped (07/22/20 2231)     LOS: 0 days      Time spent: 35 minutes   Dessa Phi, DO [facility] 07/23/2020, 2:17 PM   Available via Epic secure chat 7am-7pm After these hours, please refer to coverage provider listed on [email or web address]

## 2020-07-23 NOTE — ED Notes (Signed)
Pt sleeping declined CBG or insulin

## 2020-07-23 NOTE — Evaluation (Signed)
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 Pt has prior history of syncope several years ago. Pt reports dizziness and nausea earlier in day and previous day. Pt full independent at baseline.  Clinical Impression   Pt admitted with above diagnosis. Pt currently with functional limitations due to the deficits listed below (see "PT Problem List"). Upon entry, pt in bed, awake and agreeable to participate. The pt is alert and oriented x4, pleasant, conversational, and generally a good historian. Pt performing all mobility with modified independence, however doe snot tolerate being upright for prolonged periods due to orthostatic BP. Pt less symptomatic this session with vitals, also appears to have less HR response to positional changes. Attempted to obtain standing BP after vigorous exercise, but BP even lower, pt asymptomatic: 78/35 mmHg. Functional mobility assessment demonstrates increased effort/time requirements, poor tolerance, and need for physical assistance, whereas the patient performed these at a higher level of independence PTA. Pt will benefit from skilled PT intervention to increase independence and safety with basic mobility in preparation for discharge to the venue listed below.     07/23/20 1603  Orthostatic Lying   BP- Lying 140/70  Pulse- Lying 64  Orthostatic Sitting  BP- Sitting 114/52 (mild dizziness)  Pulse- Sitting 64  Orthostatic Standing at 0 minutes  BP- Standing at 0 minutes (!) 83/62 (minimally symptomatic)  Pulse- Standing at 0 minutes 68     07/23/20 1300  Therapy Vitals  Patient Position (if appropriate) Orthostatic Vitals  Orthostatic Lying   BP- Lying 122/64  Pulse- Lying 75  Orthostatic Sitting  BP- Sitting 106/66  Pulse- Sitting 71  Orthostatic Standing at 0  minutes  BP- Standing at 0 minutes (!) 88/59  Pulse- Standing at 0 minutes 98  Orthostatic Standing at 3 minutes  BP- Standing at 3 minutes (!) 77/46  Pulse- Standing at 3 minutes 159         Follow Up Recommendations Supervision for mobility/OOB;Supervision - Intermittent;Home health PT    Equipment Recommendations  None recommended by PT    Recommendations for Other Services       Precautions / Restrictions Precautions Precautions: Fall Restrictions Weight Bearing Restrictions: No      Mobility  Bed Mobility Overal bed mobility: Independent                  Transfers Overall transfer level: Modified independent Equipment used: None             General transfer comment: 2 sets of 10 STS from EOB  Ambulation/Gait Ambulation/Gait assistance: Modified independent (Device/Increase time) Gait Distance (Feet): 5 Feet Assistive device: None          Stairs            Wheelchair Mobility    Modified Rankin (Stroke Patients Only)       Balance                                             Pertinent Vitals/Pain Pain Assessment: No/denies pain    Home Living Family/patient expects to be discharged to:: Private residence Living Arrangements: Alone Available Help at Discharge: Family Type of Home: House Home Access: Stairs to enter  Entrance Stairs-Number of Steps: 2 Home Layout: Able to live on main level with bedroom/bathroom;Two level Home Equipment: None      Prior Function Level of Independence: Independent               Hand Dominance        Extremity/Trunk Assessment   Upper Extremity Assessment Upper Extremity Assessment: Overall WFL for tasks assessed    Lower Extremity Assessment Lower Extremity Assessment: Overall WFL for tasks assessed       Communication      Cognition Arousal/Alertness: Awake/alert Behavior During Therapy: WFL for tasks assessed/performed Overall Cognitive  Status: Within Functional Limits for tasks assessed                                        General Comments      Exercises     Assessment/Plan    PT Assessment Patient needs continued PT services  PT Problem List Decreased activity tolerance;Decreased mobility;Cardiopulmonary status limiting activity       PT Treatment Interventions DME instruction;Therapeutic activities;Functional mobility training;Therapeutic exercise;Patient/family education    PT Goals (Current goals can be found in the Care Plan section)  Acute Rehab PT Goals Patient Stated Goal: return to PLOF activity PT Goal Formulation: With patient Time For Goal Achievement: 08/06/20 Potential to Achieve Goals: Good    Frequency Min 2X/week   Barriers to discharge        Co-evaluation               AM-PAC PT "6 Clicks" Mobility  Outcome Measure Help needed turning from your back to your side while in a flat bed without using bedrails?: None Help needed moving from lying on your back to sitting on the side of a flat bed without using bedrails?: None Help needed moving to and from a bed to a chair (including a wheelchair)?: None Help needed standing up from a chair using your arms (e.g., wheelchair or bedside chair)?: None Help needed to walk in hospital room?: Total Help needed climbing 3-5 steps with a railing? : Total 6 Click Score: 18    End of Session Equipment Utilized During Treatment: Gait belt Activity Tolerance: Treatment limited secondary to medical complications (Comment) Patient left: in chair;with call bell/phone within reach;Other (comment) (eating meal) Nurse Communication: Mobility status PT Visit Diagnosis: Other symptoms and signs involving the nervous system (R29.898);Dizziness and giddiness (R42)    Time: 7741-2878 PT Time Calculation (min) (ACUTE ONLY): 33 min   Charges:   PT Evaluation $PT Eval Low Complexity: 1 Low PT Treatments $Therapeutic Exercise:  8-22 mins        4:44 PM, 07/23/20 Etta Grandchild, PT, DPT Physical Therapist - [facility]  [phone] ([place])    Aaronmichael Brumbaugh C 07/23/2020, 4:40 PM

## 2020-07-23 NOTE — Care Management Obs Status (Signed)
[place]   Patient Details  Name: Jack Estrada MRN: [id number] Date of Birth: May 08, 1946   Medicare Observation Status Notification Given:  Yes    Shelbie Hutching, RN 07/23/2020, 2:51 PM

## 2020-07-23 NOTE — ED Notes (Signed)
Call to pt's cell phone because family had phone and come to STAT desk to say she wouldn't be returning to room tonight and to call pt's cell phone but no answer

## 2020-07-24 ENCOUNTER — Observation Stay: Payer: Medicare Other

## 2020-07-24 DIAGNOSIS — N179 Acute kidney failure, unspecified: Secondary | ICD-10-CM | POA: Diagnosis not present

## 2020-07-24 DIAGNOSIS — Z794 Long term (current) use of insulin: Secondary | ICD-10-CM | POA: Diagnosis not present

## 2020-07-24 DIAGNOSIS — N1831 Chronic kidney disease, stage 3a: Secondary | ICD-10-CM | POA: Diagnosis present

## 2020-07-24 DIAGNOSIS — Z833 Family history of diabetes mellitus: Secondary | ICD-10-CM | POA: Diagnosis not present

## 2020-07-24 DIAGNOSIS — S060X9A Concussion with loss of consciousness of unspecified duration, initial encounter: Secondary | ICD-10-CM | POA: Diagnosis not present

## 2020-07-24 DIAGNOSIS — N400 Enlarged prostate without lower urinary tract symptoms: Secondary | ICD-10-CM | POA: Diagnosis present

## 2020-07-24 DIAGNOSIS — I951 Orthostatic hypotension: Secondary | ICD-10-CM | POA: Diagnosis present

## 2020-07-24 DIAGNOSIS — E10319 Type 1 diabetes mellitus with unspecified diabetic retinopathy without macular edema: Secondary | ICD-10-CM | POA: Diagnosis present

## 2020-07-24 DIAGNOSIS — F419 Anxiety disorder, unspecified: Secondary | ICD-10-CM | POA: Diagnosis present

## 2020-07-24 DIAGNOSIS — E1069 Type 1 diabetes mellitus with other specified complication: Secondary | ICD-10-CM | POA: Diagnosis present

## 2020-07-24 DIAGNOSIS — R001 Bradycardia, unspecified: Secondary | ICD-10-CM | POA: Diagnosis not present

## 2020-07-24 DIAGNOSIS — G450 Vertebro-basilar artery syndrome: Secondary | ICD-10-CM | POA: Diagnosis present

## 2020-07-24 DIAGNOSIS — W19XXXA Unspecified fall, initial encounter: Secondary | ICD-10-CM | POA: Diagnosis present

## 2020-07-24 DIAGNOSIS — R42 Dizziness and giddiness: Secondary | ICD-10-CM | POA: Diagnosis not present

## 2020-07-24 DIAGNOSIS — E785 Hyperlipidemia, unspecified: Secondary | ICD-10-CM | POA: Diagnosis present

## 2020-07-24 DIAGNOSIS — R6884 Jaw pain: Secondary | ICD-10-CM | POA: Diagnosis present

## 2020-07-24 DIAGNOSIS — A630 Anogenital (venereal) warts: Secondary | ICD-10-CM | POA: Diagnosis present

## 2020-07-24 DIAGNOSIS — G919 Hydrocephalus, unspecified: Secondary | ICD-10-CM | POA: Diagnosis not present

## 2020-07-24 DIAGNOSIS — R519 Headache, unspecified: Secondary | ICD-10-CM | POA: Diagnosis not present

## 2020-07-24 DIAGNOSIS — E1169 Type 2 diabetes mellitus with other specified complication: Secondary | ICD-10-CM | POA: Diagnosis not present

## 2020-07-24 DIAGNOSIS — R55 Syncope and collapse: Secondary | ICD-10-CM | POA: Diagnosis not present

## 2020-07-24 DIAGNOSIS — E1159 Type 2 diabetes mellitus with other circulatory complications: Secondary | ICD-10-CM | POA: Diagnosis not present

## 2020-07-24 DIAGNOSIS — G9608 Other cranial cerebrospinal fluid leak: Secondary | ICD-10-CM | POA: Diagnosis present

## 2020-07-24 DIAGNOSIS — S0101XA Laceration without foreign body of scalp, initial encounter: Secondary | ICD-10-CM | POA: Diagnosis not present

## 2020-07-24 DIAGNOSIS — Z20822 Contact with and (suspected) exposure to covid-19: Secondary | ICD-10-CM | POA: Diagnosis present

## 2020-07-24 DIAGNOSIS — I129 Hypertensive chronic kidney disease with stage 1 through stage 4 chronic kidney disease, or unspecified chronic kidney disease: Secondary | ICD-10-CM | POA: Diagnosis present

## 2020-07-24 DIAGNOSIS — Z808 Family history of malignant neoplasm of other organs or systems: Secondary | ICD-10-CM | POA: Diagnosis not present

## 2020-07-24 DIAGNOSIS — Z888 Allergy status to other drugs, medicaments and biological substances status: Secondary | ICD-10-CM | POA: Diagnosis not present

## 2020-07-24 DIAGNOSIS — G458 Other transient cerebral ischemic attacks and related syndromes: Secondary | ICD-10-CM | POA: Diagnosis not present

## 2020-07-24 DIAGNOSIS — E119 Type 2 diabetes mellitus without complications: Secondary | ICD-10-CM | POA: Diagnosis not present

## 2020-07-24 DIAGNOSIS — N2889 Other specified disorders of kidney and ureter: Secondary | ICD-10-CM | POA: Diagnosis not present

## 2020-07-24 DIAGNOSIS — F32A Depression, unspecified: Secondary | ICD-10-CM | POA: Diagnosis present

## 2020-07-24 DIAGNOSIS — E1022 Type 1 diabetes mellitus with diabetic chronic kidney disease: Secondary | ICD-10-CM | POA: Diagnosis present

## 2020-07-24 DIAGNOSIS — Z8249 Family history of ischemic heart disease and other diseases of the circulatory system: Secondary | ICD-10-CM | POA: Diagnosis not present

## 2020-07-24 DIAGNOSIS — E1065 Type 1 diabetes mellitus with hyperglycemia: Secondary | ICD-10-CM | POA: Diagnosis present

## 2020-07-24 DIAGNOSIS — Z79899 Other long term (current) drug therapy: Secondary | ICD-10-CM | POA: Diagnosis not present

## 2020-07-24 LAB — ECHOCARDIOGRAM COMPLETE
Area-P 1/2: 2.23 cm2
Height: 72 in
S' Lateral: 2.91 cm
Weight: 3488 oz

## 2020-07-24 LAB — BASIC METABOLIC PANEL
Anion gap: 6 (ref 5–15)
BUN: 24 mg/dL — ABNORMAL HIGH (ref 8–23)
CO2: 30 mmol/L (ref 22–32)
Calcium: 9 mg/dL (ref 8.9–10.3)
Chloride: 102 mmol/L (ref 98–111)
Creatinine, Ser: 1.61 mg/dL — ABNORMAL HIGH (ref 0.61–1.24)
GFR, Estimated: 45 mL/min — ABNORMAL LOW (ref >60)
Glucose, Bld: 254 mg/dL — ABNORMAL HIGH (ref 70–99)
Potassium: 4.7 mmol/L (ref 3.5–5.1)
Sodium: 138 mmol/L (ref 135–145)

## 2020-07-24 LAB — GLUCOSE, CAPILLARY
Glucose-Capillary: 193 mg/dL — ABNORMAL HIGH (ref 70–99)
Glucose-Capillary: 224 mg/dL — ABNORMAL HIGH (ref 70–99)
Glucose-Capillary: 216 mg/dL — ABNORMAL HIGH (ref 70–99)
Glucose-Capillary: 171 mg/dL — ABNORMAL HIGH (ref 70–99)

## 2020-07-24 MED ORDER — ONDANSETRON HCL 4 MG/2ML IJ SOLN
4.0000 mg | Freq: Four times a day (QID) | INTRAMUSCULAR | Status: DC | PRN
  Administered 2020-07-27 – 2020-07-28 (×4): 4 mg via INTRAVENOUS
  Filled 2020-07-24 (×4): qty 2

## 2020-07-24 MED ORDER — MORPHINE SULFATE (PF) 2 MG/ML IV SOLN
1.0000 mg | INTRAVENOUS | Status: AC | PRN
  Administered 2020-07-24: 1 mg via INTRAVENOUS
  Filled 2020-07-24: qty 1

## 2020-07-24 MED ORDER — SODIUM CHLORIDE 0.9 % IV SOLN: INTRAVENOUS | Status: DC

## 2020-07-24 NOTE — Progress Notes (Signed)
Notified patient that he needs a low bed, patient refused

## 2020-07-24 NOTE — Progress Notes (Signed)
[facility]    SUBJECTIVE: [name] is a 74 year old male with a past medical history significant for type 1 diabetes, chronic kidney disease, hyperlipidemia, and hypertension who presented to the ED on 07/22/20 following a syncopal episode.  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  Workup in the ED was significant for blood glucose of 264, creatinine of 1.56, BUN of 24, GFR of 47, high sensitivity troponin negative x 2 at 7 and 6 respectively, BNP of 29, magnesium of 1.6, head CT negative for acute traumatic injury with mild, chronic microvascular ischemic changes, chest xray negative for acute cardiopulmonary disease, and ECG revealing sinus bradycardia at a ventricular rate of 54bpm with a short PR interval, no evidence of a delta wave.   07/23/20: [name] is lying in bed, in no acute distress. Following his syncopal episode, he experienced nausea, vomiting, and severe dizziness, which have all improved.  Prior to the syncopal episode, he admits to mild dizziness, but denies chest pain, palpitations, or heart racing.  He reports feeling in normal health prior to the episode and had eaten an hour before.  He denies recent illness or sick contacts.  He admits to one prior syncopal episode, occurring a few years ago, in the setting of dehydration.    07/24/20: Today, [name] woke up with severe dizziness with an associated headache.  Orthostatic vital signs were positive yesterday, amlodipine being held.  Continues to deny chest pain, chest pressure, palpitations, shortness of breath, orthopnea, or PND.    Vitals:   07/23/20 2039 07/24/20 0029 07/24/20 0500 07/24/20 0520  BP: 138/90 111/62  134/70  Pulse: 61 (!) 56  (!) 52  Resp: 20 14  18   Temp: 98.4 F (36.9 C) 97.7 F (36.5 C)  (!) 97.5 F (36.4 C)  TempSrc: Oral     SpO2: 97% 98%  97%  Weight:   98.8 kg   Height:         Intake/Output Summary (Last 24  hours) at 07/24/2020 0734 Last data filed at 07/23/2020 1428 Gross per 24 hour  Intake 240 ml  Output --  Net 240 ml      PHYSICAL EXAM  General: Well developed, well nourished, in no acute distress HEENT:  Normocephalic and atramatic Neck:  No JVD.  Lungs: Clear bilaterally to auscultation and percussion. Heart: HRRR . Normal S1 and S2 without gallops or murmurs.  Abdomen: Bowel sounds are positive, abdomen soft and non-tender  Msk:  Back normal.  Normal strength and tone for age. Extremities: No clubbing, cyanosis or edema.   Neuro: Alert and oriented X 3. Psych:  Good affect, responds appropriately   LABS: Basic Metabolic Panel: Recent Labs    07/22/20 1334 07/22/20 1334 07/23/20 0436 07/24/20 0441  NA 134*   < > 138 138  K 3.7   < > 3.9 4.7  CL 99   < > 99 102  CO2 19*   < > 29 30  GLUCOSE 264*   < > 145* 254*  BUN 24*   < > 20 24*  CREATININE 1.56*   < > 1.39* 1.61*  CALCIUM 10.2   < > 9.3 9.0  MG 1.6*  --   --   --    < > = values in this interval not displayed.   Liver Function Tests: Recent Labs    07/22/20 1334  AST 25  ALT 26  ALKPHOS 78  BILITOT 1.2  PROT 7.1  ALBUMIN 3.7   No results for input(s): LIPASE, AMYLASE in the last 72 hours. CBC: Recent Labs    07/22/20 1334 07/23/20 0436  WBC 7.9 7.6  NEUTROABS 3.8  --   HGB 13.7 13.5  HCT 39.4 40.0  MCV 87.4 90.1  PLT 149* 145*   Cardiac Enzymes: No results for input(s): CKTOTAL, CKMB, CKMBINDEX, TROPONINI in the last 72 hours. BNP: Invalid input(s): POCBNP D-Dimer: No results for input(s): DDIMER in the last 72 hours. Hemoglobin A1C: Recent Labs    07/23/20 0436  HGBA1C 10.4*   Fasting Lipid Panel: No results for input(s): CHOL, HDL, LDLCALC, TRIG, CHOLHDL, LDLDIRECT in the last 72 hours. Thyroid Function Tests: Recent Labs    07/22/20 1334  TSH 2.945   Anemia Panel: No results for input(s): VITAMINB12, FOLATE, FERRITIN, TIBC, IRON, RETICCTPCT in the last 72 hours.  DG  Chest 1 View  Result Date: 07/22/2020 CLINICAL DATA:  Syncope, fell EXAM: CHEST  1 VIEW COMPARISON:  10/15/2016 FINDINGS: The heart size and mediastinal contours are within normal limits. Both lungs are clear. The visualized skeletal structures are unremarkable. IMPRESSION: No active disease. Electronically Signed   By: Randa Ngo M.D.   On: 07/22/2020 15:17   CT Head Wo Contrast  Result Date: 07/22/2020 CLINICAL DATA:  74 year old male with history of syncopal episode. Fall with injury to the head and neck. EXAM: CT HEAD WITHOUT CONTRAST CT CERVICAL SPINE WITHOUT CONTRAST TECHNIQUE: Multidetector CT imaging of the head and cervical spine was performed following the standard protocol without intravenous contrast. Multiplanar CT image reconstructions of the cervical spine were also generated. COMPARISON:  No priors. FINDINGS: CT HEAD FINDINGS Brain: Patchy and confluent areas of decreased attenuation are noted throughout the deep and periventricular white matter of the cerebral hemispheres bilaterally, compatible with chronic microvascular ischemic disease. No evidence of acute infarction, hemorrhage, hydrocephalus, extra-axial collection or mass lesion/mass effect. Vascular: No hyperdense vessel or unexpected calcification. Skull: Normal. Negative for fracture or focal lesion. Sinuses/Orbits: No acute finding. Other: None. CT CERVICAL SPINE FINDINGS Alignment: Normal. Skull base and vertebrae: No acute fracture. No primary bone lesion or focal pathologic process. Soft tissues and spinal canal: No prevertebral fluid or swelling. No visible canal hematoma. Disc levels: No significant degenerative disc disease or facet arthropathy. Upper chest: Negative. Other: None. IMPRESSION: 1. No evidence of significant acute traumatic injury to the skull or brain. 2. Mild chronic microvascular ischemic changes in the cerebral white matter. Electronically Signed   By: Vinnie Langton M.D.   On: 07/22/2020 14:31   CT  Cervical Spine Wo Contrast  Result Date: 07/22/2020 CLINICAL DATA:  74 year old male with history of syncopal episode. Fall with injury to the head and neck. EXAM: CT HEAD WITHOUT CONTRAST CT CERVICAL SPINE WITHOUT CONTRAST TECHNIQUE: Multidetector CT imaging of the head and cervical spine was performed following the standard protocol without intravenous contrast. Multiplanar CT image reconstructions of the cervical spine were also generated. COMPARISON:  No priors. FINDINGS: CT HEAD FINDINGS Brain: Patchy and confluent areas of decreased attenuation are noted throughout the deep and periventricular white matter of the cerebral hemispheres bilaterally, compatible with chronic microvascular ischemic disease. No evidence of acute infarction, hemorrhage, hydrocephalus, extra-axial collection or mass lesion/mass effect. Vascular: No hyperdense vessel or unexpected calcification. Skull: Normal. Negative for fracture or focal lesion. Sinuses/Orbits: No acute finding. Other: None. CT CERVICAL SPINE FINDINGS Alignment: Normal. Skull base and vertebrae: No acute  fracture. No primary bone lesion or focal pathologic process. Soft tissues and spinal canal: No prevertebral fluid or swelling. No visible canal hematoma. Disc levels: No significant degenerative disc disease or facet arthropathy. Upper chest: Negative. Other: None. IMPRESSION: 1. No evidence of significant acute traumatic injury to the skull or brain. 2. Mild chronic microvascular ischemic changes in the cerebral white matter. Electronically Signed   By: Vinnie Langton M.D.   On: 07/22/2020 14:31   ECHOCARDIOGRAM COMPLETE  Result Date: 07/24/2020    ECHOCARDIOGRAM REPORT   Patient Name:   Jack Estrada Date of Exam: 07/23/2020 Medical Rec #:  [id number]     Height:       72.0 in Accession #:    [id number]    Weight:       218.0 lb Date of Birth:  Nov 21, 1945      BSA:          2.210 m Patient Age:    6 years      BP:           148/86 mmHg Patient Gender: M              HR:           64 bpm. Exam Location:  [facility] Procedure: 2D Echo Indications:     Syncope 780.2 / R55  History:         Patient has no prior history of Echocardiogram examinations.                  Risk Factors:Hypertension, Diabetes and Dyslipidemia.  Sonographer:     Avanell Shackleton Referring Phys:  [id number] JENNIFER CHOI Diagnosing Phys: Nelva Bush MD IMPRESSIONS  1. Left ventricular ejection fraction, by estimation, is 55 to 60%. The left ventricle has normal function. Left ventricular endocardial border not optimally defined to evaluate regional wall motion. There is mild left ventricular hypertrophy. Left ventricular diastolic parameters are consistent with Grade I diastolic dysfunction (impaired relaxation).  2. Right ventricular systolic function is normal. The right ventricular size is mildly enlarged. Tricuspid regurgitation signal is inadequate for assessing PA pressure.  3. The mitral valve was not well visualized. Trivial mitral valve regurgitation.  4. The aortic valve was not well visualized. There is moderate calcification of the aortic valve. There is moderate thickening of the aortic valve. Aortic valve regurgitation is trivial. Mild to moderate aortic valve sclerosis/calcification is present, without any evidence of aortic stenosis.  5. Aortic dilatation noted. There is mild dilatation of the aortic root, measuring 39 mm. FINDINGS  Left Ventricle: Left ventricular ejection fraction, by estimation, is 55 to 60%. The left ventricle has normal function. Left ventricular endocardial border not optimally defined to evaluate regional wall motion. The left ventricular internal cavity size was normal in size. There is mild left ventricular hypertrophy. Left ventricular diastolic parameters are consistent with Grade I diastolic dysfunction (impaired relaxation). Right Ventricle: The right ventricular size is mildly enlarged. Right vetricular wall thickness was not well visualized. Right ventricular  systolic function is normal. Tricuspid regurgitation signal is inadequate for assessing PA pressure. Left Atrium: Left atrial size was normal in size. Right Atrium: Right atrial size was normal in size. Pericardium: There is no evidence of pericardial effusion. Mitral Valve: The mitral valve was not well visualized. Trivial mitral valve regurgitation. Tricuspid Valve: The tricuspid valve is not well visualized. Tricuspid valve regurgitation is trivial. Aortic Valve: The aortic valve was not well visualized. There is moderate calcification of the aortic  valve. There is moderate thickening of the aortic valve. Aortic valve regurgitation is trivial. Mild to moderate aortic valve sclerosis/calcification is  present, without any evidence of aortic stenosis. Pulmonic Valve: The pulmonic valve was not well visualized. Pulmonic valve regurgitation is not visualized. No evidence of pulmonic stenosis. Aorta: Aortic dilatation noted. There is mild dilatation of the aortic root, measuring 39 mm. Pulmonary Artery: The pulmonary artery is of normal size. Venous: The inferior vena cava was not well visualized. IAS/Shunts: The interatrial septum was not well visualized.  LEFT VENTRICLE PLAX 2D LVIDd:         4.04 cm  Diastology LVIDs:         2.91 cm  LV e' medial:    5.66 cm/s LV PW:         1.04 cm  LV E/e' medial:  11.2 LV IVS:        1.18 cm  LV e' lateral:   7.94 cm/s LVOT diam:     2.10 cm  LV E/e' lateral: 8.0 LVOT Area:     3.46 cm  RIGHT VENTRICLE             IVC RV S prime:     18.40 cm/s  IVC diam: 2.18 cm TAPSE (M-mode): 2.7 cm LEFT ATRIUM             Index       RIGHT ATRIUM           Index LA diam:        4.60 cm 2.08 cm/m  RA Area:     12.60 cm LA Vol (A2C):   40.6 ml 18.37 ml/m RA Volume:   26.50 ml  11.99 ml/m LA Vol (A4C):   48.0 ml 21.72 ml/m LA Biplane Vol: 44.8 ml 20.27 ml/m   AORTA Ao Root diam: 3.90 cm MITRAL VALVE MV Area (PHT): 2.23 cm    SHUNTS MV Decel Time: 340 msec    Systemic Diam: 2.10 cm MV E  velocity: 63.40 cm/s MV A velocity: 84.40 cm/s MV E/A ratio:  0.75 Christopher End MD Electronically signed by Nelva Bush MD Signature Date/Time: 07/24/2020/6:38:12 AM    Final     Echo: Normal RV and LV systolic function with an EF estimated between 55-60% with mild LVH, grade 1 diastolic dysfunction. Mild to moderate aortic valve sclerosis without evidence of stenosis. Mild aortic root dilatation, measuring 31mm   TELEMETRY: Sinus bradycardia, rates fluctuating in the upper 40s to low 60s   ASSESSMENT AND PLAN:  Principal Problem:   Syncope Active Problems:   Insulin dependent type 2 diabetes mellitus (HCC)   Hyperlipidemia associated with type 2 diabetes mellitus ([place])   Hypertension associated with type 2 diabetes mellitus (HCC)   BPH (benign prostatic hyperplasia)   Depression, recurrent ([place])   Anxiety   Concussion    1.  Syncope             -Etiology may be due to underlying orthostatic hypotension; amlodipine currently being held   -Continue IV fluids   -Echocardiogram this admission revealing normal LV systolic function with no evidence of wall motion abnormalities or significant valvular disease             -Will place 2 week Holter monitor prior to discharge            2.  Diabetes             -Continue SSI  3.  Hyperlipidemia             -  Continue atorvastatin 40mg  daily   4.  Hypertension             -Orthostatic hypotension noted; amlodipine currently being held   5.  Bradycardia              -Rate fluctuating in the upper 40s to low 60s             -Will continue to monitor for marked bradycardia, prolonged pauses while admitted, and place 2 week Holter prior to discharge   6.  Concussion   -I suspect dizziness is due to his concussion; however, orthostatic component likely as well   The history, physical exam findings, and plan of care were all discussed with Dr. Bartholome Bill, and all decision making was made in collaboration.   Avie Arenas   PA-C 07/24/2020 7:34 AM

## 2020-07-24 NOTE — Progress Notes (Signed)
Consult placed for I.V. start. Patient refused. R.N. caring for patient notified.

## 2020-07-24 NOTE — Progress Notes (Signed)
PROGRESS NOTE    Jack Estrada  UJW:[id number] DOB: [birth year] DOA: 07/22/2020 PCP: Guadalupe Maple, MD     Brief Narrative:  Jack Estrada is a 74 y.o. male with medical history significant for hypertension, IDDM, depression/anxiety, history of penile condyloma acuminata initially thought as [facility] pathologist read as lichen planus) status post resection 10/19/19, hyperlipidemia, presented to the emergency department via EMS for chief concerns of syncopal episode.  New events last 24 hours / Subjective: Patient is not feeling well this morning, complains of neck pain and nausea.  Assessment & Plan:   Principal Problem:   Syncope Active Problems:   Insulin dependent type 2 diabetes mellitus (HCC)   Hyperlipidemia associated with type 2 diabetes mellitus ([place])   Hypertension associated with type 2 diabetes mellitus (HCC)   BPH (benign prostatic hyperplasia)   Depression, recurrent (HCC)   Anxiety   Concussion    Syncope -Patient with sinus bradycardia on telemetry and EKG -Troponin negative -Orthostatic vital signs positive 122/64 --> 77/46 with HR 75 --> 159 - hold norvasc.  Repeat orthostatic vital sign this morning -Echocardiogram unremarkable -Cardiology following -Plan for 2-week Holter monitor after discharge -Monitor on telemetry   AKI on CKD stage IIIa -Baseline creatinine 1.1 -IV fluid (this was ordered on 11/8 but was not started.  Discussed with RN to start IV fluid today) -Trend BMP -Renal ultrasound unremarkable.  Patient admits to adequate urine output.  Ordered strict I's and O's  Diabetes mellitus type 2 with hyperglycemia -Hemoglobin A1c 10.4 -Continue Lantus, NovoLog, sliding scale  Hyperlipidemia -Continue Lipitor  Depression/anxiety -Continue home Ativan, Abilify, fluoxetine  Posterior scalp laceration -Present on admission, supportive care  Likely concussion after syncopal episode -Supportive care   DVT prophylaxis:  enoxaparin  (LOVENOX) injection 40 mg Start: 07/22/20 2200 Place TED hose Start: 07/22/20 1728  Code Status: Full code Family Communication: Family at bedside Disposition Plan:  Status is: Observation  The patient will require care spanning > 2 midnights and should be moved to inpatient because: Hemodynamically unstable  Dispo: The patient is from: Home              Anticipated d/c is to: Home              Anticipated d/c date is: 1 day              Patient currently is not medically stable to d/c.  IV fluid due to worsening creatinine.  Symptom management of nausea and dizziness, orthostatic hypotension.  Renal ultrasound ordered today.   Consultants:   Cardiology  Procedures:   None   Antimicrobials:  Anti-infectives (From admission, onward)   None       Objective: Vitals:   07/24/20 0029 07/24/20 0500 07/24/20 0520 07/24/20 0735  BP: 111/62  134/70 (!) 162/80  Pulse: (!) 56  (!) 52 (!) 48  Resp: 14  18 18   Temp: 97.7 F (36.5 C)  (!) 97.5 F (36.4 C) 98 F (36.7 C)  TempSrc:    Oral  SpO2: 98%  97% 97%  Weight:  98.8 kg    Height:        Intake/Output Summary (Last 24 hours) at 07/24/2020 0916 Last data filed at 07/23/2020 1428 Gross per 24 hour  Intake 240 ml  Output --  Net 240 ml   Filed Weights   07/22/20 1331 07/24/20 0500  Weight: 98.9 kg 98.8 kg    Examination: General exam: Appears calm  Respiratory system: Clear  to auscultation. Respiratory effort normal. Cardiovascular system: S1 & S2 heard, bradycardic rate, regular rhythm. No pedal edema. Gastrointestinal system: Abdomen is nondistended, soft and nontender. Normal bowel sounds heard. Central nervous system: Alert. Non focal exam. Speech clear  Extremities: Symmetric in appearance bilaterally  Skin: No rashes, lesions or ulcers on exposed skin  Psychiatry: Judgement and insight appear stable. Mood & affect appropriate.   Data Reviewed: I have personally reviewed following labs and imaging  studies  CBC: Recent Labs  Lab 07/22/20 1334 07/23/20 0436  WBC 7.9 7.6  NEUTROABS 3.8  --   HGB 13.7 13.5  HCT 39.4 40.0  MCV 87.4 90.1  PLT 149* 767*   Basic Metabolic Panel: Recent Labs  Lab 07/22/20 1334 07/23/20 0436 07/24/20 0441  NA 134* 138 138  K 3.7 3.9 4.7  CL 99 99 102  CO2 19* 29 30  GLUCOSE 264* 145* 254*  BUN 24* 20 24*  CREATININE 1.56* 1.39* 1.61*  CALCIUM 10.2 9.3 9.0  MG 1.6*  --   --    GFR: Estimated Creatinine Clearance: 50.5 mL/min (A) (by C-G formula based on SCr of 1.61 mg/dL (H)). Liver Function Tests: Recent Labs  Lab 07/22/20 1334  AST 25  ALT 26  ALKPHOS 78  BILITOT 1.2  PROT 7.1  ALBUMIN 3.7   No results for input(s): LIPASE, AMYLASE in the last 168 hours. No results for input(s): AMMONIA in the last 168 hours. Coagulation Profile: No results for input(s): INR, PROTIME in the last 168 hours. Cardiac Enzymes: No results for input(s): CKTOTAL, CKMB, CKMBINDEX, TROPONINI in the last 168 hours. BNP (last 3 results) No results for input(s): PROBNP in the last 8760 hours. HbA1C: Recent Labs    07/23/20 0436  HGBA1C 10.4*   CBG: Recent Labs  Lab 07/22/20 2118 07/23/20 0435 07/23/20 1133 07/23/20 2115 07/24/20 0736  GLUCAP 133* 121* 153* 304* 193*   Lipid Profile: No results for input(s): CHOL, HDL, LDLCALC, TRIG, CHOLHDL, LDLDIRECT in the last 72 hours. Thyroid Function Tests: Recent Labs    07/22/20 1334  TSH 2.945   Anemia Panel: No results for input(s): VITAMINB12, FOLATE, FERRITIN, TIBC, IRON, RETICCTPCT in the last 72 hours. Sepsis Labs: No results for input(s): PROCALCITON, LATICACIDVEN in the last 168 hours.  Recent Results (from the past 240 hour(s))  Respiratory Panel by RT PCR (Flu A&B, Covid) - Nasopharyngeal Swab     Status: None   Collection Time: 07/22/20  1:34 PM   Specimen: Nasopharyngeal Swab  Result Value Ref Range Status   SARS Coronavirus 2 by RT PCR NEGATIVE NEGATIVE Final    Comment:  (NOTE) SARS-CoV-2 target nucleic acids are NOT DETECTED.  The SARS-CoV-2 RNA is generally detectable in upper respiratoy specimens during the acute phase of infection. The lowest concentration of SARS-CoV-2 viral copies this assay can detect is 131 copies/mL. A negative result does not preclude SARS-Cov-2 infection and should not be used as the sole basis for treatment or other patient management decisions. A negative result may occur with  improper specimen collection/handling, submission of specimen other than nasopharyngeal swab, presence of viral mutation(s) within the areas targeted by this assay, and inadequate number of viral copies (<131 copies/mL). A negative result must be combined with clinical observations, patient history, and epidemiological information. The expected result is Negative.  Fact Sheet for Patients:  PinkCheek.be  Fact Sheet for Healthcare Providers:  GravelBags.it  This test is no t yet approved or cleared by the [place] and  has been authorized for detection and/or diagnosis of SARS-CoV-2 by FDA under an Emergency Use Authorization (EUA). This EUA will remain  in effect (meaning this test can be used) for the duration of the COVID-19 declaration under Section 564(b)(1) of the Act, 21 U.S.C. section 360bbb-3(b)(1), unless the authorization is terminated or revoked sooner.     Influenza A by PCR NEGATIVE NEGATIVE Final   Influenza B by PCR NEGATIVE NEGATIVE Final    Comment: (NOTE) The Xpert Xpress SARS-CoV-2/FLU/RSV assay is intended as an aid in  the diagnosis of influenza from Nasopharyngeal swab specimens and  should not be used as a sole basis for treatment. Nasal washings and  aspirates are unacceptable for Xpert Xpress SARS-CoV-2/FLU/RSV  testing.  Fact Sheet for Patients: PinkCheek.be  Fact Sheet for Healthcare  Providers: GravelBags.it  This test is not yet approved or cleared by the [place] FDA and  has been authorized for detection and/or diagnosis of SARS-CoV-2 by  FDA under an Emergency Use Authorization (EUA). This EUA will remain  in effect (meaning this test can be used) for the duration of the  Covid-19 declaration under Section 564(b)(1) of the Act, 21  U.S.C. section 360bbb-3(b)(1), unless the authorization is  terminated or revoked. Performed at [facility], [address], [place], [place] [address]       Radiology Studies: DG Chest 1 View  Result Date: 07/22/2020 CLINICAL DATA:  Syncope, fell EXAM: CHEST  1 VIEW COMPARISON:  10/15/2016 FINDINGS: The heart size and mediastinal contours are within normal limits. Both lungs are clear. The visualized skeletal structures are unremarkable. IMPRESSION: No active disease. Electronically Signed   By: Randa Ngo M.D.   On: 07/22/2020 15:17   CT Head Wo Contrast  Result Date: 07/22/2020 CLINICAL DATA:  74 year old male with history of syncopal episode. Fall with injury to the head and neck. EXAM: CT HEAD WITHOUT CONTRAST CT CERVICAL SPINE WITHOUT CONTRAST TECHNIQUE: Multidetector CT imaging of the head and cervical spine was performed following the standard protocol without intravenous contrast. Multiplanar CT image reconstructions of the cervical spine were also generated. COMPARISON:  No priors. FINDINGS: CT HEAD FINDINGS Brain: Patchy and confluent areas of decreased attenuation are noted throughout the deep and periventricular white matter of the cerebral hemispheres bilaterally, compatible with chronic microvascular ischemic disease. No evidence of acute infarction, hemorrhage, hydrocephalus, extra-axial collection or mass lesion/mass effect. Vascular: No hyperdense vessel or unexpected calcification. Skull: Normal. Negative for fracture or focal lesion. Sinuses/Orbits: No acute finding. Other:  None. CT CERVICAL SPINE FINDINGS Alignment: Normal. Skull base and vertebrae: No acute fracture. No primary bone lesion or focal pathologic process. Soft tissues and spinal canal: No prevertebral fluid or swelling. No visible canal hematoma. Disc levels: No significant degenerative disc disease or facet arthropathy. Upper chest: Negative. Other: None. IMPRESSION: 1. No evidence of significant acute traumatic injury to the skull or brain. 2. Mild chronic microvascular ischemic changes in the cerebral white matter. Electronically Signed   By: Vinnie Langton M.D.   On: 07/22/2020 14:31   CT Cervical Spine Wo Contrast  Result Date: 07/22/2020 CLINICAL DATA:  74 year old male with history of syncopal episode. Fall with injury to the head and neck. EXAM: CT HEAD WITHOUT CONTRAST CT CERVICAL SPINE WITHOUT CONTRAST TECHNIQUE: Multidetector CT imaging of the head and cervical spine was performed following the standard protocol without intravenous contrast. Multiplanar CT image reconstructions of the cervical spine were also generated. COMPARISON:  No priors. FINDINGS: CT HEAD FINDINGS Brain: Patchy  and confluent areas of decreased attenuation are noted throughout the deep and periventricular white matter of the cerebral hemispheres bilaterally, compatible with chronic microvascular ischemic disease. No evidence of acute infarction, hemorrhage, hydrocephalus, extra-axial collection or mass lesion/mass effect. Vascular: No hyperdense vessel or unexpected calcification. Skull: Normal. Negative for fracture or focal lesion. Sinuses/Orbits: No acute finding. Other: None. CT CERVICAL SPINE FINDINGS Alignment: Normal. Skull base and vertebrae: No acute fracture. No primary bone lesion or focal pathologic process. Soft tissues and spinal canal: No prevertebral fluid or swelling. No visible canal hematoma. Disc levels: No significant degenerative disc disease or facet arthropathy. Upper chest: Negative. Other: None. IMPRESSION:  1. No evidence of significant acute traumatic injury to the skull or brain. 2. Mild chronic microvascular ischemic changes in the cerebral white matter. Electronically Signed   By: Vinnie Langton M.D.   On: 07/22/2020 14:31   US RENAL  Result Date: 07/24/2020 CLINICAL DATA:  Acute renal insufficiency EXAM: RENAL / URINARY TRACT ULTRASOUND COMPLETE COMPARISON:  CT scan 09/01/2019 FINDINGS: Right Kidney: Renal measurements: 11.3 x 5.5 x 5.1 cm = volume: 163 mL. Normal renal cortical thickness and echogenicity. Mild cortical scarring type changes are noted and appears stable since the prior CT scan. No worrisome renal lesions or hydronephrosis. Left Kidney: Renal measurements: 12.0 x 5.1 x 3.5 cm = volume: Inter 175 mL. Normal renal cortical thickness and echogenicity. Renal cortical scarring changes are noted. No worrisome renal lesions or hydronephrosis. Bladder: Appears normal for degree of bladder distention. Bilateral ureteral jets are noted. Other: None. IMPRESSION: 1. Renal cortical scarring changes bilaterally but no worrisome renal lesions or hydronephrosis. 2. Normal bladder. Electronically Signed   By: Marijo Sanes M.D.   On: 07/24/2020 08:40   ECHOCARDIOGRAM COMPLETE  Result Date: 07/24/2020    ECHOCARDIOGRAM REPORT   Patient Name:   GORMAN SAFI Date of Exam: 07/23/2020 Medical Rec #:  [id number]     Height:       72.0 in Accession #:    [id number]    Weight:       218.0 lb Date of Birth:  1945/11/27      BSA:          2.210 m Patient Age:    10 years      BP:           148/86 mmHg Patient Gender: M             HR:           64 bpm. Exam Location:  [facility] Procedure: 2D Echo Indications:     Syncope 780.2 / R55  History:         Patient has no prior history of Echocardiogram examinations.                  Risk Factors:Hypertension, Diabetes and Dyslipidemia.  Sonographer:     Avanell Shackleton Referring Phys:  [id number] Raechal Raben Diagnosing Phys: Nelva Bush MD IMPRESSIONS  1. Left ventricular  ejection fraction, by estimation, is 55 to 60%. The left ventricle has normal function. Left ventricular endocardial border not optimally defined to evaluate regional wall motion. There is mild left ventricular hypertrophy. Left ventricular diastolic parameters are consistent with Grade I diastolic dysfunction (impaired relaxation).  2. Right ventricular systolic function is normal. The right ventricular size is mildly enlarged. Tricuspid regurgitation signal is inadequate for assessing PA pressure.  3. The mitral valve was not well visualized. Trivial mitral valve regurgitation.  4. The  aortic valve was not well visualized. There is moderate calcification of the aortic valve. There is moderate thickening of the aortic valve. Aortic valve regurgitation is trivial. Mild to moderate aortic valve sclerosis/calcification is present, without any evidence of aortic stenosis.  5. Aortic dilatation noted. There is mild dilatation of the aortic root, measuring 39 mm. FINDINGS  Left Ventricle: Left ventricular ejection fraction, by estimation, is 55 to 60%. The left ventricle has normal function. Left ventricular endocardial border not optimally defined to evaluate regional wall motion. The left ventricular internal cavity size was normal in size. There is mild left ventricular hypertrophy. Left ventricular diastolic parameters are consistent with Grade I diastolic dysfunction (impaired relaxation). Right Ventricle: The right ventricular size is mildly enlarged. Right vetricular wall thickness was not well visualized. Right ventricular systolic function is normal. Tricuspid regurgitation signal is inadequate for assessing PA pressure. Left Atrium: Left atrial size was normal in size. Right Atrium: Right atrial size was normal in size. Pericardium: There is no evidence of pericardial effusion. Mitral Valve: The mitral valve was not well visualized. Trivial mitral valve regurgitation. Tricuspid Valve: The tricuspid valve is not  well visualized. Tricuspid valve regurgitation is trivial. Aortic Valve: The aortic valve was not well visualized. There is moderate calcification of the aortic valve. There is moderate thickening of the aortic valve. Aortic valve regurgitation is trivial. Mild to moderate aortic valve sclerosis/calcification is  present, without any evidence of aortic stenosis. Pulmonic Valve: The pulmonic valve was not well visualized. Pulmonic valve regurgitation is not visualized. No evidence of pulmonic stenosis. Aorta: Aortic dilatation noted. There is mild dilatation of the aortic root, measuring 39 mm. Pulmonary Artery: The pulmonary artery is of normal size. Venous: The inferior vena cava was not well visualized. IAS/Shunts: The interatrial septum was not well visualized.  LEFT VENTRICLE PLAX 2D LVIDd:         4.04 cm  Diastology LVIDs:         2.91 cm  LV e' medial:    5.66 cm/s LV PW:         1.04 cm  LV E/e' medial:  11.2 LV IVS:        1.18 cm  LV e' lateral:   7.94 cm/s LVOT diam:     2.10 cm  LV E/e' lateral: 8.0 LVOT Area:     3.46 cm  RIGHT VENTRICLE             IVC RV S prime:     18.40 cm/s  IVC diam: 2.18 cm TAPSE (M-mode): 2.7 cm LEFT ATRIUM             Index       RIGHT ATRIUM           Index LA diam:        4.60 cm 2.08 cm/m  RA Area:     12.60 cm LA Vol (A2C):   40.6 ml 18.37 ml/m RA Volume:   26.50 ml  11.99 ml/m LA Vol (A4C):   48.0 ml 21.72 ml/m LA Biplane Vol: 44.8 ml 20.27 ml/m   AORTA Ao Root diam: 3.90 cm MITRAL VALVE MV Area (PHT): 2.23 cm    SHUNTS MV Decel Time: 340 msec    Systemic Diam: 2.10 cm MV E velocity: 63.40 cm/s MV A velocity: 84.40 cm/s MV E/A ratio:  0.75 Nelva Bush MD Electronically signed by Nelva Bush MD Signature Date/Time: 07/24/2020/6:38:12 AM    Final       Scheduled  Meds: . ARIPiprazole  5 mg Oral Daily  . atorvastatin  40 mg Oral Daily  . dicyclomine  10 mg Oral Once  . enoxaparin (LOVENOX) injection  40 mg Subcutaneous Q24H  . FLUoxetine  40 mg Oral  Daily  . influenza vaccine adjuvanted  0.5 mL Intramuscular Tomorrow-1000  . insulin aspart  0-5 Units Subcutaneous QHS  . insulin aspart  0-9 Units Subcutaneous TID WC  . insulin aspart  5 Units Subcutaneous TID WC  . insulin glargine  30 Units Subcutaneous Daily  . sodium chloride flush  3 mL Intravenous Q12H   Continuous Infusions: . sodium chloride    . famotidine (PEPCID) IV 20 mg (07/23/20 2130)     LOS: 0 days      Time spent: 35 minutes   Dessa Phi, DO [facility] 07/24/2020, 9:16 AM   Available via Epic secure chat 7am-7pm After these hours, please refer to coverage provider listed on [email or web address]

## 2020-07-25 ENCOUNTER — Telehealth: Payer: Self-pay | Admitting: Family Medicine

## 2020-07-25 DIAGNOSIS — S060X9A Concussion with loss of consciousness of unspecified duration, initial encounter: Secondary | ICD-10-CM

## 2020-07-25 DIAGNOSIS — R001 Bradycardia, unspecified: Secondary | ICD-10-CM

## 2020-07-25 DIAGNOSIS — I951 Orthostatic hypotension: Principal | ICD-10-CM

## 2020-07-25 LAB — BASIC METABOLIC PANEL
Anion gap: 7 (ref 5–15)
BUN: 18 mg/dL (ref 8–23)
CO2: 27 mmol/L (ref 22–32)
Calcium: 9.2 mg/dL (ref 8.9–10.3)
Chloride: 103 mmol/L (ref 98–111)
Creatinine, Ser: 1.18 mg/dL (ref 0.61–1.24)
GFR, Estimated: >60 mL/min (ref >60)
Glucose, Bld: 244 mg/dL — ABNORMAL HIGH (ref 70–99)
Potassium: 4.2 mmol/L (ref 3.5–5.1)
Sodium: 137 mmol/L (ref 135–145)

## 2020-07-25 LAB — GLUCOSE, CAPILLARY
Glucose-Capillary: 220 mg/dL — ABNORMAL HIGH (ref 70–99)
Glucose-Capillary: 251 mg/dL — ABNORMAL HIGH (ref 70–99)
Glucose-Capillary: 264 mg/dL — ABNORMAL HIGH (ref 70–99)
Glucose-Capillary: 235 mg/dL — ABNORMAL HIGH (ref 70–99)

## 2020-07-25 MED ORDER — INSULIN ASPART 100 UNIT/ML ~~LOC~~ SOLN
0.0000 [IU] | Freq: Three times a day (TID) | SUBCUTANEOUS | Status: DC
  Administered 2020-07-26: 12:00:00 5 [IU] via SUBCUTANEOUS
  Administered 2020-07-26 (×2): 3 [IU] via SUBCUTANEOUS
  Administered 2020-07-27: 5 [IU] via SUBCUTANEOUS
  Administered 2020-07-27: 3 [IU] via SUBCUTANEOUS
  Administered 2020-07-28 – 2020-07-29 (×2): 5 [IU] via SUBCUTANEOUS
  Administered 2020-07-29 (×2): 3 [IU] via SUBCUTANEOUS
  Filled 2020-07-25 (×10): qty 1

## 2020-07-25 MED ORDER — FAMOTIDINE 20 MG PO TABS
20.0000 mg | ORAL_TABLET | Freq: Two times a day (BID) | ORAL | Status: DC
  Administered 2020-07-25 – 2020-07-29 (×9): 20 mg via ORAL
  Filled 2020-07-25 (×9): qty 1

## 2020-07-25 NOTE — Progress Notes (Signed)
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 Pt has prior history of syncope several years ago. Pt reports dizziness and nausea earlier in day and previous day. Pt full independent at baseline.    PT Comments    Pt in bed upon entry agreeable to participate. Pt reports not having been OOB since author's last visit. Pt reports issues with nausea/dizziness much of previous day, much better today. ModI bed mobility, supervisionfor transfers due to more unsteadiness this date. Pt has orthostatic BP drop in standing which remains stable for 2-3 minutes, pt denies any repeat dizziness/nausea, however yawning 8-10x in session, an empirical sign associated with cerebral hypoperfusion in the setting of orthostatic hypotension.  Pt also more ataxic and clumsy with gross motor patterns in session, somewhat more flat affect and hypoactive whilst seated. EOS, seated BP in 170s SBP; Pt reports his two adult sons take medication for baseline bradycardia issues associated with episodic presyncope.    Follow Up Recommendations  Supervision for mobility/OOB;Supervision - Intermittent;Home health PT     Equipment Recommendations  None recommended by PT    Recommendations for Other Services       Precautions / Restrictions Precautions Precautions: Fall Restrictions Weight Bearing Restrictions: No    Mobility  Bed Mobility Overal bed mobility: Independent                Transfers Overall transfer level: Needs assistance   Transfers: Sit to/from Stand Sit to Stand: Supervision         General transfer comment: More unsteady and weak appearing compared to 2 days prior.  Ambulation/Gait Ambulation/Gait assistance: Modified independent (Device/Increase time) Gait Distance (Feet): 98 Feet Assistive  device: None       General Gait Details: mildly ataxic, multiple LOB; yawning ~8x whilst up (none prior to entyr per patient)   Stairs             Wheelchair Mobility    Modified Rankin (Stroke Patients Only)       Balance Overall balance assessment: Modified Independent                                          Cognition Arousal/Alertness: Awake/alert Behavior During Therapy: WFL for tasks assessed/performed Overall Cognitive Status: Within Functional Limits for tasks assessed                                        Exercises      General Comments        Pertinent Vitals/Pain Pain Assessment: No/denies pain    Home Living Family/patient expects to be discharged to:: Private residence Living Arrangements: Alone Available Help at Discharge: Family Type of Home: House Home Access: Stairs to enter   Home Layout: Able to live on main level with bedroom/bathroom;Two level        Prior Function            PT Goals (current goals can now be found in the care plan section) Acute Rehab PT Goals Patient Stated Goal: return to PLOF activity PT Goal Formulation: With patient Time For Goal Achievement: 08/06/20 Potential to  Achieve Goals: Good Progress towards PT goals: Progressing toward goals    Frequency    Min 2X/week      PT Plan Current plan remains appropriate    Co-evaluation              AM-PAC PT "6 Clicks" Mobility   Outcome Measure  Help needed turning from your back to your side while in a flat bed without using bedrails?: None Help needed moving from lying on your back to sitting on the side of a flat bed without using bedrails?: None Help needed moving to and from a bed to a chair (including a wheelchair)?: A Little Help needed standing up from a chair using your arms (e.g., wheelchair or bedside chair)?: A Little Help needed to walk in hospital room?: A Little Help needed climbing 3-5 steps  with a railing? : A Lot 6 Click Score: 19    End of Session Equipment Utilized During Treatment: Gait belt Activity Tolerance: Patient tolerated treatment well;Patient limited by lethargy;Treatment limited secondary to medical complications (Comment) Patient left: in chair;with call bell/phone within reach;with family/visitor present;with nursing/sitter in room Nurse Communication: Mobility status PT Visit Diagnosis: Other symptoms and signs involving the nervous system (R29.898);Dizziness and giddiness (R42)     Time: 6578-4696 PT Time Calculation (min) (ACUTE ONLY): 14 min  Charges:  $Therapeutic Exercise: 8-22 mins                     1:41 PM, 07/25/20 Etta Grandchild, PT, DPT Physical Therapist - [facility]  [phone] ([place])    [name] 07/25/2020, 1:33 PM

## 2020-07-25 NOTE — Telephone Encounter (Signed)
Please advise pt's wife is not on [facility]

## 2020-07-25 NOTE — Progress Notes (Addendum)
PROGRESS NOTE    Jack Estrada  GUR:[id number] DOB: 02-Mar-1946 DOA: 07/22/2020 PCP: Guadalupe Maple, MD     Brief Narrative:  Jack Estrada is a 74 y.o. male with medical history significant for hypertension, IDDM, depression/anxiety, history of penile condyloma acuminata initially thought as [facility] pathologist read as lichen planus) status post resection 10/19/19, hyperlipidemia, presented to the emergency department via EMS for chief concerns of syncopal episode.  New events last 24 hours / Subjective: Still reports headache, but notes improved dizziness.  Patient still with orthostatic hypotension.  Assessment & Plan:   Principal Problem:   Syncope Active Problems:   Insulin dependent type 2 diabetes mellitus (HCC)   Hyperlipidemia associated with type 2 diabetes mellitus ([place])   Hypertension associated with type 2 diabetes mellitus (HCC)   BPH (benign prostatic hyperplasia)   Depression, recurrent (HCC)   Anxiety   Concussion   Syncope Orthostatic hypotension Sinus bradycardia Patient with sinus bradycardia on telemetry and EKG Still with orthostatic hypotension Troponin negative Echocardiogram unremarkable Cardiology following, plan for 2-week Holter monitor placed prior to discharge Continue to hold amlodipine Daily orthostatic vital signs Continue IV fluids (lost IV access, patient refused to have one placed, educated patient on the need to have IV in place) Monitor on telemetry   AKI on CKD stage IIIa Resolved Baseline creatinine 1.1, currently around baseline Renal ultrasound unremarkable Strict I's and O's, daily BMP  Diabetes mellitus type 2 with hyperglycemia Hemoglobin A1c 10.4 Continue Lantus, NovoLog, sliding scale  Hyperlipidemia Continue Lipitor  Depression/anxiety Continue home Ativan, Abilify, fluoxetine  Posterior scalp laceration Present on admission, supportive care  Likely concussion after syncopal episode Supportive care   DVT  prophylaxis:  enoxaparin (LOVENOX) injection 40 mg Start: 07/22/20 2200 Place TED hose Start: 07/22/20 1728  Code Status: Full code Family Communication: None at bedside  Disposition Plan:  Status is: Inpatient  The patient will require care spanning > 2 midnights and should be moved to inpatient because: Hemodynamically unstable  Dispo: The patient is from: Home              Anticipated d/c is to: Home              Anticipated d/c date is: 1 day              Patient currently is not medically stable to d/c.  IV fluid due to orthostatic hypotension    Consultants:   Cardiology  Procedures:   None   Antimicrobials:  Anti-infectives (From admission, onward)   None       Objective: Vitals:   07/25/20 0519 07/25/20 0822 07/25/20 1140 07/25/20 1556  BP: (!) 157/70 (!) 149/81 (!) 144/81 (!) 158/76  Pulse: (!) 49 (!) 49 (!) 52 (!) 53  Resp: 20 17 17    Temp: 98.5 F (36.9 C) 98 F (36.7 C) 98 F (36.7 C) 97.8 F (36.6 C)  TempSrc:  Oral Oral Oral  SpO2: 97% 99% 98% 99%  Weight:      Height:        Intake/Output Summary (Last 24 hours) at 07/25/2020 1655 Last data filed at 07/25/2020 1417 Gross per 24 hour  Intake 720 ml  Output 975 ml  Net -255 ml   Filed Weights   07/22/20 1331 07/24/20 0500  Weight: 98.9 kg 98.8 kg    Examination:  General: NAD   Cardiovascular: S1, S2 present  Respiratory: CTAB  Abdomen: Soft, nontender, nondistended, bowel sounds present  Musculoskeletal:  No bilateral pedal edema noted  Skin: Normal  Psychiatry: Normal mood   Data Reviewed: I have personally reviewed following labs and imaging studies  CBC: Recent Labs  Lab 07/22/20 1334 07/23/20 0436  WBC 7.9 7.6  NEUTROABS 3.8  --   HGB 13.7 13.5  HCT 39.4 40.0  MCV 87.4 90.1  PLT 149* 756*   Basic Metabolic Panel: Recent Labs  Lab 07/22/20 1334 07/23/20 0436 07/24/20 0441 07/25/20 0509  NA 134* 138 138 137  K 3.7 3.9 4.7 4.2  CL 99 99 102 103  CO2  19* 29 30 27   GLUCOSE 264* 145* 254* 244*  BUN 24* 20 24* 18  CREATININE 1.56* 1.39* 1.61* 1.18  CALCIUM 10.2 9.3 9.0 9.2  MG 1.6*  --   --   --    GFR: Estimated Creatinine Clearance: 68.9 mL/min (by C-G formula based on SCr of 1.18 mg/dL). Liver Function Tests: Recent Labs  Lab 07/22/20 1334  AST 25  ALT 26  ALKPHOS 78  BILITOT 1.2  PROT 7.1  ALBUMIN 3.7   No results for input(s): LIPASE, AMYLASE in the last 168 hours. No results for input(s): AMMONIA in the last 168 hours. Coagulation Profile: No results for input(s): INR, PROTIME in the last 168 hours. Cardiac Enzymes: No results for input(s): CKTOTAL, CKMB, CKMBINDEX, TROPONINI in the last 168 hours. BNP (last 3 results) No results for input(s): PROBNP in the last 8760 hours. HbA1C: Recent Labs    07/23/20 0436  HGBA1C 10.4*   CBG: Recent Labs  Lab 07/24/20 1052 07/24/20 1628 07/24/20 2123 07/25/20 0818 07/25/20 1143  GLUCAP 224* 216* 171* 220* 251*   Lipid Profile: No results for input(s): CHOL, HDL, LDLCALC, TRIG, CHOLHDL, LDLDIRECT in the last 72 hours. Thyroid Function Tests: No results for input(s): TSH, T4TOTAL, FREET4, T3FREE, THYROIDAB in the last 72 hours. Anemia Panel: No results for input(s): VITAMINB12, FOLATE, FERRITIN, TIBC, IRON, RETICCTPCT in the last 72 hours. Sepsis Labs: No results for input(s): PROCALCITON, LATICACIDVEN in the last 168 hours.  Recent Results (from the past 240 hour(s))  Respiratory Panel by RT PCR (Flu A&B, Covid) - Nasopharyngeal Swab     Status: None   Collection Time: 07/22/20  1:34 PM   Specimen: Nasopharyngeal Swab  Result Value Ref Range Status   SARS Coronavirus 2 by RT PCR NEGATIVE NEGATIVE Final    Comment: (NOTE) SARS-CoV-2 target nucleic acids are NOT DETECTED.  The SARS-CoV-2 RNA is generally detectable in upper respiratoy specimens during the acute phase of infection. The lowest concentration of SARS-CoV-2 viral copies this assay can detect is 131  copies/mL. A negative result does not preclude SARS-Cov-2 infection and should not be used as the sole basis for treatment or other patient management decisions. A negative result may occur with  improper specimen collection/handling, submission of specimen other than nasopharyngeal swab, presence of viral mutation(s) within the areas targeted by this assay, and inadequate number of viral copies (<131 copies/mL). A negative result must be combined with clinical observations, patient history, and epidemiological information. The expected result is Negative.  Fact Sheet for Patients:  PinkCheek.be  Fact Sheet for Healthcare Providers:  GravelBags.it  This test is no t yet approved or cleared by the [place] FDA and  has been authorized for detection and/or diagnosis of SARS-CoV-2 by FDA under an Emergency Use Authorization (EUA). This EUA will remain  in effect (meaning this test can be used) for the duration of the COVID-19 declaration under Section 564(b)(1)  of the Act, 21 U.S.C. section 360bbb-3(b)(1), unless the authorization is terminated or revoked sooner.     Influenza A by PCR NEGATIVE NEGATIVE Final   Influenza B by PCR NEGATIVE NEGATIVE Final    Comment: (NOTE) The Xpert Xpress SARS-CoV-2/FLU/RSV assay is intended as an aid in  the diagnosis of influenza from Nasopharyngeal swab specimens and  should not be used as a sole basis for treatment. Nasal washings and  aspirates are unacceptable for Xpert Xpress SARS-CoV-2/FLU/RSV  testing.  Fact Sheet for Patients: PinkCheek.be  Fact Sheet for Healthcare Providers: GravelBags.it  This test is not yet approved or cleared by the [place] FDA and  has been authorized for detection and/or diagnosis of SARS-CoV-2 by  FDA under an Emergency Use Authorization (EUA). This EUA will remain  in effect (meaning  this test can be used) for the duration of the  Covid-19 declaration under Section 564(b)(1) of the Act, 21  U.S.C. section 360bbb-3(b)(1), unless the authorization is  terminated or revoked. Performed at [facility], [address] [place], [place] [address]       Radiology Studies: US RENAL  Result Date: 07/24/2020 CLINICAL DATA:  Acute renal insufficiency EXAM: RENAL / URINARY TRACT ULTRASOUND COMPLETE COMPARISON:  CT scan 09/01/2019 FINDINGS: Right Kidney: Renal measurements: 11.3 x 5.5 x 5.1 cm = volume: 163 mL. Normal renal cortical thickness and echogenicity. Mild cortical scarring type changes are noted and appears stable since the prior CT scan. No worrisome renal lesions or hydronephrosis. Left Kidney: Renal measurements: 12.0 x 5.1 x 3.5 cm = volume: Inter 175 mL. Normal renal cortical thickness and echogenicity. Renal cortical scarring changes are noted. No worrisome renal lesions or hydronephrosis. Bladder: Appears normal for degree of bladder distention. Bilateral ureteral jets are noted. Other: None. IMPRESSION: 1. Renal cortical scarring changes bilaterally but no worrisome renal lesions or hydronephrosis. 2. Normal bladder. Electronically Signed   By: Marijo Sanes M.D.   On: 07/24/2020 08:40   ECHOCARDIOGRAM COMPLETE  Result Date: 07/24/2020    ECHOCARDIOGRAM REPORT   Patient Name:   Jack Estrada Date of Exam: 07/23/2020 Medical Rec #:  [id number]     Height:       72.0 in Accession #:    [id number]    Weight:       218.0 lb Date of Birth:  04/09/1946      BSA:          2.210 m Patient Age:    64 years      BP:           148/86 mmHg Patient Gender: M             HR:           64 bpm. Exam Location:  [facility] Procedure: 2D Echo Indications:     Syncope 780.2 / R55  History:         Patient has no prior history of Echocardiogram examinations.                  Risk Factors:Hypertension, Diabetes and Dyslipidemia.  Sonographer:     Avanell Shackleton Referring Phys:  [id number] JENNIFER  CHOI Diagnosing Phys: Nelva Bush MD IMPRESSIONS  1. Left ventricular ejection fraction, by estimation, is 55 to 60%. The left ventricle has normal function. Left ventricular endocardial border not optimally defined to evaluate regional wall motion. There is mild left ventricular hypertrophy. Left ventricular diastolic parameters are consistent with Grade I diastolic dysfunction (impaired  relaxation).  2. Right ventricular systolic function is normal. The right ventricular size is mildly enlarged. Tricuspid regurgitation signal is inadequate for assessing PA pressure.  3. The mitral valve was not well visualized. Trivial mitral valve regurgitation.  4. The aortic valve was not well visualized. There is moderate calcification of the aortic valve. There is moderate thickening of the aortic valve. Aortic valve regurgitation is trivial. Mild to moderate aortic valve sclerosis/calcification is present, without any evidence of aortic stenosis.  5. Aortic dilatation noted. There is mild dilatation of the aortic root, measuring 39 mm. FINDINGS  Left Ventricle: Left ventricular ejection fraction, by estimation, is 55 to 60%. The left ventricle has normal function. Left ventricular endocardial border not optimally defined to evaluate regional wall motion. The left ventricular internal cavity size was normal in size. There is mild left ventricular hypertrophy. Left ventricular diastolic parameters are consistent with Grade I diastolic dysfunction (impaired relaxation). Right Ventricle: The right ventricular size is mildly enlarged. Right vetricular wall thickness was not well visualized. Right ventricular systolic function is normal. Tricuspid regurgitation signal is inadequate for assessing PA pressure. Left Atrium: Left atrial size was normal in size. Right Atrium: Right atrial size was normal in size. Pericardium: There is no evidence of pericardial effusion. Mitral Valve: The mitral valve was not well visualized.  Trivial mitral valve regurgitation. Tricuspid Valve: The tricuspid valve is not well visualized. Tricuspid valve regurgitation is trivial. Aortic Valve: The aortic valve was not well visualized. There is moderate calcification of the aortic valve. There is moderate thickening of the aortic valve. Aortic valve regurgitation is trivial. Mild to moderate aortic valve sclerosis/calcification is  present, without any evidence of aortic stenosis. Pulmonic Valve: The pulmonic valve was not well visualized. Pulmonic valve regurgitation is not visualized. No evidence of pulmonic stenosis. Aorta: Aortic dilatation noted. There is mild dilatation of the aortic root, measuring 39 mm. Pulmonary Artery: The pulmonary artery is of normal size. Venous: The inferior vena cava was not well visualized. IAS/Shunts: The interatrial septum was not well visualized.  LEFT VENTRICLE PLAX 2D LVIDd:         4.04 cm  Diastology LVIDs:         2.91 cm  LV e' medial:    5.66 cm/s LV PW:         1.04 cm  LV E/e' medial:  11.2 LV IVS:        1.18 cm  LV e' lateral:   7.94 cm/s LVOT diam:     2.10 cm  LV E/e' lateral: 8.0 LVOT Area:     3.46 cm  RIGHT VENTRICLE             IVC RV S prime:     18.40 cm/s  IVC diam: 2.18 cm TAPSE (M-mode): 2.7 cm LEFT ATRIUM             Index       RIGHT ATRIUM           Index LA diam:        4.60 cm 2.08 cm/m  RA Area:     12.60 cm LA Vol (A2C):   40.6 ml 18.37 ml/m RA Volume:   26.50 ml  11.99 ml/m LA Vol (A4C):   48.0 ml 21.72 ml/m LA Biplane Vol: 44.8 ml 20.27 ml/m   AORTA Ao Root diam: 3.90 cm MITRAL VALVE MV Area (PHT): 2.23 cm    SHUNTS MV Decel Time: 340 msec    Systemic Diam:  2.10 cm MV E velocity: 63.40 cm/s MV A velocity: 84.40 cm/s MV E/A ratio:  0.75 Nelva Bush MD Electronically signed by Nelva Bush MD Signature Date/Time: 07/24/2020/6:38:12 AM    Final       Scheduled Meds: . ARIPiprazole  5 mg Oral Daily  . atorvastatin  40 mg Oral Daily  . enoxaparin (LOVENOX) injection  40 mg  Subcutaneous Q24H  . famotidine  20 mg Oral BID  . FLUoxetine  40 mg Oral Daily  . influenza vaccine adjuvanted  0.5 mL Intramuscular Tomorrow-1000  . insulin aspart  0-5 Units Subcutaneous QHS  . insulin aspart  0-9 Units Subcutaneous TID WC  . insulin aspart  5 Units Subcutaneous TID WC  . insulin glargine  30 Units Subcutaneous Daily  . sodium chloride flush  3 mL Intravenous Q12H   Continuous Infusions: . sodium chloride 100 mL/hr at 07/24/20 1423     LOS: 1 day        Alma Friendly, MD [facility] 07/25/2020, 4:55 PM   Available via Epic secure chat 7am-7pm After these hours, please refer to coverage provider listed on [email or web address]

## 2020-07-25 NOTE — Progress Notes (Signed)
[facility] Cardiology    SUBJECTIVE: Jack Estrada is a 74 year old male with a past medical history significant for type 1 diabetes, chronic kidney disease, hyperlipidemia, and hypertension who presented to the ED on 07/22/20 following a syncopal episode.  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  Workup in the ED was significant for blood glucose of 264, creatinine of 1.56, BUN of 24, GFR of 47, high sensitivity troponin negative x 2 at 7 and 6 respectively, BNP of 29, magnesium of 1.6, head CT negative for acute traumatic injury with mild, chronic microvascular ischemic changes, chest xray negative for acute cardiopulmonary disease, and ECG revealing sinus bradycardia at a ventricular rate of 54bpm with a short PR interval, no evidence of a delta wave.   07/23/20: Jack Estrada is lying in bed, in no acute distress. Following his syncopal episode, he experienced nausea, vomiting, and severe dizziness, which have all improved.  Prior to the syncopal episode, he admits to mild dizziness, but denies chest pain, palpitations, or heart racing.  He reports feeling in normal health prior to the episode and had eaten an hour before.  He denies recent illness or sick contacts.  He admits to one prior syncopal episode, occurring a few years ago, in the setting of dehydration.    07/24/20: Today, Jack Estrada woke up with severe dizziness with an associated headache.  Orthostatic vital signs were positive yesterday, amlodipine being held.  Continues to deny chest pain, chest pressure, palpitations, shortness of breath, orthopnea, or PND.   07/25/20: Patient somewhat agitated.  Continues to experience headache, nausea. Continues to deny chest pain, chest pressure, palpitations, shortness of breath, orthopnea, or PND.    Vitals:   07/24/20 1600 07/24/20 1949 07/25/20 0040 07/25/20 0519  BP: (!) 154/69 133/75 135/65 (!) 157/70  Pulse: (!) 50 (!) 51 (!) 52  (!) 49  Resp: 18 14 14 20   Temp: 98.4 F (36.9 C) 97.6 F (36.4 C) 97.6 F (36.4 C) 98.5 F (36.9 C)  TempSrc: Oral Oral Oral   SpO2: 99% 99% 97% 97%  Weight:      Height:         Intake/Output Summary (Last 24 hours) at 07/25/2020 0820 Last data filed at 07/25/2020 3244 Gross per 24 hour  Intake 600 ml  Output 1375 ml  Net -775 ml      PHYSICAL EXAM  General: Well developed, well nourished, in no acute distress HEENT:  Normocephalic and atramatic Neck:  No JVD.  Lungs: Clear bilaterally to auscultation and percussion. Heart: HRRR . Normal S1 and S2 without gallops or murmurs.  Abdomen: Bowel sounds are positive, abdomen soft and non-tender  Msk:  Back normal.  Normal strength and tone for age. Extremities: No clubbing, cyanosis or edema.   Neuro: Alert and oriented X 3. Psych:  Good affect, responds appropriately   LABS: Basic Metabolic Panel: Recent Labs    07/22/20 1334 07/23/20 0436 07/24/20 0441 07/25/20 0509  NA 134*   < > 138 137  K 3.7   < > 4.7 4.2  CL 99   < > 102 103  CO2 19*   < > 30 27  GLUCOSE 264*   < > 254* 244*  BUN 24*   < > 24* 18  CREATININE 1.56*   < > 1.61* 1.18  CALCIUM 10.2   < > 9.0 9.2  MG 1.6*  --   --   --    < > =  values in this interval not displayed.   Liver Function Tests: Recent Labs    07/22/20 1334  AST 25  ALT 26  ALKPHOS 78  BILITOT 1.2  PROT 7.1  ALBUMIN 3.7   No results for input(s): LIPASE, AMYLASE in the last 72 hours. CBC: Recent Labs    07/22/20 1334 07/23/20 0436  WBC 7.9 7.6  NEUTROABS 3.8  --   HGB 13.7 13.5  HCT 39.4 40.0  MCV 87.4 90.1  PLT 149* 145*   Cardiac Enzymes: No results for input(s): CKTOTAL, CKMB, CKMBINDEX, TROPONINI in the last 72 hours. BNP: Invalid input(s): POCBNP D-Dimer: No results for input(s): DDIMER in the last 72 hours. Hemoglobin A1C: Recent Labs    07/23/20 0436  HGBA1C 10.4*   Fasting Lipid Panel: No results for input(s): CHOL, HDL, LDLCALC, TRIG,  CHOLHDL, LDLDIRECT in the last 72 hours. Thyroid Function Tests: Recent Labs    07/22/20 1334  TSH 2.945   Anemia Panel: No results for input(s): VITAMINB12, FOLATE, FERRITIN, TIBC, IRON, RETICCTPCT in the last 72 hours.  US RENAL  Result Date: 07/24/2020 CLINICAL DATA:  Acute renal insufficiency EXAM: RENAL / URINARY TRACT ULTRASOUND COMPLETE COMPARISON:  CT scan 09/01/2019 FINDINGS: Right Kidney: Renal measurements: 11.3 x 5.5 x 5.1 cm = volume: 163 mL. Normal renal cortical thickness and echogenicity. Mild cortical scarring type changes are noted and appears stable since the prior CT scan. No worrisome renal lesions or hydronephrosis. Left Kidney: Renal measurements: 12.0 x 5.1 x 3.5 cm = volume: Inter 175 mL. Normal renal cortical thickness and echogenicity. Renal cortical scarring changes are noted. No worrisome renal lesions or hydronephrosis. Bladder: Appears normal for degree of bladder distention. Bilateral ureteral jets are noted. Other: None. IMPRESSION: 1. Renal cortical scarring changes bilaterally but no worrisome renal lesions or hydronephrosis. 2. Normal bladder. Electronically Signed   By: Marijo Sanes M.D.   On: 07/24/2020 08:40   ECHOCARDIOGRAM COMPLETE  Result Date: 07/24/2020    ECHOCARDIOGRAM REPORT   Patient Name:   Jack Estrada Date of Exam: 07/23/2020 Medical Rec #:  [id number]     Height:       72.0 in Accession #:    [id number]    Weight:       218.0 lb Date of Birth:  08/18/1946      BSA:          2.210 m Patient Age:    55 years      BP:           148/86 mmHg Patient Gender: M             HR:           64 bpm. Exam Location:  [facility] Procedure: 2D Echo Indications:     Syncope 780.2 / R55  History:         Patient has no prior history of Echocardiogram examinations.                  Risk Factors:Hypertension, Diabetes and Dyslipidemia.  Sonographer:     Avanell Shackleton Referring Phys:  [id number] JENNIFER CHOI Diagnosing Phys: Nelva Bush MD IMPRESSIONS  1. Left ventricular  ejection fraction, by estimation, is 55 to 60%. The left ventricle has normal function. Left ventricular endocardial border not optimally defined to evaluate regional wall motion. There is mild left ventricular hypertrophy. Left ventricular diastolic parameters are consistent with Grade I diastolic dysfunction (impaired relaxation).  2. Right ventricular systolic function is normal. The  right ventricular size is mildly enlarged. Tricuspid regurgitation signal is inadequate for assessing PA pressure.  3. The mitral valve was not well visualized. Trivial mitral valve regurgitation.  4. The aortic valve was not well visualized. There is moderate calcification of the aortic valve. There is moderate thickening of the aortic valve. Aortic valve regurgitation is trivial. Mild to moderate aortic valve sclerosis/calcification is present, without any evidence of aortic stenosis.  5. Aortic dilatation noted. There is mild dilatation of the aortic root, measuring 39 mm. FINDINGS  Left Ventricle: Left ventricular ejection fraction, by estimation, is 55 to 60%. The left ventricle has normal function. Left ventricular endocardial border not optimally defined to evaluate regional wall motion. The left ventricular internal cavity size was normal in size. There is mild left ventricular hypertrophy. Left ventricular diastolic parameters are consistent with Grade I diastolic dysfunction (impaired relaxation). Right Ventricle: The right ventricular size is mildly enlarged. Right vetricular wall thickness was not well visualized. Right ventricular systolic function is normal. Tricuspid regurgitation signal is inadequate for assessing PA pressure. Left Atrium: Left atrial size was normal in size. Right Atrium: Right atrial size was normal in size. Pericardium: There is no evidence of pericardial effusion. Mitral Valve: The mitral valve was not well visualized. Trivial mitral valve regurgitation. Tricuspid Valve: The tricuspid valve is not  well visualized. Tricuspid valve regurgitation is trivial. Aortic Valve: The aortic valve was not well visualized. There is moderate calcification of the aortic valve. There is moderate thickening of the aortic valve. Aortic valve regurgitation is trivial. Mild to moderate aortic valve sclerosis/calcification is  present, without any evidence of aortic stenosis. Pulmonic Valve: The pulmonic valve was not well visualized. Pulmonic valve regurgitation is not visualized. No evidence of pulmonic stenosis. Aorta: Aortic dilatation noted. There is mild dilatation of the aortic root, measuring 39 mm. Pulmonary Artery: The pulmonary artery is of normal size. Venous: The inferior vena cava was not well visualized. IAS/Shunts: The interatrial septum was not well visualized.  LEFT VENTRICLE PLAX 2D LVIDd:         4.04 cm  Diastology LVIDs:         2.91 cm  LV e' medial:    5.66 cm/s LV PW:         1.04 cm  LV E/e' medial:  11.2 LV IVS:        1.18 cm  LV e' lateral:   7.94 cm/s LVOT diam:     2.10 cm  LV E/e' lateral: 8.0 LVOT Area:     3.46 cm  RIGHT VENTRICLE             IVC RV S prime:     18.40 cm/s  IVC diam: 2.18 cm TAPSE (M-mode): 2.7 cm LEFT ATRIUM             Index       RIGHT ATRIUM           Index LA diam:        4.60 cm 2.08 cm/m  RA Area:     12.60 cm LA Vol (A2C):   40.6 ml 18.37 ml/m RA Volume:   26.50 ml  11.99 ml/m LA Vol (A4C):   48.0 ml 21.72 ml/m LA Biplane Vol: 44.8 ml 20.27 ml/m   AORTA Ao Root diam: 3.90 cm MITRAL VALVE MV Area (PHT): 2.23 cm    SHUNTS MV Decel Time: 340 msec    Systemic Diam: 2.10 cm MV E velocity: 63.40 cm/s MV A velocity:  84.40 cm/s MV E/A ratio:  0.75 Harrell Gave End MD Electronically signed by Nelva Bush MD Signature Date/Time: 07/24/2020/6:38:12 AM    Final     Echo: Normal RV and LV systolic function with an EF estimated between 55-60% with mild LVH, grade 1 diastolic dysfunction. Mild to moderate aortic valve sclerosis without evidence of stenosis. Mild aortic root  dilatation, measuring 37mm   TELEMETRY: Sinus bradycardia, rates fluctuating in the upper 40s to low 60s   ASSESSMENT AND PLAN:  Principal Problem:   Syncope Active Problems:   Insulin dependent type 2 diabetes mellitus (HCC)   Hyperlipidemia associated with type 2 diabetes mellitus ([place])   Hypertension associated with type 2 diabetes mellitus (HCC)   BPH (benign prostatic hyperplasia)   Depression, recurrent ([place])   Anxiety   Concussion    1.  Syncope             -Etiology may be due to underlying orthostatic hypotension; amlodipine currently being held   -Continue IV fluids   -Echocardiogram this admission revealing normal LV systolic function with no evidence of wall motion abnormalities or significant valvular disease  -No further inpatient cardiac workup indicated at this time; will place 2 week Holter monitor prior to discharge            2.  Diabetes             -Continue SSI  3.  Hyperlipidemia             -Continue atorvastatin 40mg  daily   4.  Hypertension             -Orthostatic hypotension noted; amlodipine currently being held   5.  Bradycardia              -Rate fluctuating in the mid 40s to upper 50s             -Will continue to monitor for marked bradycardia, prolonged pauses while admitted, and place 2 week Holter prior to discharge   The history, physical exam findings, and plan of care were all discussed with Dr. Bartholome Bill, and all decision making was made in collaboration.   Avie Arenas  PA-C 07/25/2020 8:20 AM

## 2020-07-25 NOTE — Telephone Encounter (Signed)
Please let patient know since she is not on DPR list for patient, we can not really provide much information due to privacy act.  I recommend she ask her husband to add her to list next office visit.  I would highly encourage her to reach out to providers or nurses in hospital, as they will have more information and may be able to get her husband's permission to discuss case with her.

## 2020-07-25 NOTE — Telephone Encounter (Signed)
Wife, Joseph Art calling this am to ask if Henrine Screws will look at pt's chart and shed some light for her on what is going on. Pt admitted to the hospital after a fall in a public place and hit his head on the floor. Pt told her his bp dropped, but she is not sure because pt is so groggy when he calls.  Joseph Art states they will only let one person a day see the pt, so she has not seen him yet.  Other family members have gone.  She has not talked to a dr, and she is concerned he is not asking the right questions.  She is just worried about him and did not know which direction to go.  Please advise

## 2020-07-26 LAB — GLUCOSE, CAPILLARY
Glucose-Capillary: 176 mg/dL — ABNORMAL HIGH (ref 70–99)
Glucose-Capillary: 225 mg/dL — ABNORMAL HIGH (ref 70–99)
Glucose-Capillary: 169 mg/dL — ABNORMAL HIGH (ref 70–99)

## 2020-07-26 MED ORDER — INSULIN ASPART 100 UNIT/ML ~~LOC~~ SOLN
7.0000 [IU] | Freq: Three times a day (TID) | SUBCUTANEOUS | Status: DC
  Administered 2020-07-26 – 2020-07-29 (×9): 7 [IU] via SUBCUTANEOUS
  Filled 2020-07-26 (×9): qty 1

## 2020-07-26 MED ORDER — POLYETHYLENE GLYCOL 3350 17 G PO PACK
17.0000 g | PACK | Freq: Every day | ORAL | Status: DC
  Administered 2020-07-28 – 2020-07-29 (×2): 17 g via ORAL
  Filled 2020-07-26 (×3): qty 1

## 2020-07-26 MED ORDER — SENNOSIDES-DOCUSATE SODIUM 8.6-50 MG PO TABS
1.0000 | ORAL_TABLET | Freq: Every day | ORAL | Status: DC
  Administered 2020-07-26 – 2020-07-28 (×3): 1 via ORAL
  Filled 2020-07-26 (×3): qty 1

## 2020-07-26 NOTE — Telephone Encounter (Signed)
Called and LVM asking for patient's wife to please return my call.  °

## 2020-07-26 NOTE — Telephone Encounter (Signed)
Pt's wife Joseph Art called back in advised her of Jolene's message she verbalized understanding

## 2020-07-26 NOTE — Progress Notes (Signed)
PROGRESS NOTE    Jack Estrada  WNI:[id number] DOB: 09/01/1946 DOA: 07/22/2020 PCP: Guadalupe Maple, MD     Brief Narrative:  Jack Estrada is a 74 y.o. 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  New events last 24 hours / Subjective: Patient reports improving headache as well as dizziness, as any new complaints.  Was noted to still be orthostatics as of yesterday, repeat orthostatics pending today, as well as ambulation with PT. of note, patient has been noted to refuse vital signs check, as well as IV fluids.  Educated patient on importance of being compliant   Assessment & Plan:   Principal Problem:   Syncope Active Problems:   Insulin dependent type 2 diabetes mellitus (HCC)   Hyperlipidemia associated with type 2 diabetes mellitus ([place])   Hypertension associated with type 2 diabetes mellitus (HCC)   BPH (benign prostatic hyperplasia)   Depression, recurrent (HCC)   Anxiety   Concussion   Syncope Orthostatic hypotension Sinus bradycardia Patient with sinus bradycardia on telemetry and EKG Still with orthostatic hypotension Troponin negative Echocardiogram unremarkable Cardiology following, plan for 2-week Holter monitor placed prior to discharge Continue to hold amlodipine Daily orthostatic vital signs Continue IV fluids (lost IV access, patient refused to have one placed, educated patient on the need to have IV in place) Monitor on telemetry   AKI on CKD stage IIIa Resolved Baseline creatinine 1.1, currently around baseline Renal ultrasound unremarkable Strict I's and O's, daily BMP  Diabetes mellitus type 2 with hyperglycemia Hemoglobin A1c 10.4 Continue Lantus, NovoLog, sliding  scale  Hyperlipidemia Continue Lipitor  Depression/anxiety Continue home Ativan, Abilify, fluoxetine  Posterior scalp laceration Present on admission, supportive care  Likely concussion after syncopal episode Supportive care   DVT prophylaxis:  enoxaparin (LOVENOX) injection 40 mg Start: 07/22/20 2200 Place TED hose Start: 07/22/20 1728  Code Status: Full code Family Communication: Discussed with wife over the phone on 07/26/20 with patients permission Disposition Plan:  Status is: Inpatient  The patient will require care spanning > 2 midnights and should be moved to inpatient because: Hemodynamically unstable and Inpatient level of care appropriate due to severity of illness  Dispo: The patient is from: Home              Anticipated d/c is to: Home              Anticipated d/c date is: 1 day              Patient currently is not medically stable to d/c.  IV fluid due to orthostatic hypotension    Consultants:   Cardiology  Procedures:   None   Antimicrobials:  Anti-infectives (From admission, onward)   None       Objective: Vitals:   07/25/20 1942 07/26/20 0426 07/26/20 0831 07/26/20 1113  BP: 135/64 (!) 144/79 (!) 167/82 135/71  Pulse: (!) 54 (!) 52 (!) 50 (!) 50  Resp: 20 18 18 18   Temp: 97.9 F (36.6 C) 98.3 F (36.8 C) 97.6 F (36.4 C) 97.7 F (36.5 C)  TempSrc:  Oral Oral Oral  SpO2: 98% 98% 99% 97%  Weight:      Height:        Intake/Output Summary (Last 24 hours) at 07/26/2020 1459 Last data filed at  07/26/2020 1435 Gross per 24 hour  Intake 600 ml  Output 3450 ml  Net -2850 ml   Filed Weights   07/22/20 1331 07/24/20 0500  Weight: 98.9 kg 98.8 kg    Examination:  General: NAD   Cardiovascular: S1, S2 present  Respiratory: CTAB  Abdomen: Soft, nontender, nondistended, bowel sounds present  Musculoskeletal: No bilateral pedal edema noted  Skin: Normal  Psychiatry: Normal mood   Data Reviewed: I have personally  reviewed following labs and imaging studies  CBC: Recent Labs  Lab 07/22/20 1334 07/23/20 0436  WBC 7.9 7.6  NEUTROABS 3.8  --   HGB 13.7 13.5  HCT 39.4 40.0  MCV 87.4 90.1  PLT 149* 176*   Basic Metabolic Panel: Recent Labs  Lab 07/22/20 1334 07/23/20 0436 07/24/20 0441 07/25/20 0509  NA 134* 138 138 137  K 3.7 3.9 4.7 4.2  CL 99 99 102 103  CO2 19* 29 30 27   GLUCOSE 264* 145* 254* 244*  BUN 24* 20 24* 18  CREATININE 1.56* 1.39* 1.61* 1.18  CALCIUM 10.2 9.3 9.0 9.2  MG 1.6*  --   --   --    GFR: Estimated Creatinine Clearance: 68.9 mL/min (by C-G formula based on SCr of 1.18 mg/dL). Liver Function Tests: Recent Labs  Lab 07/22/20 1334  AST 25  ALT 26  ALKPHOS 78  BILITOT 1.2  PROT 7.1  ALBUMIN 3.7   No results for input(s): LIPASE, AMYLASE in the last 168 hours. No results for input(s): AMMONIA in the last 168 hours. Coagulation Profile: No results for input(s): INR, PROTIME in the last 168 hours. Cardiac Enzymes: No results for input(s): CKTOTAL, CKMB, CKMBINDEX, TROPONINI in the last 168 hours. BNP (last 3 results) No results for input(s): PROBNP in the last 8760 hours. HbA1C: No results for input(s): HGBA1C in the last 72 hours. CBG: Recent Labs  Lab 07/25/20 1143 07/25/20 1823 07/25/20 2019 07/26/20 0822 07/26/20 1119  GLUCAP 251* 264* 235* 176* 225*   Lipid Profile: No results for input(s): CHOL, HDL, LDLCALC, TRIG, CHOLHDL, LDLDIRECT in the last 72 hours. Thyroid Function Tests: No results for input(s): TSH, T4TOTAL, FREET4, T3FREE, THYROIDAB in the last 72 hours. Anemia Panel: No results for input(s): VITAMINB12, FOLATE, FERRITIN, TIBC, IRON, RETICCTPCT in the last 72 hours. Sepsis Labs: No results for input(s): PROCALCITON, LATICACIDVEN in the last 168 hours.  Recent Results (from the past 240 hour(s))  Respiratory Panel by RT PCR (Flu A&B, Covid) - Nasopharyngeal Swab     Status: None   Collection Time: 07/22/20  1:34 PM    Specimen: Nasopharyngeal Swab  Result Value Ref Range Status   SARS Coronavirus 2 by RT PCR NEGATIVE NEGATIVE Final    Comment: (NOTE) SARS-CoV-2 target nucleic acids are NOT DETECTED.  The SARS-CoV-2 RNA is generally detectable in upper respiratoy specimens during the acute phase of infection. The lowest concentration of SARS-CoV-2 viral copies this assay can detect is 131 copies/mL. A negative result does not preclude SARS-Cov-2 infection and should not be used as the sole basis for treatment or other patient management decisions. A negative result may occur with  improper specimen collection/handling, submission of specimen other than nasopharyngeal swab, presence of viral mutation(s) within the areas targeted by this assay, and inadequate number of viral copies (<131 copies/mL). A negative result must be combined with clinical observations, patient history, and epidemiological information. The expected result is Negative.  Fact Sheet for Patients:  PinkCheek.be  Fact Sheet for Healthcare Providers:  GravelBags.it  This test is no t yet approved or cleared by the [place] and  has been authorized for detection and/or diagnosis of SARS-CoV-2 by FDA under an Emergency Use Authorization (EUA). This EUA will remain  in effect (meaning this test can be used) for the duration of the COVID-19 declaration under Section 564(b)(1) of the Act, 21 U.S.C. section 360bbb-3(b)(1), unless the authorization is terminated or revoked sooner.     Influenza A by PCR NEGATIVE NEGATIVE Final   Influenza B by PCR NEGATIVE NEGATIVE Final    Comment: (NOTE) The Xpert Xpress SARS-CoV-2/FLU/RSV assay is intended as an aid in  the diagnosis of influenza from Nasopharyngeal swab specimens and  should not be used as a sole basis for treatment. Nasal washings and  aspirates are unacceptable for Xpert Xpress SARS-CoV-2/FLU/RSV   testing.  Fact Sheet for Patients: PinkCheek.be  Fact Sheet for Healthcare Providers: GravelBags.it  This test is not yet approved or cleared by the [place] FDA and  has been authorized for detection and/or diagnosis of SARS-CoV-2 by  FDA under an Emergency Use Authorization (EUA). This EUA will remain  in effect (meaning this test can be used) for the duration of the  Covid-19 declaration under Section 564(b)(1) of the Act, 21  U.S.C. section 360bbb-3(b)(1), unless the authorization is  terminated or revoked. Performed at [facility], [address] [place], [place] [address]       Radiology Studies: No results found.    Scheduled Meds: . ARIPiprazole  5 mg Oral Daily  . atorvastatin  40 mg Oral Daily  . enoxaparin (LOVENOX) injection  40 mg Subcutaneous Q24H  . famotidine  20 mg Oral BID  . FLUoxetine  40 mg Oral Daily  . influenza vaccine adjuvanted  0.5 mL Intramuscular Tomorrow-1000  . insulin aspart  0-15 Units Subcutaneous TID WC  . insulin aspart  0-5 Units Subcutaneous QHS  . insulin aspart  7 Units Subcutaneous TID WC  . insulin glargine  30 Units Subcutaneous Daily  . sodium chloride flush  3 mL Intravenous Q12H   Continuous Infusions: . sodium chloride 100 mL/hr at 07/26/20 1105     LOS: 2 days        Alma Friendly, MD [facility] 07/26/2020, 2:59 PM

## 2020-07-26 NOTE — Progress Notes (Signed)
Pt declines vitals to be taken during night shift as he states it is a disturbance to his sleep.

## 2020-07-26 NOTE — TOC Progression Note (Signed)
Transition of Care [facility]) - Progression Note    Patient Details  Name: Jack Estrada MRN: [id number] Date of Birth: [birth year]  [facility]) CM/SW Contact  Shelbie Hutching, RN Phone Number: 07/26/2020, 3:55 PM  Clinical Narrative:     [facility] has accepted home health referral for PT and OT.  Potential for discharge tomorrow.  TOC team to notify Joelene Millin with [facility] when patient discharges.   Expected Discharge Plan: [place] Barriers to Discharge: Continued Medical Work up  Expected Discharge Plan and Services Expected Discharge Plan: [place] arrangements for the past 2 months: [place]: PT, OT [place] Agency: [facility] Date [place]: 07/26/20 Time [place]: [place] Representative spoke with at [place]: [place] ([place]) Interventions    Readmission Risk Interventions No flowsheet data found.

## 2020-07-26 NOTE — Progress Notes (Signed)
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 Pt has prior history of syncope several years ago. Pt reports dizziness and nausea earlier in day and previous day. Pt full independent at baseline.    PT Comments    Pt in bed upon entry, wife in room. Pt immediately dives into conversation regarding progress with therapy, limitations of mobility due to symptoms, makes evident he does not have much recollection of previous day's session, but rather is making several references to the therapy session from earlier in the week. Pt reminded of events and conversations from previous day's PT session, some of which he does recall when reminded.  Orthostatic vitals established early in session, continue to demonstrate precipitous drop, but this date reveals some evidence of correction within minutes of standing, which has not previously been seen prior to onset of presyncope. Pt continues to be asymptomatic with BP drop, similar to yesterday, however mentation and alertness do not seem impaired as they were yesterday.    07/26/20 1530  Orthostatic Lying   BP- Lying 173/69  Pulse- Lying 56  Orthostatic Sitting  BP- Sitting 136/79  Pulse- Sitting 57  Orthostatic Standing at 0 minutes  BP- Standing at 0 minutes 97/60  Pulse- Standing at 0 minutes 64  Orthostatic Standing at 3 minutes  BP- Standing at 3 minutes 107/62 (taken at 1-minute (not 3))  Pulse- Standing at 3 minutes 65 (taken at 1-minute (not 3))   Pt performs bed mobility, transfers with modified independence. Pt given minGuard assist for AMB and prolonged standing (for safety). Pt has some mild unsteadiness of gait, has been largely immobile since arrival, however does not have same gait ataxia as seen yesterday. Pt able to AMB ~555ft without adverse  affect, chair follow utilized. Will continue to follow.     Follow Up Recommendations  Supervision for mobility/OOB;Supervision - Intermittent;Home health PT     Equipment Recommendations  None recommended by PT    Recommendations for Other Services       Precautions / Restrictions Precautions Precautions: Fall Precaution Comments: orthostatic hypotension Restrictions Weight Bearing Restrictions: No    Mobility  Bed Mobility Overal bed mobility: Independent                Transfers Overall transfer level: Modified independent Equipment used: None Transfers: Sit to/from Stand           General transfer comment: asymptomatic with BP drop 170s to 130s  Ambulation/Gait   Gait Distance (Feet): 480 Feet (chair follow. No symptoms) Assistive device: None Gait Pattern/deviations: WFL(Within Functional Limits)     General Gait Details: mild unsteadiness at times, otherwise no difficulty. Slower than baseline.   Stairs             Wheelchair Mobility    Modified Rankin (Stroke Patients Only)       Balance Overall balance assessment: Modified Independent;Mild deficits observed, not formally tested                                          Cognition Arousal/Alertness: Awake/alert Behavior During Therapy: Flat affect Overall Cognitive Status: Within Functional Limits for tasks assessed  General Comments: some impaired recall from previous day's PT session.      Exercises      General Comments        Pertinent Vitals/Pain Pain Assessment: No/denies pain    Home Living                      Prior Function            PT Goals (current goals can now be found in the care plan section) Acute Rehab PT Goals Patient Stated Goal: return to PLOF activity PT Goal Formulation: With patient Time For Goal Achievement: 08/06/20 Potential to Achieve Goals: Fair Progress  towards PT goals: Progressing toward goals    Frequency    Min 2X/week      PT Plan Current plan remains appropriate    Co-evaluation              AM-PAC PT "6 Clicks" Mobility   Outcome Measure  Help needed turning from your back to your side while in a flat bed without using bedrails?: None Help needed moving from lying on your back to sitting on the side of a flat bed without using bedrails?: None Help needed moving to and from a bed to a chair (including a wheelchair)?: A Little Help needed standing up from a chair using your arms (e.g., wheelchair or bedside chair)?: A Little Help needed to walk in hospital room?: A Little Help needed climbing 3-5 steps with a railing? : A Little 6 Click Score: 20    End of Session Equipment Utilized During Treatment: Gait belt Activity Tolerance: Patient tolerated treatment well;Patient limited by lethargy;Treatment limited secondary to medical complications (Comment) Patient left: in chair;with call bell/phone within reach;with family/visitor present Nurse Communication: Mobility status PT Visit Diagnosis: Other symptoms and signs involving the nervous system (R29.898);Dizziness and giddiness (R42)     Time: 4010-2725 PT Time Calculation (min) (ACUTE ONLY): 38 min  Charges:  $Therapeutic Exercise: 23-37 mins $Therapeutic Activity: 8-22 mins                     4:55 PM, 07/26/20 Etta Grandchild, PT, DPT Physical Therapist - [facility]  [phone] ([place])    Wallace Cogliano C 07/26/2020, 4:43 PM

## 2020-07-27 ENCOUNTER — Inpatient Hospital Stay: Payer: Medicare Other

## 2020-07-27 LAB — GLUCOSE, CAPILLARY
Glucose-Capillary: 154 mg/dL — ABNORMAL HIGH (ref 70–99)
Glucose-Capillary: 174 mg/dL — ABNORMAL HIGH (ref 70–99)
Glucose-Capillary: 222 mg/dL — ABNORMAL HIGH (ref 70–99)
Glucose-Capillary: 267 mg/dL — ABNORMAL HIGH (ref 70–99)
Glucose-Capillary: 97 mg/dL (ref 70–99)

## 2020-07-27 MED ORDER — MORPHINE SULFATE (PF) 2 MG/ML IV SOLN
2.0000 mg | Freq: Once | INTRAVENOUS | Status: AC
  Administered 2020-07-27: 23:00:00 2 mg via INTRAVENOUS
  Filled 2020-07-27: qty 1

## 2020-07-27 NOTE — Progress Notes (Signed)
Blood glucose is 197. Will attempt to recheck on different monitor to allow results to transfer into system

## 2020-07-27 NOTE — Care Management Important Message (Signed)
Important Message  Patient Details  Name: Jack Estrada MRN: [id number] Date of Birth: September 19, 1945   Medicare Important Message Given:  Yes     Juliann Pulse A [place] Mi 07/27/2020, 10:50 AM

## 2020-07-27 NOTE — TOC Progression Note (Signed)
Transition of Care [facility]) - Progression Note    Patient Details  Name: AMIRI RIECHERS MRN: [id number] Date of Birth: 26-Mar-1946  Transition of Care [facility]) CM/SW [place], LCSW Phone Number: 07/27/2020, 2:43 PM  Clinical Narrative:   CSW updated Joelene Millin with [facility] of possible discharge tomorrow per rounds.    Expected Discharge Plan: [place] Barriers to Discharge: Continued Medical Work up  Expected Discharge Plan and Services Expected Discharge Plan: [place] arrangements for the past 2 months: [place]: PT, OT [place] Agency: [facility] Date [place]: 07/26/20 Time [place]: [place] Representative spoke with at [place]: [place] ([place]) Interventions    Readmission Risk Interventions No flowsheet data found.

## 2020-07-27 NOTE — Progress Notes (Signed)
PROGRESS NOTE    Jack Estrada  OAC:[id number] DOB: 1946-01-20 DOA: 07/22/2020 PCP: Guadalupe Maple, MD     Brief Narrative:  Jack Estrada is a 74 y.o. male with medical history significant for hypertension, IDDM, depression/anxiety, history of penile condyloma acuminata initially thought as [facility] pathologist read as lichen planus) status post resection 10/19/19, hyperlipidemia, presented to the emergency department via EMS for chief concerns of syncopal episode.  New events last 24 hours / Subjective: Patient reported having nausea with an episode of vomiting, recurrent headaches, with significant dizziness.  Denied any chest pain, shortness of breath, abdominal pain, fever/chills.   Assessment & Plan:   Principal Problem:   Syncope Active Problems:   Insulin dependent type 2 diabetes mellitus (HCC)   Hyperlipidemia associated with type 2 diabetes mellitus ([place])   Hypertension associated with type 2 diabetes mellitus (HCC)   BPH (benign prostatic hyperplasia)   Depression, recurrent (HCC)   Anxiety   Concussion   Syncope Orthostatic hypotension Sinus bradycardia Patient with sinus bradycardia on telemetry and EKG Still with orthostatic hypotension Troponin negative Echocardiogram unremarkable Cardiology following, plan for 2-week Holter monitor placed prior to discharge Continue to hold amlodipine Daily orthostatic vital signs Continue IV fluids, compression stockings, abdominal binder Monitor on telemetry  Bilateral subdural hygroma Repeat CT showed interval development of bilateral posttraumatic subdural hygroma, no significant mass-effect Neurosurgery consulted  AKI on CKD stage IIIa Resolved Baseline creatinine 1.1, currently around baseline Renal ultrasound unremarkable Strict I's and O's, daily BMP  Diabetes mellitus type 2 with hyperglycemia Hemoglobin A1c 10.4 Continue Lantus, NovoLog, sliding scale  Hyperlipidemia Continue  Lipitor  Depression/anxiety Continue home Ativan, Abilify, fluoxetine  Posterior scalp laceration Present on admission, supportive care  Likely concussion after syncopal episode Supportive care   DVT prophylaxis:  enoxaparin (LOVENOX) injection 40 mg Start: 07/22/20 2200 Place TED hose Start: 07/22/20 1728  Code Status: Full code Family Communication: Discussed with wife over the phone on 07/26/20 with patients permission Disposition Plan:  Status is: Inpatient  The patient will require care spanning > 2 midnights and should be moved to inpatient because: Hemodynamically unstable and Inpatient level of care appropriate due to severity of illness  Dispo: The patient is from: Home              Anticipated d/c is to: Home              Anticipated d/c date is: 1 day              Patient currently is not medically stable to d/c.     Consultants:   Cardiology  Neurosurgery  Procedures:   None   Antimicrobials:  Anti-infectives (From admission, onward)   None       Objective: Vitals:   07/27/20 0536 07/27/20 0537 07/27/20 0738 07/27/20 1238  BP:  (!) 133/98 (!) 151/72 (!) 153/68  Pulse:  (!) 52 (!) 51 (!) 47  Resp:  15 17 20   Temp:  98.2 F (36.8 C) 97.9 F (36.6 C) 97.8 F (36.6 C)  TempSrc:   Oral   SpO2:  95% 96% 98%  Weight: 97.7 kg     Height:        Intake/Output Summary (Last 24 hours) at 07/27/2020 1542 Last data filed at 07/27/2020 0536 Gross per 24 hour  Intake 300 ml  Output 1150 ml  Net -850 ml   Filed Weights   07/22/20 1331 07/24/20 0500 07/27/20 0536  Weight: 98.9 kg  98.8 kg 97.7 kg    Examination:  General: NAD   Cardiovascular: S1, S2 present  Respiratory: CTAB  Abdomen: Soft, nontender, nondistended, bowel sounds present  Musculoskeletal: No bilateral pedal edema noted  Skin: Normal  Psychiatry: Normal mood   Data Reviewed: I have personally reviewed following labs and imaging studies  CBC: Recent Labs  Lab  07/22/20 1334 07/23/20 0436  WBC 7.9 7.6  NEUTROABS 3.8  --   HGB 13.7 13.5  HCT 39.4 40.0  MCV 87.4 90.1  PLT 149* 161*   Basic Metabolic Panel: Recent Labs  Lab 07/22/20 1334 07/23/20 0436 07/24/20 0441 07/25/20 0509  NA 134* 138 138 137  K 3.7 3.9 4.7 4.2  CL 99 99 102 103  CO2 19* 29 30 27   GLUCOSE 264* 145* 254* 244*  BUN 24* 20 24* 18  CREATININE 1.56* 1.39* 1.61* 1.18  CALCIUM 10.2 9.3 9.0 9.2  MG 1.6*  --   --   --    GFR: Estimated Creatinine Clearance: 68.5 mL/min (by C-G formula based on SCr of 1.18 mg/dL). Liver Function Tests: Recent Labs  Lab 07/22/20 1334  AST 25  ALT 26  ALKPHOS 78  BILITOT 1.2  PROT 7.1  ALBUMIN 3.7   No results for input(s): LIPASE, AMYLASE in the last 168 hours. No results for input(s): AMMONIA in the last 168 hours. Coagulation Profile: No results for input(s): INR, PROTIME in the last 168 hours. Cardiac Enzymes: No results for input(s): CKTOTAL, CKMB, CKMBINDEX, TROPONINI in the last 168 hours. BNP (last 3 results) No results for input(s): PROBNP in the last 8760 hours. HbA1C: No results for input(s): HGBA1C in the last 72 hours. CBG: Recent Labs  Lab 07/26/20 1119 07/26/20 1607 07/27/20 0015 07/27/20 0812 07/27/20 1232  GLUCAP 225* 169* 267* 222* 154*   Lipid Profile: No results for input(s): CHOL, HDL, LDLCALC, TRIG, CHOLHDL, LDLDIRECT in the last 72 hours. Thyroid Function Tests: No results for input(s): TSH, T4TOTAL, FREET4, T3FREE, THYROIDAB in the last 72 hours. Anemia Panel: No results for input(s): VITAMINB12, FOLATE, FERRITIN, TIBC, IRON, RETICCTPCT in the last 72 hours. Sepsis Labs: No results for input(s): PROCALCITON, LATICACIDVEN in the last 168 hours.  Recent Results (from the past 240 hour(s))  Respiratory Panel by RT PCR (Flu A&B, Covid) - Nasopharyngeal Swab     Status: None   Collection Time: 07/22/20  1:34 PM   Specimen: Nasopharyngeal Swab  Result Value Ref Range Status   SARS  Coronavirus 2 by RT PCR NEGATIVE NEGATIVE Final    Comment: (NOTE) SARS-CoV-2 target nucleic acids are NOT DETECTED.  The SARS-CoV-2 RNA is generally detectable in upper respiratoy specimens during the acute phase of infection. The lowest concentration of SARS-CoV-2 viral copies this assay can detect is 131 copies/mL. A negative result does not preclude SARS-Cov-2 infection and should not be used as the sole basis for treatment or other patient management decisions. A negative result may occur with  improper specimen collection/handling, submission of specimen other than nasopharyngeal swab, presence of viral mutation(s) within the areas targeted by this assay, and inadequate number of viral copies (<131 copies/mL). A negative result must be combined with clinical observations, patient history, and epidemiological information. The expected result is Negative.  Fact Sheet for Patients:  PinkCheek.be  Fact Sheet for Healthcare Providers:  GravelBags.it  This test is no t yet approved or cleared by the [place] FDA and  has been authorized for detection and/or diagnosis of SARS-CoV-2 by FDA under  an Emergency Use Authorization (EUA). This EUA will remain  in effect (meaning this test can be used) for the duration of the COVID-19 declaration under Section 564(b)(1) of the Act, 21 U.S.C. section 360bbb-3(b)(1), unless the authorization is terminated or revoked sooner.     Influenza A by PCR NEGATIVE NEGATIVE Final   Influenza B by PCR NEGATIVE NEGATIVE Final    Comment: (NOTE) The Xpert Xpress SARS-CoV-2/FLU/RSV assay is intended as an aid in  the diagnosis of influenza from Nasopharyngeal swab specimens and  should not be used as a sole basis for treatment. Nasal washings and  aspirates are unacceptable for Xpert Xpress SARS-CoV-2/FLU/RSV  testing.  Fact Sheet for  Patients: PinkCheek.be  Fact Sheet for Healthcare Providers: GravelBags.it  This test is not yet approved or cleared by the [place] FDA and  has been authorized for detection and/or diagnosis of SARS-CoV-2 by  FDA under an Emergency Use Authorization (EUA). This EUA will remain  in effect (meaning this test can be used) for the duration of the  Covid-19 declaration under Section 564(b)(1) of the Act, 21  U.S.C. section 360bbb-3(b)(1), unless the authorization is  terminated or revoked. Performed at [facility], [address], [place], [place] [address]       Radiology Studies: DG Cervical Spine Complete  Result Date: 07/27/2020 CLINICAL DATA:  Dizziness. EXAM: CERVICAL SPINE - COMPLETE 4+ VIEW COMPARISON:  None. FINDINGS: There is no evidence of cervical spine fracture or prevertebral soft tissue swelling. Alignment is normal. No other significant bone abnormalities are identified. IMPRESSION: Negative cervical spine radiographs. Electronically Signed   By: Marijo Conception M.D.   On: 07/27/2020 11:45   CT HEAD WO CONTRAST  Result Date: 07/27/2020 CLINICAL DATA:  74 year old male with a history of nausea and vomiting, recent concussion. Headache EXAM: CT HEAD WITHOUT CONTRAST TECHNIQUE: Contiguous axial images were obtained from the base of the skull through the vertex without intravenous contrast. COMPARISON:  07/22/2020 FINDINGS: Brain: Interval development of left-sided CSF density extra-axial fluid in the left para falcine space, tracking from the frontal region posteriorly along the parietal and medial occipital region. Greatest thickness estimated 3 mm towards the vertex. Fluid tracks posteriorly along the left tentorium, greatest thickness estimated 3 mm. Interval development of right-sided CSF density extra-axial fluid along the frontoparietal convexity, measuring 3 mm. No hyperdense hemorrhage.   Unremarkable ventricles. No significant mass effect. No midline shift at the third ventricle. Gray-white differentiation maintained. Vascular: Calcifications of the anterior circulation Skull: No acute fracture.  No aggressive bone lesion identified. Sinuses/Orbits: Unremarkable appearance of the orbits. Mastoid air cells clear. No middle ear effusion. No significant sinus disease. Other: None IMPRESSION: Interval development of bilateral posttraumatic subdural hygroma. On the right hygroma is layered over the frontoparietal convexity measuring 3 mm. On the left hygroma is para falcine and extends posteriorly over the tentorium, measuring 3 mm. Electronically Signed   By: Corrie Mckusick D.O.   On: 07/27/2020 12:23      Scheduled Meds: . ARIPiprazole  5 mg Oral Daily  . atorvastatin  40 mg Oral Daily  . enoxaparin (LOVENOX) injection  40 mg Subcutaneous Q24H  . famotidine  20 mg Oral BID  . FLUoxetine  40 mg Oral Daily  . influenza vaccine adjuvanted  0.5 mL Intramuscular Tomorrow-1000  . insulin aspart  0-15 Units Subcutaneous TID WC  . insulin aspart  0-5 Units Subcutaneous QHS  . insulin aspart  7 Units Subcutaneous TID WC  . insulin  glargine  30 Units Subcutaneous Daily  . polyethylene glycol  17 g Oral Daily  . senna-docusate  1 tablet Oral QHS  . sodium chloride flush  3 mL Intravenous Q12H   Continuous Infusions: . sodium chloride 100 mL/hr at 07/27/20 0842     LOS: 3 days        Alma Friendly, MD [facility] 07/27/2020, 3:42 PM

## 2020-07-27 NOTE — Progress Notes (Signed)
Pt stated he needed to get to [facility] for bm.  Attempted to sit patient up on side of bed.  Pt c/o dizziness and did not want to get up at this time.

## 2020-07-28 ENCOUNTER — Inpatient Hospital Stay: Payer: Medicare Other

## 2020-07-28 DIAGNOSIS — M542 Cervicalgia: Secondary | ICD-10-CM

## 2020-07-28 DIAGNOSIS — E860 Dehydration: Secondary | ICD-10-CM

## 2020-07-28 DIAGNOSIS — N179 Acute kidney failure, unspecified: Secondary | ICD-10-CM

## 2020-07-28 DIAGNOSIS — N400 Enlarged prostate without lower urinary tract symptoms: Secondary | ICD-10-CM

## 2020-07-28 LAB — BASIC METABOLIC PANEL
Anion gap: 6 (ref 5–15)
BUN: 12 mg/dL (ref 8–23)
CO2: 28 mmol/L (ref 22–32)
Calcium: 8.7 mg/dL — ABNORMAL LOW (ref 8.9–10.3)
Chloride: 101 mmol/L (ref 98–111)
Creatinine, Ser: 1.07 mg/dL (ref 0.61–1.24)
GFR, Estimated: >60 mL/min (ref >60)
Glucose, Bld: 173 mg/dL — ABNORMAL HIGH (ref 70–99)
Potassium: 3.8 mmol/L (ref 3.5–5.1)
Sodium: 135 mmol/L (ref 135–145)

## 2020-07-28 LAB — CBC WITH DIFFERENTIAL/PLATELET
Abs Immature Granulocytes: 0.01 10*3/uL (ref 0.00–0.07)
Basophils Absolute: 0 10*3/uL (ref 0.0–0.1)
Basophils Relative: 0 %
Eosinophils Absolute: 0.1 10*3/uL (ref 0.0–0.5)
Eosinophils Relative: 2 %
HCT: 38.3 % — ABNORMAL LOW (ref 39.0–52.0)
Hemoglobin: 13.2 g/dL (ref 13.0–17.0)
Immature Granulocytes: 0 %
Lymphocytes Relative: 28 %
Lymphs Abs: 1.7 10*3/uL (ref 0.7–4.0)
MCH: 30.3 pg (ref 26.0–34.0)
MCHC: 34.5 g/dL (ref 30.0–36.0)
MCV: 87.8 fL (ref 80.0–100.0)
Monocytes Absolute: 0.5 10*3/uL (ref 0.1–1.0)
Monocytes Relative: 9 %
Neutro Abs: 3.8 10*3/uL (ref 1.7–7.7)
Neutrophils Relative %: 61 %
Platelets: 150 10*3/uL (ref 150–400)
RBC: 4.36 MIL/uL (ref 4.22–5.81)
RDW: 12.5 % (ref 11.5–15.5)
WBC: 6.1 10*3/uL (ref 4.0–10.5)
nRBC: 0 % (ref 0.0–0.2)

## 2020-07-28 LAB — GLUCOSE, CAPILLARY
Glucose-Capillary: 164 mg/dL — ABNORMAL HIGH (ref 70–99)
Glucose-Capillary: 164 mg/dL — ABNORMAL HIGH (ref 70–99)
Glucose-Capillary: 204 mg/dL — ABNORMAL HIGH (ref 70–99)
Glucose-Capillary: 223 mg/dL — ABNORMAL HIGH (ref 70–99)

## 2020-07-28 MED ORDER — MECLIZINE HCL 12.5 MG PO TABS
12.5000 mg | ORAL_TABLET | Freq: Three times a day (TID) | ORAL | Status: DC
  Administered 2020-07-28 – 2020-07-29 (×4): 12.5 mg via ORAL
  Filled 2020-07-28 (×5): qty 1

## 2020-07-28 MED ORDER — PROMETHAZINE HCL 25 MG/ML IJ SOLN: 12.5000 mg | Freq: Three times a day (TID) | INTRAMUSCULAR | Status: DC | PRN

## 2020-07-28 NOTE — Progress Notes (Signed)
PROGRESS NOTE    Jack Estrada  DGL:[id number] DOB: 1946-03-19 DOA: 07/22/2020 PCP: Guadalupe Maple, MD     Brief Narrative:  Jack Estrada is a 74 y.o. male with medical history significant for hypertension, IDDM, depression/anxiety, history of penile condyloma acuminata initially thought as [facility] pathologist read as lichen planus) status post resection 10/19/19, hyperlipidemia, presented to the emergency department via EMS for chief concerns of syncopal episode.  New events last 24 hours / Subjective: Patient continues to have persistent dizziness worse with movements, nausea and vomiting, headache and neck pain.  Denies any chest pain, abdominal pain, shortness of breath.   Assessment & Plan:   Principal Problem:   Syncope Active Problems:   Insulin dependent type 2 diabetes mellitus (HCC)   Hyperlipidemia associated with type 2 diabetes mellitus ([place])   Hypertension associated with type 2 diabetes mellitus (HCC)   BPH (benign prostatic hyperplasia)   Depression, recurrent (HCC)   Anxiety   Concussion   Syncope Orthostatic hypotension Sinus bradycardia ?? Vertigo Patient with sinus bradycardia on telemetry and EKG Still with orthostatic hypotension Troponin negative Echocardiogram unremarkable Cardiology following, plan for 2-week Holter monitor placed prior to discharge Repeat CT showed interval development of bilateral posttraumatic subdural hygroma MRA neck, MRI brain showed no acute intracranial finding, noted small subdural effusion, 4 mm on the right and 2.5 mm on the left, no signs of acute or subacute hemorrhage Start p.o. meclizine PT for vestibular testing Continue to hold amlodipine Daily orthostatic vital signs Continue IV fluids, compression stockings, abdominal binder Monitor on telemetry  Bilateral subdural hygroma Repeat CT showed interval development of bilateral posttraumatic subdural hygroma, no significant mass-effect Neurosurgery  consulted-spoke to Dr. Lacinda Axon on 07/27/2020, no intervention needed, as hygroma is less than 1 cm.  Plan for outpatient follow-up with repeat scan in 2 to 3 weeks post discharge  AKI on CKD stage IIIa Resolved Baseline creatinine 1.1, currently around baseline Renal ultrasound unremarkable Strict I's and O's, daily BMP  Diabetes mellitus type 2 with hyperglycemia Hemoglobin A1c 10.4 Continue Lantus, NovoLog, sliding scale  Hyperlipidemia Continue Lipitor  Depression/anxiety Continue home Ativan, Abilify, fluoxetine  Posterior scalp laceration Present on admission, supportive care  Likely concussion after syncopal episode Supportive care   DVT prophylaxis:  enoxaparin (LOVENOX) injection 40 mg Start: 07/22/20 2200 Place TED hose Start: 07/22/20 1728  Code Status: Full code Family Communication: Discussed with wife over the phone on 07/26/20 with patients permission Disposition Plan:  Status is: Inpatient  The patient will require care spanning > 2 midnights and should be moved to inpatient because: Hemodynamically unstable and Inpatient level of care appropriate due to severity of illness  Dispo: The patient is from: Home              Anticipated d/c is to: Home              Anticipated d/c date is: 2 days              Patient currently is not medically stable to d/c.     Consultants:   Cardiology  Neurosurgery  Procedures:   None   Antimicrobials:  Anti-infectives (From admission, onward)   None       Objective: Vitals:   07/27/20 2357 07/28/20 0454 07/28/20 0926 07/28/20 1230  BP: (!) 153/78 (!) 153/71 137/61 (!) 166/69  Pulse: (!) 55 (!) 52 (!) 49 (!) 52  Resp: 18 16 20 19   Temp: 98.2 F (36.8 C) 97.9  F (36.6 C) 98.1 F (36.7 C) 97.9 F (36.6 C)  TempSrc: Oral Oral Oral Oral  SpO2: 98% 95% 96% 100%  Weight:      Height:        Intake/Output Summary (Last 24 hours) at 07/28/2020 1356 Last data filed at 07/28/2020 1032 Gross per 24 hour    Intake 240 ml  Output 700 ml  Net -460 ml   Filed Weights   07/22/20 1331 07/24/20 0500 07/27/20 0536  Weight: 98.9 kg 98.8 kg 97.7 kg    Examination:  General:  Mild distress due to dizziness  Cardiovascular: S1, S2 present  Respiratory: CTAB  Abdomen: Soft, nontender, nondistended, bowel sounds present  Musculoskeletal: No bilateral pedal edema noted  Skin: Normal  Psychiatry: Normal mood  Neurology: No obvious focal neurologic deficit noted   Data Reviewed: I have personally reviewed following labs and imaging studies  CBC: Recent Labs  Lab 07/22/20 1334 07/23/20 0436 07/28/20 0444  WBC 7.9 7.6 6.1  NEUTROABS 3.8  --  3.8  HGB 13.7 13.5 13.2  HCT 39.4 40.0 38.3*  MCV 87.4 90.1 87.8  PLT 149* 145* 193   Basic Metabolic Panel: Recent Labs  Lab 07/22/20 1334 07/23/20 0436 07/24/20 0441 07/25/20 0509 07/28/20 0444  NA 134* 138 138 137 135  K 3.7 3.9 4.7 4.2 3.8  CL 99 99 102 103 101  CO2 19* 29 30 27 28   GLUCOSE 264* 145* 254* 244* 173*  BUN 24* 20 24* 18 12  CREATININE 1.56* 1.39* 1.61* 1.18 1.07  CALCIUM 10.2 9.3 9.0 9.2 8.7*  MG 1.6*  --   --   --   --    GFR: Estimated Creatinine Clearance: 75.6 mL/min (by C-G formula based on SCr of 1.07 mg/dL). Liver Function Tests: Recent Labs  Lab 07/22/20 1334  AST 25  ALT 26  ALKPHOS 78  BILITOT 1.2  PROT 7.1  ALBUMIN 3.7   No results for input(s): LIPASE, AMYLASE in the last 168 hours. No results for input(s): AMMONIA in the last 168 hours. Coagulation Profile: No results for input(s): INR, PROTIME in the last 168 hours. Cardiac Enzymes: No results for input(s): CKTOTAL, CKMB, CKMBINDEX, TROPONINI in the last 168 hours. BNP (last 3 results) No results for input(s): PROBNP in the last 8760 hours. HbA1C: No results for input(s): HGBA1C in the last 72 hours. CBG: Recent Labs  Lab 07/27/20 1232 07/27/20 1718 07/27/20 2055 07/28/20 0929 07/28/20 1237  GLUCAP 154* 97 174* 164* 164*    Lipid Profile: No results for input(s): CHOL, HDL, LDLCALC, TRIG, CHOLHDL, LDLDIRECT in the last 72 hours. Thyroid Function Tests: No results for input(s): TSH, T4TOTAL, FREET4, T3FREE, THYROIDAB in the last 72 hours. Anemia Panel: No results for input(s): VITAMINB12, FOLATE, FERRITIN, TIBC, IRON, RETICCTPCT in the last 72 hours. Sepsis Labs: No results for input(s): PROCALCITON, LATICACIDVEN in the last 168 hours.  Recent Results (from the past 240 hour(s))  Respiratory Panel by RT PCR (Flu A&B, Covid) - Nasopharyngeal Swab     Status: None   Collection Time: 07/22/20  1:34 PM   Specimen: Nasopharyngeal Swab  Result Value Ref Range Status   SARS Coronavirus 2 by RT PCR NEGATIVE NEGATIVE Final    Comment: (NOTE) SARS-CoV-2 target nucleic acids are NOT DETECTED.  The SARS-CoV-2 RNA is generally detectable in upper respiratoy specimens during the acute phase of infection. The lowest concentration of SARS-CoV-2 viral copies this assay can detect is 131 copies/mL. A negative result does not  preclude SARS-Cov-2 infection and should not be used as the sole basis for treatment or other patient management decisions. A negative result may occur with  improper specimen collection/handling, submission of specimen other than nasopharyngeal swab, presence of viral mutation(s) within the areas targeted by this assay, and inadequate number of viral copies (<131 copies/mL). A negative result must be combined with clinical observations, patient history, and epidemiological information. The expected result is Negative.  Fact Sheet for Patients:  PinkCheek.be  Fact Sheet for Healthcare Providers:  GravelBags.it  This test is no t yet approved or cleared by the [place] FDA and  has been authorized for detection and/or diagnosis of SARS-CoV-2 by FDA under an Emergency Use Authorization (EUA). This EUA will remain  in effect  (meaning this test can be used) for the duration of the COVID-19 declaration under Section 564(b)(1) of the Act, 21 U.S.C. section 360bbb-3(b)(1), unless the authorization is terminated or revoked sooner.     Influenza A by PCR NEGATIVE NEGATIVE Final   Influenza B by PCR NEGATIVE NEGATIVE Final    Comment: (NOTE) The Xpert Xpress SARS-CoV-2/FLU/RSV assay is intended as an aid in  the diagnosis of influenza from Nasopharyngeal swab specimens and  should not be used as a sole basis for treatment. Nasal washings and  aspirates are unacceptable for Xpert Xpress SARS-CoV-2/FLU/RSV  testing.  Fact Sheet for Patients: PinkCheek.be  Fact Sheet for Healthcare Providers: GravelBags.it  This test is not yet approved or cleared by the [place] FDA and  has been authorized for detection and/or diagnosis of SARS-CoV-2 by  FDA under an Emergency Use Authorization (EUA). This EUA will remain  in effect (meaning this test can be used) for the duration of the  Covid-19 declaration under Section 564(b)(1) of the Act, 21  U.S.C. section 360bbb-3(b)(1), unless the authorization is  terminated or revoked. Performed at [facility], [address] [place], [place] [address]       Radiology Studies: DG Cervical Spine Complete  Result Date: 07/27/2020 CLINICAL DATA:  Dizziness. EXAM: CERVICAL SPINE - COMPLETE 4+ VIEW COMPARISON:  None. FINDINGS: There is no evidence of cervical spine fracture or prevertebral soft tissue swelling. Alignment is normal. No other significant bone abnormalities are identified. IMPRESSION: Negative cervical spine radiographs. Electronically Signed   By: Marijo Conception M.D.   On: 07/27/2020 11:45   CT HEAD WO CONTRAST  Result Date: 07/27/2020 CLINICAL DATA:  74 year old male with a history of nausea and vomiting, recent concussion. Headache EXAM: CT HEAD WITHOUT CONTRAST TECHNIQUE: Contiguous axial  images were obtained from the base of the skull through the vertex without intravenous contrast. COMPARISON:  07/22/2020 FINDINGS: Brain: Interval development of left-sided CSF density extra-axial fluid in the left para falcine space, tracking from the frontal region posteriorly along the parietal and medial occipital region. Greatest thickness estimated 3 mm towards the vertex. Fluid tracks posteriorly along the left tentorium, greatest thickness estimated 3 mm. Interval development of right-sided CSF density extra-axial fluid along the frontoparietal convexity, measuring 3 mm. No hyperdense hemorrhage.  Unremarkable ventricles. No significant mass effect. No midline shift at the third ventricle. Gray-white differentiation maintained. Vascular: Calcifications of the anterior circulation Skull: No acute fracture.  No aggressive bone lesion identified. Sinuses/Orbits: Unremarkable appearance of the orbits. Mastoid air cells clear. No middle ear effusion. No significant sinus disease. Other: None IMPRESSION: Interval development of bilateral posttraumatic subdural hygroma. On the right hygroma is layered over the frontoparietal convexity measuring 3 mm. On the  left hygroma is para falcine and extends posteriorly over the tentorium, measuring 3 mm. Electronically Signed   By: Corrie Mckusick D.O.   On: 07/27/2020 12:23   MR ANGIO NECK WO CONTRAST  Result Date: 07/28/2020 CLINICAL DATA:  Recurrent syncope. Depression and anxiety. Bradycardia and orthostasis. EXAM: MRI HEAD WITHOUT CONTRAST MRA neck WITHOUT CONTRAST TECHNIQUE: Multiplanar, multiecho pulse sequences of the brain and surrounding structures were obtained without intravenous contrast. Angiographic images of the neck were obtained using MRA technique without contrast. COMPARISON:  07/27/2020 FINDINGS: MRI HEAD FINDINGS Brain: Diffusion imaging does not show any acute or subacute infarction. Mild chronic small-vessel ischemic change affects the pons. No  focal cerebellar insult. Cerebral hemispheres show moderate chronic small-vessel ischemic changes of the deep and subcortical white matter. No cortical or large vessel territory infarction. No mass, hemorrhage or hydrocephalus. Small subdural effusions, maximal thickness 4 mm on the right and 2.5 mm on the left. Vascular: Major vessels at the base of the brain show flow. Skull and upper cervical spine: Negative Sinuses/Orbits: Clear/normal.  Previous lens implants. Other: None MRA neck FINDINGS Both distal common carotid arteries widely patent. Both carotid bifurcations appear normal without stenosis or irregularity. Antegrade flow present in the midportion of both vertebral arteries. Vessel origins not evaluated using noncontrast technique. IMPRESSION: 1. No acute intracranial finding. Moderate chronic small-vessel ischemic changes of the cerebral hemispheric white matter and pons. 2. Small subdural effusions, maximal thickness 4 mm on the right and 2.5 mm on the left. No sign of acute or subacute hemorrhage. 3. No evidence of carotid bifurcation stenosis or irregularity. Antegrade flow present in the vertebral arteries. Electronically Signed   By: Nelson Chimes M.D.   On: 07/28/2020 08:49   MR BRAIN WO CONTRAST  Result Date: 07/28/2020 CLINICAL DATA:  Recurrent syncope. Depression and anxiety. Bradycardia and orthostasis. EXAM: MRI HEAD WITHOUT CONTRAST MRA neck WITHOUT CONTRAST TECHNIQUE: Multiplanar, multiecho pulse sequences of the brain and surrounding structures were obtained without intravenous contrast. Angiographic images of the neck were obtained using MRA technique without contrast. COMPARISON:  07/27/2020 FINDINGS: MRI HEAD FINDINGS Brain: Diffusion imaging does not show any acute or subacute infarction. Mild chronic small-vessel ischemic change affects the pons. No focal cerebellar insult. Cerebral hemispheres show moderate chronic small-vessel ischemic changes of the deep and subcortical white  matter. No cortical or large vessel territory infarction. No mass, hemorrhage or hydrocephalus. Small subdural effusions, maximal thickness 4 mm on the right and 2.5 mm on the left. Vascular: Major vessels at the base of the brain show flow. Skull and upper cervical spine: Negative Sinuses/Orbits: Clear/normal.  Previous lens implants. Other: None MRA neck FINDINGS Both distal common carotid arteries widely patent. Both carotid bifurcations appear normal without stenosis or irregularity. Antegrade flow present in the midportion of both vertebral arteries. Vessel origins not evaluated using noncontrast technique. IMPRESSION: 1. No acute intracranial finding. Moderate chronic small-vessel ischemic changes of the cerebral hemispheric white matter and pons. 2. Small subdural effusions, maximal thickness 4 mm on the right and 2.5 mm on the left. No sign of acute or subacute hemorrhage. 3. No evidence of carotid bifurcation stenosis or irregularity. Antegrade flow present in the vertebral arteries. Electronically Signed   By: Nelson Chimes M.D.   On: 07/28/2020 08:49      Scheduled Meds: . ARIPiprazole  5 mg Oral Daily  . atorvastatin  40 mg Oral Daily  . enoxaparin (LOVENOX) injection  40 mg Subcutaneous Q24H  . famotidine  20 mg Oral  BID  . FLUoxetine  40 mg Oral Daily  . influenza vaccine adjuvanted  0.5 mL Intramuscular Tomorrow-1000  . insulin aspart  0-15 Units Subcutaneous TID WC  . insulin aspart  0-5 Units Subcutaneous QHS  . insulin aspart  7 Units Subcutaneous TID WC  . insulin glargine  30 Units Subcutaneous Daily  . meclizine  12.5 mg Oral TID  . polyethylene glycol  17 g Oral Daily  . senna-docusate  1 tablet Oral QHS  . sodium chloride flush  3 mL Intravenous Q12H   Continuous Infusions: . sodium chloride 100 mL/hr at 07/28/20 0313     LOS: 4 days     Alma Friendly, MD [facility] 07/28/2020, 1:56 PM

## 2020-07-28 NOTE — Progress Notes (Signed)
Pt off floor to ordered MRI via hospital bed accompanied by transport.  IVF medlocked for transport.  Attempted to notify tele, no answer

## 2020-07-28 NOTE — Plan of Care (Signed)
  Problem: Clinical Measurements: Goal: Ability to maintain clinical measurements within normal limits will improve Outcome: Progressing   Problem: Clinical Measurements: Goal: Will remain free from infection Outcome: Progressing   Problem: Pain Managment: Goal: General experience of comfort will improve Outcome: Progressing   Problem: Education: Goal: Knowledge of condition and prescribed therapy will improve Outcome: Progressing   Problem: Physical Regulation: Goal: Complications related to the disease process, condition or treatment will be avoided or minimized Outcome: Not Progressing Patient nauseated with vomiting this shift, PRN zofran given x2  with improvement.

## 2020-07-28 NOTE — Progress Notes (Signed)
Patient alert and oriented, nauseated with vomiting this shift, zofran x2 given with improvement. Complained of back pain 10/10, morphine IV once given per hospitalist with improvement, will continue to monitor,

## 2020-07-28 NOTE — Progress Notes (Addendum)
° ° °  BRIEF OVERNIGHT PROGRESS REPORT  SUBJECTIVE: Called to the bedside to assess patient for worsening intractable nausea and vomiting associated with persistent dizziness. Patient state dizziness worse with turning his head sideways. No reports of associated speech or vision deficit, no focal motor or sensory deficit.  OBJECTIVE: On arrival to the bedside, he was afebrile with blood pressure 153/75 mm Hg and pulse rate 51 beats/min. There were no focal neurological deficits; he was alert and oriented x4, and he did not demonstrate any memory deficits.    ASSESSMENT & PLAN:  74 y.o male with PMH of HTN, DM type 2, Depression and anxiety admitted with syncopal episode and found to be bradycardic with orthostatic hypotension. Initial CT head neg, repeat CT showed bilateral subdural hygromas. Now with persistent dizziness, intractable nausea and vomiting.  Syncope and collapse  Now with persistent dizziness and intractable n/v. Work up so far with serial cardiac enzymes, and TTEcho, CXR and electrolytes non revealing - Orthostatic Vital Signs ? Neurologic (Transient Ischemic Attack (TIA), Seizure, Intermittent Pressure Hydrocephalus, Subclavian Steal Syndrome, Vertebrobasilar insufficiency) - Head CT initially negative, repeat shows postraumatic bilateral subdural hydromas - EEG would be a consideration however have very low suspicion of seizures in the absence of symptoms for which resolution would be delayed. - Will obtain MRI/MRA for better characterization -PRN antiemetics - Cardiology following, Plan Discharge to follow on outpatient for rhythm monitor (e.g., Holter or loop recorder)     Rufina Falco, DNP, CCRN, FNP-BC [facility] Nurse Practitioner Between 7pm to [place] - Pager [phone]  After 7am go to [email or web address] - [email or web address]  [facility] [employer]  [phone]

## 2020-07-28 NOTE — Progress Notes (Signed)
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 Pt has prior history of syncope several years ago. Pt reports dizziness and nausea earlier in day and previous day. Pt full independent at baseline. On 11/13 while admitted, pt had multiple episodes of N/V tirggered by Rt head turns in bed overnight. Workup only revelaing for subdural hygroma without mass effect.    PT Comments    Pt in bed upon entry, RN doing vitals check. Author arriving to perform Vestibular Evaluation after overnight episode of N/V in bed with triggering head movements.  Of note pt reports impaction of cerumen on Right side, treated medically ~2 weeks ago with good results, no overt infection, but prophylactic ABX, denies hearing loss. Pt also has a fall PTA ~6 days ago hitting head after syncope in the community. Pt mentioned some vertigo associated with Rt cervical rotation earlier in week during PT session, but could not be reproduced during cervical screening at that time. Pt reports multiple episodes of vertigo, N/V overnight associated with Right cervical rotation in a flat bed, episodes not lasting >60seconds.  BPPV screening this date: -Roll Test for [employer] BPPV: negative bilaterally -Dix-Hallpike Test for posterior canal BPPV: positive Rt Estrada upbeating torsional nystagmus and symptoms provocation; mildly provocative on Left with very short and minimal nystagmus.  Treatment: Epley Maneuver for Rt posterior canal BPPV performed. Pt moderately symptomatic, but tolerated well. Pt encouraged to sit up in recliner at end of session, he is agreeable. Anticipate a period or low level nausea for next 12-24 hours, better tolerance to head turns in bed. Will continue to follow.      Follow Up Recommendations  Supervision for  mobility/OOB;Supervision - Intermittent;Home health PT     Equipment Recommendations  None recommended by PT    Recommendations for Other Services       Precautions / Restrictions Precautions Precautions: Fall Precaution Comments: orthostatic hypotension Restrictions Weight Bearing Restrictions: No    Mobility  Bed Mobility Overal bed mobility: Modified Independent             General bed mobility comments: mostly for vestibular testing and treatment  Transfers                    Ambulation/Gait                 Stairs             Wheelchair Mobility    Modified Rankin (Stroke Patients Only)       Balance                                            Cognition Arousal/Alertness: Awake/alert Behavior During Therapy: WFL for tasks assessed/performed Overall Cognitive Status: Within Functional Limits for tasks assessed                                        Exercises      General Comments        Pertinent Vitals/Pain Pain Assessment: No/denies pain    Home Living  Prior Function            PT Goals (current goals can now be found in the care plan section) Acute Rehab PT Goals Patient Stated Goal: return to PLOF activity PT Goal Formulation: With patient Time For Goal Achievement: 08/06/20 Potential to Achieve Goals: Fair Progress towards PT goals: Progressing toward goals    Frequency    Min 2X/week      PT Plan Current plan remains appropriate    Co-evaluation              AM-PAC PT "6 Clicks" Mobility   Outcome Measure  Help needed turning from your back to your side while in a flat bed without using bedrails?: None Help needed moving from lying on your back to sitting on the side of a flat bed without using bedrails?: None Help needed moving to and from a bed to a chair (including a wheelchair)?: A Little Help needed standing up from a  chair using your arms (e.g., wheelchair or bedside chair)?: A Little Help needed to walk in hospital room?: A Little Help needed climbing 3-5 steps with a railing? : A Little 6 Click Score: 20    End of Session Equipment Utilized During Treatment: Gait belt Activity Tolerance: Patient tolerated treatment well;Patient limited by lethargy;Treatment limited secondary to medical complications (Comment) Patient left: in chair;with call bell/phone within reach;with family/visitor present Nurse Communication: Mobility status PT Visit Diagnosis: Other symptoms and signs involving the nervous system (R29.898);Dizziness and giddiness (R42)     Time: 1530-1610 PT Time Calculation (min) (ACUTE ONLY): 40 min  Charges:  $[place] Rep Proc: 8-22 mins                     5:50 PM, 07/28/20 Jack Estrada, PT, DPT Physical Therapist - [facility]  [phone] ([place])    Jack Estrada 07/28/2020, 5:40 PM

## 2020-07-29 DIAGNOSIS — H8111 Benign paroxysmal vertigo, right ear: Secondary | ICD-10-CM

## 2020-07-29 LAB — GLUCOSE, CAPILLARY
Glucose-Capillary: 195 mg/dL — ABNORMAL HIGH (ref 70–99)
Glucose-Capillary: 192 mg/dL — ABNORMAL HIGH (ref 70–99)
Glucose-Capillary: 242 mg/dL — ABNORMAL HIGH (ref 70–99)

## 2020-07-29 MED ORDER — MECLIZINE HCL 12.5 MG PO TABS
12.5000 mg | ORAL_TABLET | Freq: Three times a day (TID) | ORAL | 0 refills | Status: DC
Start: 2020-07-29 — End: 2020-12-10

## 2020-07-29 MED ORDER — ONDANSETRON HCL 4 MG PO TABS: 4.0000 mg | ORAL_TABLET | Freq: Three times a day (TID) | ORAL | 0 refills | Status: DC | PRN

## 2020-07-29 NOTE — Progress Notes (Signed)
Pt is requesting to see a MD for update. States she has not seen a MD for tow days and he has not been updated on his plan of care. Notified MD

## 2020-07-29 NOTE — Discharge Summary (Signed)
Discharge Summary  Jack Estrada TLX:[id number] DOB: [birth year]  PCP: Guadalupe Maple, MD  Admit date: 07/22/2020 Discharge date: 07/29/2020  Time spent: 45 mins  Recommendations for Outpatient Follow-up:  1. PCP in 1 week 2. Cardiology for Holter monitor placement on 07/30/2020 at 10 AM 3. Neurosurgery in 2 to 3 weeks for repeat CT head    Discharge Diagnoses:  Active Hospital Problems   Diagnosis Date Noted  . Syncope 07/22/2020  . Concussion 07/23/2020  . Anxiety 05/09/2019  . Depression, recurrent ([place]) 01/31/2019  . BPH (benign prostatic hyperplasia) 07/09/2015  . Hypertension associated with type 2 diabetes mellitus ([place]) 07/09/2015  . Hyperlipidemia associated with type 2 diabetes mellitus ([place])   . Insulin dependent type 2 diabetes mellitus [facility])     Resolved Hospital Problems  No resolved problems to display.    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    Today, met patient sitting up in bed having breakfast, still reports some very slight dizziness but much significantly better from yesterday, denies any nausea, denies any vomiting, headache, abdominal pain, fever/chills.  Patient was able to tolerate breakfast and lunch today without any nausea vomiting.  Is able to ambulate to the bathroom and back without any dizziness.  Patient very eager to be discharged.  Very frustrated about the nursing staff today, had multiple  complaints.   Hospital Course:  Principal Problem:   Syncope Active Problems:   Insulin dependent type 2 diabetes mellitus ([place])   Hyperlipidemia associated with type 2 diabetes mellitus ([place])   Hypertension associated with type 2 diabetes mellitus ([facility])   BPH (benign prostatic hyperplasia)   Depression, recurrent ([facility])   Anxiety   Concussion   Syncope Orthostatic hypotension Sinus bradycardia Patient with sinus bradycardia on telemetry and EKG Troponin negative Echocardiogram unremarkable Repeat CT showed interval development of bilateral posttraumatic subdural hygroma MRA neck, MRI brain showed no acute intracranial finding, noted small subdural effusion, 4 mm on the right and 2.5 mm on the left, no signs of acute or subacute hemorrhage Continue to hold amlodipine Outpatient cardiology follow-up for 2-week Holter monitor placement Advised to stay hydrated, caution on rising from a seated position, ordered compression stockings, may need abdominal binder if compression stockings is not sufficient Follow-up with PCP in 1 week  BPPV Patient Dix-Hallpike test done by PT on 07/28/2020 positive for posterior canal BPPV, worse on the right Performed Epley's maneuver, with noted significant improvement Continue p.o. meclizine, antiemetics as needed Follow-up with PCP  Bilateral subdural hygroma Repeat CT showed interval development of bilateral posttraumatic subdural hygroma, no significant mass-effect Neurosurgery consulted-spoke to Dr. Lacinda Axon on 07/27/2020, no intervention needed, as hygroma is less than 1 cm. Outpatient follow-up with repeat scan in 2 to 3 weeks post discharge  AKI on CKD stage IIIa Resolved Baseline creatinine 1.1, currently around baseline Renal ultrasound unremarkable  Diabetes mellitus type 2 with hyperglycemia Hemoglobin A1c 10.4 Continue home insulin regimen  Hyperlipidemia Continue Lipitor  Depression/anxiety Continue home  fluoxetine  Posterior scalp laceration Present on admission, supportive care  Likely concussion after syncopal episode Supportive care        Malnutrition Type:      Malnutrition Characteristics:      Nutrition Interventions:      Estimated body mass index is 29.21 kg/m as calculated from the following:   Height as of this encounter: 6' (1.829 m).   Weight as of this encounter: 97.7 kg.    Procedures: Epley's maneuver by PT on 07/28/2020  Consultations:  Cardiology  Discussed with neurosurgery    Discharge Exam: BP 139/83 (BP Location: Right Arm)   Pulse 60   Temp 97.9 F (36.6 C) (Oral)   Resp 18   Ht 6' (1.829 m)   Wt 97.7 kg   SpO2 98%   BMI 29.21 kg/m   General: NAD Cardiovascular: S1, S2 present Respiratory: CTA B Neurology: No obvious focal neurologic deficits noted     Discharge Instructions You were cared for by a hospitalist during your hospital stay. If you have any questions about your discharge medications or the care you received while you were in the hospital after you are discharged, you can call the unit and asked to speak with the hospitalist on call if the hospitalist that took care of you is not available. Once you are discharged, your primary care physician will handle any further medical issues. Please note that NO REFILLS for any discharge medications will be authorized once you are discharged, as it is imperative that you return to your primary care physician (or establish a relationship with a primary care physician if you do not have one) for your aftercare needs so that they can reassess your need for medications and monitor your lab values.  Discharge Instructions    Diet Carb Modified   Complete by: As directed    Increase activity slowly   Complete by: As directed      Allergies as of 07/29/2020      Reactions   Benazepril Cough      Medication List    STOP taking these medications   amLODipine 5 MG  tablet Commonly known as: NORVASC     TAKE these medications   atorvastatin 40 MG tablet Commonly known as: LIPITOR Take 1 tablet (40 mg total) by mouth daily.   FLUoxetine 40 MG capsule Commonly known as: PROZAC Take 1 capsule (40 mg total) by mouth daily.   insulin lispro 100 UNIT/ML KwikPen Commonly known as: HumaLOG KwikPen Inject 0.2-0.3 mLs (20-30 Units total) into the skin 3 (three) times daily. What changed: how much to take   Lantus SoloStar 100 UNIT/ML Solostar Pen Generic drug: insulin glargine Inject 20-30 Units into the skin daily.   meclizine 12.5 MG tablet Commonly known as: ANTIVERT Take 1 tablet (12.5 mg total) by mouth 3 (three) times daily.   ondansetron 4 MG tablet Commonly known as: ZOFRAN Take 1 tablet (4 mg total) by mouth every 8 (eight) hours as needed for nausea or vomiting.      Allergies  Allergen Reactions  . Benazepril Cough    Follow-up Information    Guadalupe Maple, MD. Schedule an appointment as soon as possible for a visit in 1 week(s).   Specialty: Family Medicine Contact information: [place] [address] [phone]        Yolonda Kida, MD Follow up.   Specialties: Cardiology, Internal Medicine Why: Please go to the office on 07/30/20 at 10am for a holter  monitor to be placed to monitor your heart rythm Contact information: [place] [address] [phone]        Deetta Perla, MD. Schedule an appointment as soon as possible for a visit.   Specialty: Neurosurgery Why: Call to make appointment to see this neurosurgeon for a repeat CT head in 2-3 weeks Contact information: [place] [address] [phone]                The results of significant diagnostics from this hospitalization (including imaging, microbiology, ancillary and laboratory) are listed below for reference.    Significant Diagnostic Studies: DG Chest 1 View  Result Date:  07/22/2020 CLINICAL DATA:  Syncope, fell EXAM: CHEST  1 VIEW COMPARISON:  10/15/2016 FINDINGS: The heart size and mediastinal contours are within normal limits. Both lungs are clear. The visualized skeletal structures are unremarkable. IMPRESSION: No active disease. Electronically Signed   By: Randa Ngo M.D.   On: 07/22/2020 15:17   DG Cervical Spine Complete  Result Date: 07/27/2020 CLINICAL DATA:  Dizziness. EXAM: CERVICAL SPINE - COMPLETE 4+ VIEW COMPARISON:  None. FINDINGS: There is no evidence of cervical spine fracture or prevertebral soft tissue swelling. Alignment is normal. No other significant bone abnormalities are identified. IMPRESSION: Negative cervical spine radiographs. Electronically Signed   By: Marijo Conception M.D.   On: 07/27/2020 11:45   CT HEAD WO CONTRAST  Result Date: 07/27/2020 CLINICAL DATA:  74 year old male with a history of nausea and vomiting, recent concussion. Headache EXAM: CT HEAD WITHOUT CONTRAST TECHNIQUE: Contiguous axial images were obtained from the base of the skull through the vertex without intravenous contrast. COMPARISON:  07/22/2020 FINDINGS: Brain: Interval development of left-sided CSF density extra-axial fluid in the left para falcine space, tracking from the frontal region posteriorly along the parietal and medial occipital region. Greatest thickness estimated 3 mm towards the vertex. Fluid tracks posteriorly along the left tentorium, greatest thickness estimated 3 mm. Interval development of right-sided CSF density extra-axial fluid along the frontoparietal convexity, measuring 3 mm. No hyperdense hemorrhage.  Unremarkable ventricles. No significant mass effect. No midline shift at the third ventricle. Gray-white differentiation maintained. Vascular: Calcifications of the anterior circulation Skull: No acute fracture.  No aggressive bone lesion identified. Sinuses/Orbits: Unremarkable appearance of the orbits. Mastoid air cells clear. No middle ear  effusion. No significant sinus disease. Other: None IMPRESSION: Interval development of bilateral posttraumatic subdural hygroma. On the right hygroma is layered over the frontoparietal convexity measuring 3 mm. On the left hygroma is para falcine and extends posteriorly over the tentorium, measuring 3 mm. Electronically Signed   By: Corrie Mckusick D.O.   On: 07/27/2020 12:23   CT Head Wo Contrast  Result Date: 07/22/2020 CLINICAL DATA:  74 year old male with history of syncopal episode. Fall with injury to the head and neck. EXAM: CT HEAD WITHOUT CONTRAST CT CERVICAL SPINE WITHOUT CONTRAST TECHNIQUE: Multidetector CT imaging of the head and cervical spine was performed following the standard protocol without intravenous contrast. Multiplanar CT image reconstructions of the cervical spine were also generated. COMPARISON:  No priors. FINDINGS: CT HEAD FINDINGS Brain: Patchy and confluent areas of decreased attenuation are noted throughout the deep and periventricular white matter of the cerebral hemispheres bilaterally, compatible with chronic microvascular ischemic disease. No evidence of acute infarction, hemorrhage, hydrocephalus, extra-axial collection or mass lesion/mass effect. Vascular: No hyperdense vessel or unexpected calcification. Skull: Normal. Negative for fracture or focal lesion. Sinuses/Orbits: No acute finding. Other: None.  CT CERVICAL SPINE FINDINGS Alignment: Normal. Skull base and vertebrae: No acute fracture. No primary bone lesion or focal pathologic process. Soft tissues and spinal canal: No prevertebral fluid or swelling. No visible canal hematoma. Disc levels: No significant degenerative disc disease or facet arthropathy. Upper chest: Negative. Other: None. IMPRESSION: 1. No evidence of significant acute traumatic injury to the skull or brain. 2. Mild chronic microvascular ischemic changes in the cerebral white matter. Electronically Signed   By: Vinnie Langton M.D.   On: 07/22/2020  14:31   CT Cervical Spine Wo Contrast  Result Date: 07/22/2020 CLINICAL DATA:  74 year old male with history of syncopal episode. Fall with injury to the head and neck. EXAM: CT HEAD WITHOUT CONTRAST CT CERVICAL SPINE WITHOUT CONTRAST TECHNIQUE: Multidetector CT imaging of the head and cervical spine was performed following the standard protocol without intravenous contrast. Multiplanar CT image reconstructions of the cervical spine were also generated. COMPARISON:  No priors. FINDINGS: CT HEAD FINDINGS Brain: Patchy and confluent areas of decreased attenuation are noted throughout the deep and periventricular white matter of the cerebral hemispheres bilaterally, compatible with chronic microvascular ischemic disease. No evidence of acute infarction, hemorrhage, hydrocephalus, extra-axial collection or mass lesion/mass effect. Vascular: No hyperdense vessel or unexpected calcification. Skull: Normal. Negative for fracture or focal lesion. Sinuses/Orbits: No acute finding. Other: None. CT CERVICAL SPINE FINDINGS Alignment: Normal. Skull base and vertebrae: No acute fracture. No primary bone lesion or focal pathologic process. Soft tissues and spinal canal: No prevertebral fluid or swelling. No visible canal hematoma. Disc levels: No significant degenerative disc disease or facet arthropathy. Upper chest: Negative. Other: None. IMPRESSION: 1. No evidence of significant acute traumatic injury to the skull or brain. 2. Mild chronic microvascular ischemic changes in the cerebral white matter. Electronically Signed   By: Vinnie Langton M.D.   On: 07/22/2020 14:31   MR ANGIO NECK WO CONTRAST  Result Date: 07/28/2020 CLINICAL DATA:  Recurrent syncope. Depression and anxiety. Bradycardia and orthostasis. EXAM: MRI HEAD WITHOUT CONTRAST MRA neck WITHOUT CONTRAST TECHNIQUE: Multiplanar, multiecho pulse sequences of the brain and surrounding structures were obtained without intravenous contrast. Angiographic images  of the neck were obtained using MRA technique without contrast. COMPARISON:  07/27/2020 FINDINGS: MRI HEAD FINDINGS Brain: Diffusion imaging does not show any acute or subacute infarction. Mild chronic small-vessel ischemic change affects the pons. No focal cerebellar insult. Cerebral hemispheres show moderate chronic small-vessel ischemic changes of the deep and subcortical white matter. No cortical or large vessel territory infarction. No mass, hemorrhage or hydrocephalus. Small subdural effusions, maximal thickness 4 mm on the right and 2.5 mm on the left. Vascular: Major vessels at the base of the brain show flow. Skull and upper cervical spine: Negative Sinuses/Orbits: Clear/normal.  Previous lens implants. Other: None MRA neck FINDINGS Both distal common carotid arteries widely patent. Both carotid bifurcations appear normal without stenosis or irregularity. Antegrade flow present in the midportion of both vertebral arteries. Vessel origins not evaluated using noncontrast technique. IMPRESSION: 1. No acute intracranial finding. Moderate chronic small-vessel ischemic changes of the cerebral hemispheric white matter and pons. 2. Small subdural effusions, maximal thickness 4 mm on the right and 2.5 mm on the left. No sign of acute or subacute hemorrhage. 3. No evidence of carotid bifurcation stenosis or irregularity. Antegrade flow present in the vertebral arteries. Electronically Signed   By: Nelson Chimes M.D.   On: 07/28/2020 08:49   MR BRAIN WO CONTRAST  Result Date: 07/28/2020 CLINICAL DATA:  Recurrent syncope.  Depression and anxiety. Bradycardia and orthostasis. EXAM: MRI HEAD WITHOUT CONTRAST MRA neck WITHOUT CONTRAST TECHNIQUE: Multiplanar, multiecho pulse sequences of the brain and surrounding structures were obtained without intravenous contrast. Angiographic images of the neck were obtained using MRA technique without contrast. COMPARISON:  07/27/2020 FINDINGS: MRI HEAD FINDINGS Brain: Diffusion  imaging does not show any acute or subacute infarction. Mild chronic small-vessel ischemic change affects the pons. No focal cerebellar insult. Cerebral hemispheres show moderate chronic small-vessel ischemic changes of the deep and subcortical white matter. No cortical or large vessel territory infarction. No mass, hemorrhage or hydrocephalus. Small subdural effusions, maximal thickness 4 mm on the right and 2.5 mm on the left. Vascular: Major vessels at the base of the brain show flow. Skull and upper cervical spine: Negative Sinuses/Orbits: Clear/normal.  Previous lens implants. Other: None MRA neck FINDINGS Both distal common carotid arteries widely patent. Both carotid bifurcations appear normal without stenosis or irregularity. Antegrade flow present in the midportion of both vertebral arteries. Vessel origins not evaluated using noncontrast technique. IMPRESSION: 1. No acute intracranial finding. Moderate chronic small-vessel ischemic changes of the cerebral hemispheric white matter and pons. 2. Small subdural effusions, maximal thickness 4 mm on the right and 2.5 mm on the left. No sign of acute or subacute hemorrhage. 3. No evidence of carotid bifurcation stenosis or irregularity. Antegrade flow present in the vertebral arteries. Electronically Signed   By: Nelson Chimes M.D.   On: 07/28/2020 08:49   US RENAL  Result Date: 07/24/2020 CLINICAL DATA:  Acute renal insufficiency EXAM: RENAL / URINARY TRACT ULTRASOUND COMPLETE COMPARISON:  CT scan 09/01/2019 FINDINGS: Right Kidney: Renal measurements: 11.3 x 5.5 x 5.1 cm = volume: 163 mL. Normal renal cortical thickness and echogenicity. Mild cortical scarring type changes are noted and appears stable since the prior CT scan. No worrisome renal lesions or hydronephrosis. Left Kidney: Renal measurements: 12.0 x 5.1 x 3.5 cm = volume: Inter 175 mL. Normal renal cortical thickness and echogenicity. Renal cortical scarring changes are noted. No worrisome renal  lesions or hydronephrosis. Bladder: Appears normal for degree of bladder distention. Bilateral ureteral jets are noted. Other: None. IMPRESSION: 1. Renal cortical scarring changes bilaterally but no worrisome renal lesions or hydronephrosis. 2. Normal bladder. Electronically Signed   By: Marijo Sanes M.D.   On: 07/24/2020 08:40   ECHOCARDIOGRAM COMPLETE  Result Date: 07/24/2020    ECHOCARDIOGRAM REPORT   Patient Name:   DONNELLE OLMEDA Date of Exam: 07/23/2020 Medical Rec #:  [id number]     Height:       72.0 in Accession #:    [id number]    Weight:       218.0 lb Date of Birth:  05-20-1946      BSA:          2.210 m Patient Age:    74 years      BP:           148/86 mmHg Patient Gender: M             HR:           64 bpm. Exam Location:  [facility] Procedure: 2D Echo Indications:     Syncope 780.2 / R55  History:         Patient has no prior history of Echocardiogram examinations.                  Risk Factors:Hypertension, Diabetes and Dyslipidemia.  Sonographer:     Avanell Shackleton  Referring Phys:  [id number] Anderson [place] CHOI Diagnosing Phys: Harrell Gave End MD IMPRESSIONS  1. Left ventricular ejection fraction, by estimation, is 55 to 60%. The left ventricle has normal function. Left ventricular endocardial border not optimally defined to evaluate regional wall motion. There is mild left ventricular hypertrophy. Left ventricular diastolic parameters are consistent with Grade I diastolic dysfunction (impaired relaxation).  2. Right ventricular systolic function is normal. The right ventricular size is mildly enlarged. Tricuspid regurgitation signal is inadequate for assessing PA pressure.  3. The mitral valve was not well visualized. Trivial mitral valve regurgitation.  4. The aortic valve was not well visualized. There is moderate calcification of the aortic valve. There is moderate thickening of the aortic valve. Aortic valve regurgitation is trivial. Mild to moderate aortic valve sclerosis/calcification is present, without  any evidence of aortic stenosis.  5. Aortic dilatation noted. There is mild dilatation of the aortic root, measuring 39 mm. FINDINGS  Left Ventricle: Left ventricular ejection fraction, by estimation, is 55 to 60%. The left ventricle has normal function. Left ventricular endocardial border not optimally defined to evaluate regional wall motion. The left ventricular internal cavity size was normal in size. There is mild left ventricular hypertrophy. Left ventricular diastolic parameters are consistent with Grade I diastolic dysfunction (impaired relaxation). Right Ventricle: The right ventricular size is mildly enlarged. Right vetricular wall thickness was not well visualized. Right ventricular systolic function is normal. Tricuspid regurgitation signal is inadequate for assessing PA pressure. Left Atrium: Left atrial size was normal in size. Right Atrium: Right atrial size was normal in size. Pericardium: There is no evidence of pericardial effusion. Mitral Valve: The mitral valve was not well visualized. Trivial mitral valve regurgitation. Tricuspid Valve: The tricuspid valve is not well visualized. Tricuspid valve regurgitation is trivial. Aortic Valve: The aortic valve was not well visualized. There is moderate calcification of the aortic valve. There is moderate thickening of the aortic valve. Aortic valve regurgitation is trivial. Mild to moderate aortic valve sclerosis/calcification is  present, without any evidence of aortic stenosis. Pulmonic Valve: The pulmonic valve was not well visualized. Pulmonic valve regurgitation is not visualized. No evidence of pulmonic stenosis. Aorta: Aortic dilatation noted. There is mild dilatation of the aortic root, measuring 39 mm. Pulmonary Artery: The pulmonary artery is of normal size. Venous: The inferior vena cava was not well visualized. IAS/Shunts: The interatrial septum was not well visualized.  LEFT VENTRICLE PLAX 2D LVIDd:         4.04 cm  Diastology LVIDs:          2.91 cm  LV e' medial:    5.66 cm/s LV PW:         1.04 cm  LV E/e' medial:  11.2 LV IVS:        1.18 cm  LV e' lateral:   7.94 cm/s LVOT diam:     2.10 cm  LV E/e' lateral: 8.0 LVOT Area:     3.46 cm  RIGHT VENTRICLE             IVC RV S prime:     18.40 cm/s  IVC diam: 2.18 cm TAPSE (M-mode): 2.7 cm LEFT ATRIUM             Index       RIGHT ATRIUM           Index LA diam:        4.60 cm 2.08 cm/m  RA Area:     12.60 cm LA Vol (  A2C):   40.6 ml 18.37 ml/m RA Volume:   26.50 ml  11.99 ml/m LA Vol (A4C):   48.0 ml 21.72 ml/m LA Biplane Vol: 44.8 ml 20.27 ml/m   AORTA Ao Root diam: 3.90 cm MITRAL VALVE MV Area (PHT): 2.23 cm    SHUNTS MV Decel Time: 340 msec    Systemic Diam: 2.10 cm MV E velocity: 63.40 cm/s MV A velocity: 84.40 cm/s MV E/A ratio:  0.75 Harrell Gave End MD Electronically signed by Nelva Bush MD Signature Date/Time: 07/24/2020/6:38:12 AM    Final     Microbiology: Recent Results (from the past 240 hour(s))  Respiratory Panel by RT PCR (Flu A&B, Covid) - Nasopharyngeal Swab     Status: None   Collection Time: 07/22/20  1:34 PM   Specimen: Nasopharyngeal Swab  Result Value Ref Range Status   SARS Coronavirus 2 by RT PCR NEGATIVE NEGATIVE Final    Comment: (NOTE) SARS-CoV-2 target nucleic acids are NOT DETECTED.  The SARS-CoV-2 RNA is generally detectable in upper respiratoy specimens during the acute phase of infection. The lowest concentration of SARS-CoV-2 viral copies this assay can detect is 131 copies/mL. A negative result does not preclude SARS-Cov-2 infection and should not be used as the sole basis for treatment or other patient management decisions. A negative result may occur with  improper specimen collection/handling, submission of specimen other than nasopharyngeal swab, presence of viral mutation(s) within the areas targeted by this assay, and inadequate number of viral copies (<131 copies/mL). A negative result must be combined with clinical observations,  patient history, and epidemiological information. The expected result is Negative.  Fact Sheet for Patients:  PinkCheek.be  Fact Sheet for Healthcare Providers:  GravelBags.it  This test is no t yet approved or cleared by the [place] FDA and  has been authorized for detection and/or diagnosis of SARS-CoV-2 by FDA under an Emergency Use Authorization (EUA). This EUA will remain  in effect (meaning this test can be used) for the duration of the COVID-19 declaration under Section 564(b)(1) of the Act, 21 U.S.C. section 360bbb-3(b)(1), unless the authorization is terminated or revoked sooner.     Influenza A by PCR NEGATIVE NEGATIVE Final   Influenza B by PCR NEGATIVE NEGATIVE Final    Comment: (NOTE) The Xpert Xpress SARS-CoV-2/FLU/RSV assay is intended as an aid in  the diagnosis of influenza from Nasopharyngeal swab specimens and  should not be used as a sole basis for treatment. Nasal washings and  aspirates are unacceptable for Xpert Xpress SARS-CoV-2/FLU/RSV  testing.  Fact Sheet for Patients: PinkCheek.be  Fact Sheet for Healthcare Providers: GravelBags.it  This test is not yet approved or cleared by the [place] FDA and  has been authorized for detection and/or diagnosis of SARS-CoV-2 by  FDA under an Emergency Use Authorization (EUA). This EUA will remain  in effect (meaning this test can be used) for the duration of the  Covid-19 declaration under Section 564(b)(1) of the Act, 21  U.S.C. section 360bbb-3(b)(1), unless the authorization is  terminated or revoked. Performed at [facility], [address] [place], [place] [address]      Labs: Basic Metabolic Panel: Recent Labs  Lab 07/23/20 0436 07/24/20 0441 07/25/20 0509 07/28/20 0444  NA 138 138 137 135  K 3.9 4.7 4.2 3.8  CL 99 102 103 101  CO2 _0 GLUCOSE  145* 254* 244* 173*  BUN 20 24* 18 12  CREATININE 1.39* 1.61* 1.18 1.07  CALCIUM  9.3 9.0 9.2 8.7*   Liver Function Tests: No results for input(s): AST, ALT, ALKPHOS, BILITOT, PROT, ALBUMIN in the last 168 hours. No results for input(s): LIPASE, AMYLASE in the last 168 hours. No results for input(s): AMMONIA in the last 168 hours. CBC: Recent Labs  Lab 07/23/20 0436 07/28/20 0444  WBC 7.6 6.1  NEUTROABS  --  3.8  HGB 13.5 13.2  HCT 40.0 38.3*  MCV 90.1 87.8  PLT 145* 150   Cardiac Enzymes: No results for input(s): CKTOTAL, CKMB, CKMBINDEX, TROPONINI in the last 168 hours. BNP: BNP (last 3 results) Recent Labs    07/22/20 1334  BNP 29.3    ProBNP (last 3 results) No results for input(s): PROBNP in the last 8760 hours.  CBG: Recent Labs  Lab 07/28/20 1648 07/28/20 2124 07/29/20 0833 07/29/20 1139 07/29/20 1545  GLUCAP 204* 223* 195* 192* 242*       Signed:  Alma Friendly, MD [facility] 07/29/2020, 5:04 PM

## 2020-07-29 NOTE — Progress Notes (Signed)
   07/29/20 0757  Vitals  Temp (!) 97.5 F (36.4 C)  Temp Source Oral  BP (!) 185/94  MAP (mmHg) 110  BP Location Right Arm  BP Method Automatic  Patient Position (if appropriate) Lying  Pulse Rate (!) 55  Pulse Rate Source [employer]  notified [place], [place] of BP

## 2020-07-29 NOTE — Progress Notes (Signed)
PROGRESS NOTE    KEYSHAUN Estrada  ZDG:[id number] DOB: [birth year] DOA: 07/22/2020 PCP: Jack Maple, MD     Brief Narrative:  Jack Estrada is a 74 y.o. 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  New events last 24 hours / Subjective: Today, met patient sitting up in bed having breakfast, still reports some slight dizziness but significantly better from yesterday, denies any worsening nausea, denies any vomiting, worsening headache, abdominal pain, fever/chills.   Assessment & Plan:   Principal Problem:   Syncope Active Problems:   Insulin dependent type 2 diabetes mellitus (HCC)   Hyperlipidemia associated with type 2 diabetes mellitus ([place])   Hypertension associated with type 2 diabetes mellitus (HCC)   BPH (benign prostatic hyperplasia)   Depression, recurrent (HCC)   Anxiety   Concussion   Syncope Orthostatic hypotension Sinus bradycardia Patient with sinus bradycardia on telemetry and EKG Troponin negative Echocardiogram unremarkable Repeat CT showed interval development of bilateral posttraumatic subdural hygroma MRA neck, MRI brain showed no acute intracranial finding, noted small subdural effusion, 4 mm on the right and 2.5 mm on the left, no signs of acute or subacute hemorrhage Continue to hold amlodipine Daily orthostatic vital signs Cardiology following, plan for 2-week Holter monitor placed prior to discharge Ordered compression stockings, abdominal binder Monitor on telemetry  BPPV Patient Dix-Hallpike test done by PT on 07/28/2020 positive for posterior canal BPPV, worse on the right Performed Epley's maneuver, with noted improvement Continue p.o. meclizine, antiemetics  Bilateral  subdural hygroma Repeat CT showed interval development of bilateral posttraumatic subdural hygroma, no significant mass-effect Neurosurgery consulted-spoke to Dr. Lacinda Axon on 07/27/2020, no intervention needed, as hygroma is less than 1 cm.  Plan for outpatient follow-up with repeat scan in 2 to 3 weeks post discharge  AKI on CKD stage IIIa Resolved Baseline creatinine 1.1, currently around baseline Renal ultrasound unremarkable Strict I's and O's, daily BMP  Diabetes mellitus type 2 with hyperglycemia Hemoglobin A1c 10.4 Continue Lantus, NovoLog, sliding scale  Hyperlipidemia Continue Lipitor  Depression/anxiety Continue home Ativan, Abilify, fluoxetine  Posterior scalp laceration Present on admission, supportive care  Likely concussion after syncopal episode Supportive care   DVT prophylaxis:  enoxaparin (LOVENOX) injection 40 mg Start: 07/22/20 2200 Place TED hose Start: 07/22/20 1728  Code Status: Full code Family Communication: Discussed with wife over the phone on 07/26/20 with patients permission Disposition Plan:  Status is: Inpatient  The patient will require care spanning > 2 midnights and should be moved to inpatient because: Hemodynamically unstable and Inpatient level of care appropriate due to severity of illness  Dispo: The patient is from: Home              Anticipated d/c is to: Home              Anticipated d/c date is: 1 day              Patient currently is not medically stable to d/c.     Consultants:   Cardiology  Neurosurgery  Procedures:   Epley's maneuver by PT on 07/28/2020  Antimicrobials:  Anti-infectives (From admission, onward)   None       Objective: Vitals:   07/29/20 0100 07/29/20 0500 07/29/20 0757 07/29/20 1136  BP: 121/68 (!) 146/76 (!) 185/94 140/84  Pulse: (!) 59 (!) 53 (!) 55 (!) 57  Resp: $Remo'20 18  20  'LBNOx$ Temp: 98.3 F (36.8 C) 97.6 F (36.4 C) (!) 97.5 F (36.4 C) 97.9 F (36.6 C)  TempSrc: Oral Oral Oral Oral    SpO2: 97% 96% 98% 98%  Weight:      Height:        Intake/Output Summary (Last 24 hours) at 07/29/2020 1508 Last data filed at 07/29/2020 1352 Gross per 24 hour  Intake 1499.02 ml  Output 1925 ml  Net -425.98 ml   Filed Weights   07/22/20 1331 07/24/20 0500 07/27/20 0536  Weight: 98.9 kg 98.8 kg 97.7 kg    Examination:  General:  NAD  Cardiovascular: S1, S2 present  Respiratory: CTAB  Abdomen: Soft, nontender, nondistended, bowel sounds present  Musculoskeletal: No bilateral pedal edema noted  Skin: Normal  Psychiatry: Normal mood  Neurology: No obvious focal neurologic deficit noted   Data Reviewed: I have personally reviewed following labs and imaging studies  CBC: Recent Labs  Lab 07/23/20 0436 07/28/20 0444  WBC 7.6 6.1  NEUTROABS  --  3.8  HGB 13.5 13.2  HCT 40.0 38.3*  MCV 90.1 87.8  PLT 145* 017   Basic Metabolic Panel: Recent Labs  Lab 07/23/20 0436 07/24/20 0441 07/25/20 0509 07/28/20 0444  NA 138 138 137 135  K 3.9 4.7 4.2 3.8  CL 99 102 103 101  CO2 $Re'29 30 27 28  'TBQ$ GLUCOSE 145* 254* 244* 173*  BUN 20 24* 18 12  CREATININE 1.39* 1.61* 1.18 1.07  CALCIUM 9.3 9.0 9.2 8.7*   GFR: Estimated Creatinine Clearance: 75.6 mL/min (by C-G formula based on SCr of 1.07 mg/dL). Liver Function Tests: No results for input(s): AST, ALT, ALKPHOS, BILITOT, PROT, ALBUMIN in the last 168 hours. No results for input(s): LIPASE, AMYLASE in the last 168 hours. No results for input(s): AMMONIA in the last 168 hours. Coagulation Profile: No results for input(s): INR, PROTIME in the last 168 hours. Cardiac Enzymes: No results for input(s): CKTOTAL, CKMB, CKMBINDEX, TROPONINI in the last 168 hours. BNP (last 3 results) No results for input(s): PROBNP in the last 8760 hours. HbA1C: No results for input(s): HGBA1C in the last 72 hours. CBG: Recent Labs  Lab 07/28/20 1237 07/28/20 1648 07/28/20 2124 07/29/20 0833 07/29/20 1139  GLUCAP 164* 204* 223*  195* 192*   Lipid Profile: No results for input(s): CHOL, HDL, LDLCALC, TRIG, CHOLHDL, LDLDIRECT in the last 72 hours. Thyroid Function Tests: No results for input(s): TSH, T4TOTAL, FREET4, T3FREE, THYROIDAB in the last 72 hours. Anemia Panel: No results for input(s): VITAMINB12, FOLATE, FERRITIN, TIBC, IRON, RETICCTPCT in the last 72 hours. Sepsis Labs: No results for input(s): PROCALCITON, LATICACIDVEN in the last 168 hours.  Recent Results (from the past 240 hour(s))  Respiratory Panel by RT PCR (Flu A&B, Covid) - Nasopharyngeal Swab     Status: None   Collection Time: 07/22/20  1:34 PM   Specimen: Nasopharyngeal Swab  Result Value Ref Range Status   SARS Coronavirus 2 by RT PCR NEGATIVE NEGATIVE Final    Comment: (NOTE) SARS-CoV-2 target nucleic acids are NOT DETECTED.  The SARS-CoV-2 RNA is generally detectable in upper respiratoy specimens during the acute phase of infection. The lowest concentration of SARS-CoV-2 viral copies this assay can detect is 131 copies/mL. A negative result does not preclude SARS-Cov-2 infection and should not be used as the sole basis for treatment or other patient management decisions. A negative result may occur with  improper specimen collection/handling, submission of specimen other than nasopharyngeal swab, presence of viral mutation(s) within the areas targeted by this assay, and inadequate number of viral copies (<131 copies/mL). A negative result must be combined with clinical observations, patient history, and epidemiological information. The expected result is Negative.  Fact Sheet for Patients:  PinkCheek.be  Fact Sheet for Healthcare Providers:  GravelBags.it  This test is no t yet approved or cleared by the [place] FDA and  has been authorized for detection and/or diagnosis of SARS-CoV-2 by FDA under an Emergency Use Authorization (EUA). This EUA will remain  in  effect (meaning this test can be used) for the duration of the COVID-19 declaration under Section 564(b)(1) of the Act, 21 U.S.C. section 360bbb-3(b)(1), unless the authorization is terminated or revoked sooner.     Influenza A by PCR NEGATIVE NEGATIVE Final   Influenza B by PCR NEGATIVE NEGATIVE Final    Comment: (NOTE) The Xpert Xpress SARS-CoV-2/FLU/RSV assay is intended as an aid in  the diagnosis of influenza from Nasopharyngeal swab specimens and  should not be used as a sole basis for treatment. Nasal washings and  aspirates are unacceptable for Xpert Xpress SARS-CoV-2/FLU/RSV  testing.  Fact Sheet for Patients: PinkCheek.be  Fact Sheet for Healthcare Providers: GravelBags.it  This test is not yet approved or cleared by the [place] FDA and  has been authorized for detection and/or diagnosis of SARS-CoV-2 by  FDA under an Emergency Use Authorization (EUA). This EUA will remain  in effect (meaning this test can be used) for the duration of the  Covid-19 declaration under Section 564(b)(1) of the Act, 21  U.S.C. section 360bbb-3(b)(1), unless the authorization is  terminated or revoked. Performed at [facility], [address] [place], [place] [address]       Radiology Studies: MR ANGIO NECK WO CONTRAST  Result Date: 07/28/2020 CLINICAL DATA:  Recurrent syncope. Depression and anxiety. Bradycardia and orthostasis. EXAM: MRI HEAD WITHOUT CONTRAST MRA neck WITHOUT CONTRAST TECHNIQUE: Multiplanar, multiecho pulse sequences of the brain and surrounding structures were obtained without intravenous contrast. Angiographic images of the neck were obtained using MRA technique without contrast. COMPARISON:  07/27/2020 FINDINGS: MRI HEAD FINDINGS Brain: Diffusion imaging does not show any acute or subacute infarction. Mild chronic small-vessel ischemic change affects the pons. No focal cerebellar insult.  Cerebral hemispheres show moderate chronic small-vessel ischemic changes of the deep and subcortical white matter. No cortical or large vessel territory infarction. No mass, hemorrhage or hydrocephalus. Small subdural effusions, maximal thickness 4 mm on the right and 2.5 mm on the left. Vascular: Major vessels at the base of the brain show flow. Skull and upper cervical spine: Negative Sinuses/Orbits: Clear/normal.  Previous lens implants. Other: None MRA neck FINDINGS Both distal common carotid arteries widely patent. Both carotid bifurcations appear normal without stenosis or irregularity. Antegrade flow present in the midportion of both vertebral arteries. Vessel origins not evaluated using noncontrast technique. IMPRESSION: 1. No acute intracranial finding. Moderate chronic small-vessel ischemic changes of the cerebral hemispheric white matter and pons. 2. Small subdural effusions, maximal thickness 4 mm on the right and 2.5 mm on the left. No sign of acute or subacute hemorrhage. 3. No evidence of carotid bifurcation stenosis or irregularity. Antegrade flow present in the vertebral arteries. Electronically Signed   By: Nelson Chimes M.D.   On: 07/28/2020 08:49   MR BRAIN WO CONTRAST  Result Date: 07/28/2020 CLINICAL DATA:  Recurrent syncope. Depression and anxiety. Bradycardia and orthostasis. EXAM:  MRI HEAD WITHOUT CONTRAST MRA neck WITHOUT CONTRAST TECHNIQUE: Multiplanar, multiecho pulse sequences of the brain and surrounding structures were obtained without intravenous contrast. Angiographic images of the neck were obtained using MRA technique without contrast. COMPARISON:  07/27/2020 FINDINGS: MRI HEAD FINDINGS Brain: Diffusion imaging does not show any acute or subacute infarction. Mild chronic small-vessel ischemic change affects the pons. No focal cerebellar insult. Cerebral hemispheres show moderate chronic small-vessel ischemic changes of the deep and subcortical white matter. No cortical or large  vessel territory infarction. No mass, hemorrhage or hydrocephalus. Small subdural effusions, maximal thickness 4 mm on the right and 2.5 mm on the left. Vascular: Major vessels at the base of the brain show flow. Skull and upper cervical spine: Negative Sinuses/Orbits: Clear/normal.  Previous lens implants. Other: None MRA neck FINDINGS Both distal common carotid arteries widely patent. Both carotid bifurcations appear normal without stenosis or irregularity. Antegrade flow present in the midportion of both vertebral arteries. Vessel origins not evaluated using noncontrast technique. IMPRESSION: 1. No acute intracranial finding. Moderate chronic small-vessel ischemic changes of the cerebral hemispheric white matter and pons. 2. Small subdural effusions, maximal thickness 4 mm on the right and 2.5 mm on the left. No sign of acute or subacute hemorrhage. 3. No evidence of carotid bifurcation stenosis or irregularity. Antegrade flow present in the vertebral arteries. Electronically Signed   By: Nelson Chimes M.D.   On: 07/28/2020 08:49      Scheduled Meds: . ARIPiprazole  5 mg Oral Daily  . atorvastatin  40 mg Oral Daily  . enoxaparin (LOVENOX) injection  40 mg Subcutaneous Q24H  . famotidine  20 mg Oral BID  . FLUoxetine  40 mg Oral Daily  . influenza vaccine adjuvanted  0.5 mL Intramuscular Tomorrow-1000  . insulin aspart  0-15 Units Subcutaneous TID WC  . insulin aspart  0-5 Units Subcutaneous QHS  . insulin aspart  7 Units Subcutaneous TID WC  . insulin glargine  30 Units Subcutaneous Daily  . meclizine  12.5 mg Oral TID  . polyethylene glycol  17 g Oral Daily  . senna-docusate  1 tablet Oral QHS  . sodium chloride flush  3 mL Intravenous Q12H   Continuous Infusions:    LOS: 5 days     Alma Friendly, MD [facility] 07/29/2020, 3:08 PM

## 2020-08-01 ENCOUNTER — Other Ambulatory Visit (HOSPITAL_COMMUNITY): Payer: Self-pay | Admitting: Neurosurgery

## 2020-08-01 ENCOUNTER — Other Ambulatory Visit: Payer: Self-pay | Admitting: Neurosurgery

## 2020-08-01 DIAGNOSIS — G9608 Other cranial cerebrospinal fluid leak: Secondary | ICD-10-CM

## 2020-08-03 DIAGNOSIS — E113293 Type 2 diabetes mellitus with mild nonproliferative diabetic retinopathy without macular edema, bilateral: Secondary | ICD-10-CM | POA: Diagnosis not present

## 2020-08-03 LAB — HM DIABETES EYE EXAM

## 2020-08-17 ENCOUNTER — Ambulatory Visit
Admission: RE | Admit: 2020-08-17 | Discharge: 2020-08-17 | Disposition: A | Discharge status: Home / Self Care | Payer: Medicare Other | Source: Ambulatory Visit | Attending: Neurosurgery | Admitting: Neurosurgery

## 2020-08-17 ENCOUNTER — Other Ambulatory Visit: Payer: Self-pay

## 2020-08-17 DIAGNOSIS — R519 Headache, unspecified: Secondary | ICD-10-CM | POA: Diagnosis not present

## 2020-08-17 DIAGNOSIS — G9608 Other cranial cerebrospinal fluid leak: Secondary | ICD-10-CM | POA: Diagnosis not present

## 2020-08-21 DIAGNOSIS — R930 Abnormal findings on diagnostic imaging of skull and head, not elsewhere classified: Secondary | ICD-10-CM | POA: Diagnosis not present

## 2020-09-03 ENCOUNTER — Ambulatory Visit: Payer: Self-pay

## 2020-09-03 NOTE — Telephone Encounter (Signed)
Pt. States he fell "about 1 month ago and I was in the hospital." States he has had dizziness since then and has seen neurology "and they don't see anything wrong." Has vertigo when changing positions. Has fullness in his ears as well. Warm transfer to [facility] in the practice for an appointment.  Answer Assessment - Initial Assessment Questions 1. DESCRIPTION: "Describe your dizziness."     Dizzy 2. VERTIGO: "Do you feel like either you or the room is spinning or tilting?"      Yes 3. LIGHTHEADED: "Do you feel lightheaded?" (e.g., somewhat faint, woozy, weak upon standing)     Yes 4. SEVERITY: "How bad is it?"  "Can you walk?"   - MILD: Feels unsteady but walking normally.   - MODERATE: Feels very unsteady when walking, but not falling; interferes with normal activities (e.g., school, work) .   - SEVERE: Unable to walk without falling, or requires assistance to walk without falling.     Moderate 5. ONSET:  "When did the dizziness begin?"     3 weeks ago 6. AGGRAVATING FACTORS: "Does anything make it worse?" (e.g., standing, change in head position)     Movement 7. CAUSE: "What do you think is causing the dizziness?"     Unsure 8. RECURRENT SYMPTOM: "Have you had dizziness before?" If Yes, ask: "When was the last time?" "What happened that time?"     Yes 9. OTHER SYMPTOMS: "Do you have any other symptoms?" (e.g., headache, weakness, numbness, vomiting, earache)     Ears feel full 10. PREGNANCY: "Is there any chance you are pregnant?" "When was your last menstrual period?"       N/A  Protocols used: DIZZINESS - VERTIGO-A-AH

## 2020-09-04 ENCOUNTER — Other Ambulatory Visit: Payer: Self-pay

## 2020-09-04 ENCOUNTER — Ambulatory Visit: Payer: Medicare Other | Admitting: Family Medicine

## 2020-09-04 ENCOUNTER — Encounter: Payer: Self-pay | Admitting: Family Medicine

## 2020-09-04 ENCOUNTER — Ambulatory Visit (INDEPENDENT_AMBULATORY_CARE_PROVIDER_SITE_OTHER): Payer: Medicare Other | Admitting: Family Medicine

## 2020-09-04 ENCOUNTER — Telehealth: Payer: Self-pay

## 2020-09-04 VITALS — BP 104/69 | HR 89 | Temp 97.6°F | Wt 199.0 lb

## 2020-09-04 DIAGNOSIS — R350 Frequency of micturition: Secondary | ICD-10-CM

## 2020-09-04 DIAGNOSIS — E1159 Type 2 diabetes mellitus with other circulatory complications: Secondary | ICD-10-CM | POA: Diagnosis not present

## 2020-09-04 DIAGNOSIS — Z794 Long term (current) use of insulin: Secondary | ICD-10-CM

## 2020-09-04 DIAGNOSIS — I152 Hypertension secondary to endocrine disorders: Secondary | ICD-10-CM

## 2020-09-04 DIAGNOSIS — R42 Dizziness and giddiness: Secondary | ICD-10-CM | POA: Diagnosis not present

## 2020-09-04 DIAGNOSIS — R55 Syncope and collapse: Secondary | ICD-10-CM | POA: Diagnosis not present

## 2020-09-04 DIAGNOSIS — E119 Type 2 diabetes mellitus without complications: Secondary | ICD-10-CM

## 2020-09-04 LAB — URINALYSIS, ROUTINE W REFLEX MICROSCOPIC
Bilirubin, UA: NEGATIVE
Leukocytes,UA: NEGATIVE
Nitrite, UA: NEGATIVE
Protein,UA: NEGATIVE
RBC, UA: NEGATIVE
Specific Gravity, UA: <1.005 — ABNORMAL LOW (ref 1.005–1.030)
Urobilinogen, Ur: 0.2 mg/dL (ref 0.2–1.0)
pH, UA: 5 (ref 5.0–7.5)

## 2020-09-04 MED ORDER — METFORMIN HCL ER 750 MG PO TB24: 750.0000 mg | ORAL_TABLET | Freq: Every day | ORAL | 0 refills | Status: DC

## 2020-09-04 MED ORDER — LANTUS SOLOSTAR 100 UNIT/ML ~~LOC~~ SOPN
20.0000 [IU] | PEN_INJECTOR | Freq: Every day | SUBCUTANEOUS | 3 refills | Status: DC
Start: 2020-09-04 — End: 2020-12-10

## 2020-09-04 MED ORDER — BLOOD GLUCOSE MONITOR KIT: PACK | 0 refills | Status: AC

## 2020-09-04 MED ORDER — INSULIN LISPRO (1 UNIT DIAL) 100 UNIT/ML (KWIKPEN): 10.0000 [IU] | PEN_INJECTOR | Freq: Three times a day (TID) | SUBCUTANEOUS | 2 refills | Status: DC

## 2020-09-04 NOTE — Patient Instructions (Signed)
It was great to see you!  Our plans for today:  - We are referring you to Cardiology and Physical Therapy, someone will call you with these appointments.  - Take the metformin every day. Let us know if you don't tolerate this.  - I sent refills of your insulin to the pharmacy. - Take the prescription for the glucometer to the pharmacy. - come back in 4 weeks for follow up.   We are checking some labs today, we will release these results to your MyChart.  Take care and seek immediate care sooner if you develop any concerns.   Dr. Ky Barban

## 2020-09-04 NOTE — Progress Notes (Signed)
SUBJECTIVE:   CHIEF COMPLAINT / HPI:   Patient Active Problem List   Diagnosis Date Noted  . Dizziness 09/04/2020  . Concussion 07/23/2020  . Syncope 07/22/2020  . Left hip pain 05/28/2020  . Penile cancer ([place]) 09/19/2019  . Anxiety 05/09/2019  . Depression, recurrent ([place]) 01/31/2019  . Osteoarthritis of carpometacarpal (CMC) joint of thumb 02/22/2018  . Hypertension associated with type 2 diabetes mellitus ([place]) 07/09/2015  . BPH (benign prostatic hyperplasia) 07/09/2015  . Insulin dependent type 2 diabetes mellitus ([place])   . Hyperlipidemia associated with type 2 diabetes mellitus ([place])    ER FOLLOW UP - admitted 11/7-11/14. Time since discharge: 1 month Hospital/facility: [facility] Diagnosis: syncope Procedures/tests:  - ECHO unremarkable - CT head b/l posttraumatic subdural hygroma - MRI/MRA with small subdural effusion, no hemorrhage - +Dix Hallpike, improved with Epley Consultants: cardiology, neurosurgery, PT New medications:  - held amlodipine - meclizine, antiemetics Discharge instructions:   - PCP f/u - f/u w/ Cards for Holter - f/u with Neurosurgery - 12/7 OV, f/u scan with effusions resolved. No further f/u needed. Status: worse  Meclizine helped.  DIZZINESS Duration: ~2 months Description of symptoms: lightheaded, room spinning Duration of episode: seconds Dizziness frequency: recurrent Provoking factors: turning head while in bed Aggravating factors:  Position changes Triggered by rolling over in bed: yes Triggered by bending over: yes Aggravated by head movement: yes to R Aggravated by exertion, coughing, loud noises: no Recent head injury: yes with fall prompting initial admission. Recent or current viral symptoms: no History of vasovagal episodes: yes Nausea: yes, during episode Vomiting: yes, during episode Tinnitus: a little in R ear Hearing loss: no Aural fullness: yes Headache: no Photophobia/phonophobia: no Unsteady gait: shuffling  more since discharge Diplopia, dysarthria, dysphagia or weakness: some slurred speech sometimes when gets tired Related to exertion: no Pallor: no Diaphoresis: no Dyspnea: no Chest pain: no   Diabetes, Type 2 - Last A1c 10.4 07/23/20 - Medications: lantus 30u daily, humalog 10u TID with meals - Compliance: ran out of insulin yesterday. - Checking BG at home: yes, reports 140s, highest 160s. - Eye exam: UTD with retinopathy - Microalbumin: due - Statin: yes - Endorses polyuria, polydipsia.   Hypertension: - Medications: none (amlodipine recently d/ced) - Compliance: n/a - Checking BP at home: yes, 120s - with occasional orthostatic sx   OBJECTIVE:   BP 104/69   Pulse 89   Temp 97.6 F (36.4 C)   Wt 199 lb (90.3 kg)   SpO2 98%   BMI 26.99 kg/m   Gen: well appearing, in NAD Cardiac: RRR, no murmur Lungs: CTAB MSK: Full ROM, strength 5/5 to U/LE bilaterally, deep reflexes intact, normal gait.  No edema.  Neuro: Alert and oriented, speech normal.  Romberg negative.  Optic field normal. PERRL, Extraocular movements intact.  Intact symmetric sensation to light touch of face and extremities bilaterally.  Hearing grossly intact bilaterally.  Tongue protrudes normally with no deviation.  Shoulder shrug, smile symmetric. +Dix Hallpike.  ASSESSMENT/PLAN:   Hypertension associated with type 2 diabetes mellitus (HCC) Low normal today with +orthostatics, likely 2/2 dehydration from poorly controlled diabetes, see separate plan. Recommend staying hydrated. F/u in 4 weeks.  Insulin dependent type 2 diabetes mellitus (HCC) Uncontrolled, last A1c 10.4, likely contributing to dizziness. UA with 3+ glucose and ketones. Reports controlled CBGs at home though in hospital had readings in [place] and has been out of insulin and reports he needs new glucometer, Rx sent. Will check glucose with BMP  today. Refills for insulin sent, will also start metformin. F/u in 4 weeks.  Dizziness ~2 month  history with recent hospital admission for syncope. Likely multifactorial 2/2 dehydration from poorly controlled diabetes, +orthostatics today, as well as vertigo evidenced by +Dix Hallpike. For diabetes plan, see separate A/P. Will refer to vestibular rehab for Epley manipulation. Will refer to Cardiology as he never received Holter monitor after admission. Will also check labs to assess for other contributions. No findings on exam or recent imaging to suggest acute stroke. F/u in 4 weeks.     Myles Gip, DO

## 2020-09-04 NOTE — Assessment & Plan Note (Addendum)
Uncontrolled, last A1c 10.4, likely contributing to dizziness. UA with 3+ glucose and ketones. Reports controlled CBGs at home though in hospital had readings in [place] and has been out of insulin and reports he needs new glucometer, Rx sent. Will check glucose with BMP today. Refills for insulin sent, will also start metformin. F/u in 4 weeks.

## 2020-09-04 NOTE — Telephone Encounter (Signed)
Pt called in stating that he was having car trouble. Pt was scheduled for 2:00 today. Asked pt if he had video capabilities to do mychart visit if provider says it is ok pt states that he had a smart phone attempted to give instructions on how to do video visit with link sent to him if he didn't have mychart on his phone. Pt states that he will schedule for tomorrow and if he feels worse he will go to er pt states he can barely walk. Sending to provider as fyi since he is scheduled tomorrow

## 2020-09-04 NOTE — Progress Notes (Deleted)
   SUBJECTIVE:   CHIEF COMPLAINT / HPI:   ER FOLLOW UP Time since discharge: 1 month Hospital/facility:  Diagnosis: syncope Procedures/tests:  - CT Head with b/l posttraumatic subdural hygroma.  - MRI/MRA NAICA, small subdural effusion, no findings of hemorrhage. - ECHO mild LVH, no WMA. Trivial MR, AR. Normal wall function. - Positive Dix/Hallpike on 11/13 with subsequent Epley's Consultants: Neurosurgery, Cardiology (outpt), PT New medications: meclizine, antiemetics. Held amlodipine. Discharge instructions:   - F/u with PCP - F/u with Cards for Holter. - F/u with Neurosurgery 2-3 post discharge for repeat scan - 12/7 OV, resolution on repeat scan 12/3. No further f/u needed. Status: {Blank multiple:19196::"better","worse","stable","fluctuating"}    DIZZINESS - h/o diabetes, last A1c 10.4 - recent admission 11/7-11/14 for syncope.  Duration: {Blank single:19197::"days","weeks","months"} Description of symptoms: {Blank single:19197::"lightheaded","off kilter","ill-defined","room spinning","not room spinning"} Duration of episode: {Blank single:19197::"seconds","minutes","hours"} Dizziness frequency: {Blank single:19197::"no history of the same","recurrent"} Provoking factors: {Blank single:19197::"none"} Aggravating factors:  {Blank single:19197::"none"} Triggered by rolling over in bed: {Blank single:19197::"yes","no"} Triggered by bending over: {Blank single:19197::"yes","no"} Aggravated by head movement: {Blank single:19197::"yes","no"} Aggravated by exertion, coughing, loud noises: {Blank single:19197::"yes","no"} Recent head injury: {Blank single:19197::"yes","no"} Recent or current viral symptoms: {Blank single:19197::"yes","no"} History of vasovagal episodes: {Blank single:19197::"yes","no"} Nausea: {Blank single:19197::"yes","no"} Vomiting: {Blank single:19197::"yes","no"} Tinnitus: {Blank single:19197::"yes","no"} Hearing loss: {Blank  single:19197::"yes","no"} Aural fullness: {Blank single:19197::"yes","no"} Headache: {Blank single:19197::"yes","no"} Photophobia/phonophobia: {Blank single:19197::"yes","no"} Unsteady gait: {Blank single:19197::"yes","no"} Postural instability: {Blank single:19197::"yes","no"} Diplopia, dysarthria, dysphagia or weakness: {Blank single:19197::"yes","no"} Related to exertion: {Blank single:19197::"yes","no"} Pallor: {Blank single:19197::"yes","no"} Diaphoresis: {Blank single:19197::"yes","no"} Dyspnea: {Blank single:19197::"yes","no"} Chest pain: {Blank single:19197::"yes","no"}   PERTINENT  PMH / PSH:  Patient Active Problem List   Diagnosis Date Noted  . Concussion 07/23/2020  . Syncope 07/22/2020  . Left hip pain 05/28/2020  . Penile cancer ([place]) 09/19/2019  . Anxiety 05/09/2019  . Depression, recurrent ([place]) 01/31/2019  . Osteoarthritis of carpometacarpal (CMC) joint of thumb 02/22/2018  . Hypertension associated with type 2 diabetes mellitus ([place]) 07/09/2015  . BPH (benign prostatic hyperplasia) 07/09/2015  . Insulin dependent type 2 diabetes mellitus ([place])   . Hyperlipidemia associated with type 2 diabetes mellitus ([place])      OBJECTIVE:   There were no vitals taken for this visit.  ***  ASSESSMENT/PLAN:   No problem-specific Assessment & Plan notes found for this encounter.     Myles Gip, DO [place]

## 2020-09-04 NOTE — Assessment & Plan Note (Signed)
~  2 month history with recent hospital admission for syncope. Likely multifactorial 2/2 dehydration from poorly controlled diabetes, +orthostatics today, as well as vertigo evidenced by +Dix Hallpike. For diabetes plan, see separate A/P. Will refer to vestibular rehab for Epley manipulation. Will refer to Cardiology as he never received Holter monitor after admission. Will also check labs to assess for other contributions. No findings on exam or recent imaging to suggest acute stroke. F/u in 4 weeks.

## 2020-09-04 NOTE — Assessment & Plan Note (Addendum)
Low normal today with +orthostatics, likely 2/2 dehydration from poorly controlled diabetes, see separate plan. Recommend staying hydrated. F/u in 4 weeks.

## 2020-09-05 ENCOUNTER — Telehealth: Payer: Self-pay | Admitting: Family Medicine

## 2020-09-05 ENCOUNTER — Telehealth: Payer: Self-pay | Admitting: Nurse Practitioner

## 2020-09-05 ENCOUNTER — Ambulatory Visit: Payer: Medicare Other | Admitting: Family Medicine

## 2020-09-05 LAB — BASIC METABOLIC PANEL
BUN/Creatinine Ratio: 19 (ref 10–24)
BUN: 37 mg/dL — ABNORMAL HIGH (ref 8–27)
CO2: 20 mmol/L (ref 20–29)
Calcium: 9.9 mg/dL (ref 8.6–10.2)
Chloride: 86 mmol/L — ABNORMAL LOW (ref 96–106)
Creatinine, Ser: 1.91 mg/dL — ABNORMAL HIGH (ref 0.76–1.27)
GFR calc Af Amer: 40 mL/min/{1.73_m2} — ABNORMAL LOW (ref >59)
GFR calc non Af Amer: 34 mL/min/{1.73_m2} — ABNORMAL LOW (ref >59)
Glucose: 734 mg/dL (ref 65–99)
Potassium: 5.3 mmol/L — ABNORMAL HIGH (ref 3.5–5.2)
Sodium: 125 mmol/L — ABNORMAL LOW (ref 134–144)

## 2020-09-05 LAB — CBC
Hematocrit: 49.2 % (ref 37.5–51.0)
Hemoglobin: 15.6 g/dL (ref 13.0–17.7)
MCH: 30.1 pg (ref 26.6–33.0)
MCHC: 31.7 g/dL (ref 31.5–35.7)
MCV: 95 fL (ref 79–97)
Platelets: 164 10*3/uL (ref 150–450)
RBC: 5.19 x10E6/uL (ref 4.14–5.80)
RDW: 11.9 % (ref 11.6–15.4)
WBC: 7.6 10*3/uL (ref 3.4–10.8)

## 2020-09-05 LAB — TSH: TSH: 3.22 u[IU]/mL (ref 0.450–4.500)

## 2020-09-05 NOTE — Telephone Encounter (Signed)
Follow up call made to patient after speaking with him this morning regarding critical labs, see separate result note.  He was able to pick up insulin and glucose meter this morning. So far has taken 30u lantus and 20u of humalog total. Last CBG read "high." About to recheck blood sugar and will administer 10u plus 5u if >180. Instructed to continue checking CBGs every 2 hours and administer 5u for every CBG >180. Should present to hospital if develops confusion, vomiting. Maintain oral hydration.   Will see patient for follow up next week.

## 2020-09-05 NOTE — Telephone Encounter (Signed)
Pt made appt yesterday

## 2020-09-05 NOTE — Telephone Encounter (Signed)
Call intake at 0200 from nursing call team that critical lab value present -- glucose 734.  On review patient had been out of insulin for 2 days and had visit yesterday with an office provider.  On review Dr. Ky Barban spoke to patient this morning and reviewed his critical labs.

## 2020-09-05 NOTE — Telephone Encounter (Signed)
Called pt scheduled for 12/28

## 2020-09-09 ENCOUNTER — Other Ambulatory Visit: Payer: Self-pay | Admitting: Family Medicine

## 2020-09-10 ENCOUNTER — Ambulatory Visit: Payer: Self-pay

## 2020-09-10 NOTE — Telephone Encounter (Signed)
Pt stated he would not go to urgent care and refused to go to urgent care made aware of message and pt stated he would be at his Apt due to not wanting to go to ER or Urgent care

## 2020-09-10 NOTE — Telephone Encounter (Signed)
I do recommend he head to urgent care today due to fall with pain -- would like to see him have imaging and face to face visit there to further assess.  Thank you.

## 2020-09-10 NOTE — Telephone Encounter (Signed)
Noted, thank you for reaching out to him.  Will alert Dr. Linwood Dibbles here of his refusal to go to UC for further assessment today and need for assessment tomorrow post fall -- I know his sugars had been elevated recently with changes made to regimen due to this.

## 2020-09-10 NOTE — Telephone Encounter (Signed)
Pt fell on 09/07/20. Pt stated he tripped over his recliner and fell on the the floor. He denies hitting his head.  Pt c/o left lateral to anterior left sided chest wall pain. Pt stated pain comes with sudden movements and rates the pain moderate. Pt stated no bruising, cuts or scratches. Pt having no breathing difficulty.  Pt also c/o right side of jaw "popping". Pt stated no pain to jaw until it pops and stated that it hurts when the popping sound happens.   Pt taking 400 mg Ibuprofen twice a day. Pt wanted an xray to see if ribs were cracked.  Care advice given to pt and pt verbalized understanding.  Pt requesting xray. Pt has OV tomorrow that was previously scheduled.  Routing back to office to review. Reason for Disposition . [1] High-risk adult (e.g., age > 60 years, osteoporosis, chronic steroid use) AND [2] still hurts  Answer Assessment - Initial Assessment Questions 1. MECHANISM: "How did the fall happen?"     Tripped over recliner was "clumsy" 2. DOMESTIC VIOLENCE AND ELDER ABUSE SCREENING: "Did you fall because someone pushed you or tried to hurt you?" If Yes, ask: "Are you safe now?"    no 3. ONSET: "When did the fall happen?" (e.g., minutes, hours, or days ago)     09/07/20 4. LOCATION: "What part of the body hit the ground?" (e.g., back, buttocks, head, hips, knees, hands, head, stomach)     Left side of chest, right side of jaw is "poppiing" and is painful 5. INJURY: "Did you hurt (injure) yourself when you fell?" If Yes, ask: "What did you injure? Tell me more about this?" (e.g., body area; type of injury; pain severity)"     Yes-left side of chest 6. PAIN: "Is there any pain?" If Yes, ask: "How bad is the pain?" (e.g., Scale 1-10; or mild,  moderate, severe)   - NONE (0): no pain   - MILD (1-3): doesn't interfere with normal activities    - MODERATE (4-7): interferes with normal activities or awakens from sleep    - SEVERE (8-10): excruciating pain, unable to do any normal  activities      Hurts to breathe in certain position and sudden movements-moderate 7. SIZE: For cuts, bruises, or swelling, ask: "How large is it?" (e.g., inches or centimeters)      Mild swelling to lateral to anterior chest to left side 8. PREGNANCY: "Is there any chance you are pregnant?" "When was your last menstrual period?"     n/a 9. OTHER SYMPTOMS: "Do you have any other symptoms?" (e.g., dizziness, fever, weakness; new onset or worsening).      Jaw pain, dizziness (previous dizziness) 10. CAUSE: "What do you think caused the fall (or falling)?" (e.g., tripped, dizzy spell)       Tripped  Answer Assessment - Initial Assessment Questions 1. MECHANISM: "How did the injury happen?"     Fell on 09/07/20 2. ONSET: "When did the injury happen?" (Minutes or hours ago)     Friday 3. LOCATION: "Where on the chest is the injury located?"     Left side of chest 4. APPEARANCE: "What does the injury look like?"     Mild swelling noted 5. BLEEDING: "Is there any bleeding now? If Yes, ask: How long has it been bleeding?"     no 6. SEVERITY: "Any difficulty with breathing?"     no 7. SIZE: For cuts, bruises, or swelling, ask: "How large is it?" (e.g., inches or centimeters)  Goes lateral to anterior chest on left side 8. PAIN: "Is there pain?" If Yes, ask: "How bad is the pain?"   (e.g., Scale 1-10; or mild, moderate, severe)     moderate 9. TETANUS: For any breaks in the skin, ask: "When was the last tetanus booster?"     n/a  Protocols used: CHEST INJURY-A-AH, FALLS AND FALLING-A-AH

## 2020-09-11 ENCOUNTER — Encounter: Payer: Self-pay | Admitting: Family Medicine

## 2020-09-11 ENCOUNTER — Ambulatory Visit (INDEPENDENT_AMBULATORY_CARE_PROVIDER_SITE_OTHER): Payer: Medicare Other | Admitting: Family Medicine

## 2020-09-11 ENCOUNTER — Ambulatory Visit
Admission: RE | Admit: 2020-09-11 | Discharge: 2020-09-11 | Disposition: A | Discharge status: Home / Self Care | Payer: Medicare Other | Source: Ambulatory Visit | Attending: Family Medicine | Admitting: Family Medicine

## 2020-09-11 ENCOUNTER — Ambulatory Visit
Admission: RE | Admit: 2020-09-11 | Discharge: 2020-09-11 | Disposition: A | Discharge status: Home / Self Care | Payer: Medicare Other | Attending: Family Medicine | Admitting: Family Medicine

## 2020-09-11 ENCOUNTER — Other Ambulatory Visit: Payer: Self-pay

## 2020-09-11 ENCOUNTER — Other Ambulatory Visit: Payer: Self-pay | Admitting: Family Medicine

## 2020-09-11 VITALS — BP 121/81 | HR 60 | Temp 97.5°F

## 2020-09-11 DIAGNOSIS — I10 Essential (primary) hypertension: Secondary | ICD-10-CM | POA: Diagnosis not present

## 2020-09-11 DIAGNOSIS — R42 Dizziness and giddiness: Secondary | ICD-10-CM | POA: Diagnosis not present

## 2020-09-11 DIAGNOSIS — R0781 Pleurodynia: Secondary | ICD-10-CM | POA: Diagnosis not present

## 2020-09-11 DIAGNOSIS — R55 Syncope and collapse: Secondary | ICD-10-CM | POA: Diagnosis not present

## 2020-09-11 DIAGNOSIS — Z794 Long term (current) use of insulin: Secondary | ICD-10-CM

## 2020-09-11 DIAGNOSIS — E119 Type 2 diabetes mellitus without complications: Secondary | ICD-10-CM

## 2020-09-11 DIAGNOSIS — S2242XA Multiple fractures of ribs, left side, initial encounter for closed fracture: Secondary | ICD-10-CM | POA: Diagnosis not present

## 2020-09-11 LAB — GLUCOSE HEMOCUE WAIVED: Glu Hemocue Waived: 202 mg/dL — ABNORMAL HIGH (ref 65–99)

## 2020-09-11 MED ORDER — GLUCOSE BLOOD VI STRP: ORAL_STRIP | 12 refills | Status: AC

## 2020-09-11 NOTE — Assessment & Plan Note (Signed)
Better controlled though not quite to goal. Continue increased lantus at 35u. Encouraged regular QID CBG checks with preprandial checks. If preprandial CBGs >200, recommend 15u humalog with meals. Continue metformin. F/u in one month.

## 2020-09-11 NOTE — Assessment & Plan Note (Addendum)
Improved with better glucose control though still with vertigo symptoms. BP also better.  Awaiting vestibular rehab.

## 2020-09-11 NOTE — Patient Instructions (Addendum)
It was great to see you!  Our plans for today:  - Call [phone] to get set up with an appointment for Cardiology. - If you don't hear about an appointment with physical therapy in the next few days, let us know. - We will let you know the results of your xray. - Check your blood sugar prior to eating. If blood sugars are greater than 200 before eating, increase your humalog to 15 units with meals.  - Try a mouthguard for your jaw.  - Follow up in one month. Bring your blood sugar meter with you when you come.   We are checking some labs today, we will release these results to your MyChart.  Take care and seek immediate care sooner if you develop any concerns.  Dr. Linwood Dibbles

## 2020-09-11 NOTE — Progress Notes (Signed)
   SUBJECTIVE:   CHIEF COMPLAINT / HPI:   Past Medical History:  Diagnosis Date  . Chronic kidney disease   . Hyperlipidemia   . Hypertension   . Retinopathy due to secondary diabetes (HCC)    Diabetes, Type 2 - seen previously for dizziness, glucose 734 at that time with 3+ glucose on UA and +orthostatics. - Last A1c 10.4 07/23/20 - Medications: lantus 30u daily, humalog 10u TID with meals, metformin 750mg  daily (instructed to give additional 5u humalog for every CBG >180 over the weekend) - Compliance: 35u lantus in am, 10u with meals.   - Checking BG at home: yes, CBGs 200s - Eye exam: UTD with retinopathy - Microalbumin: due - Statin: yes - Denies symptoms of hypoglycemia, polyuria, polydipsia, numbness extremities, foot ulcers/trauma - hasn't eaten today.   Dizziness - seen previously for dizziness, glucose 734 at that time with 3+ glucose on UA and +orthostatics. Made adjustments to diabetic regimen and referred to vestibular rehab. - improved with better diabetic control though still with dizziness when turning head.   Recent fall - recent fall 12/24, tripped over recliner and hit L side and R chin. Denies dizziness at that time. CBG 300s at that time. - hit L side, R side of chin.   OBJECTIVE:   BP 121/81   Pulse 60   Temp (!) 97.5 F (36.4 C)   SpO2 97%   Gen: elderly, in NAD HEENT: TMs visible without erythema, bulging, purulence. External canal clear.  Cardiac: RRR, no murmur Lungs: CTAB Ext: WWP, no edema   ASSESSMENT/PLAN:   Insulin dependent type 2 diabetes mellitus (HCC) Better controlled though not quite to goal. Continue increased lantus at 35u. Encouraged regular QID CBG checks with preprandial checks. If preprandial CBGs >200, recommend 15u humalog with meals. Continue metformin. F/u in one month.  Dizziness Improved with better glucose control though still with vertigo symptoms. BP also better.  Awaiting vestibular rehab.    1/25,  DO

## 2020-09-12 ENCOUNTER — Telehealth: Payer: Self-pay | Admitting: Family Medicine

## 2020-09-12 ENCOUNTER — Telehealth: Payer: Self-pay

## 2020-09-12 LAB — BASIC METABOLIC PANEL
BUN/Creatinine Ratio: 21 (ref 10–24)
BUN: 26 mg/dL (ref 8–27)
CO2: 24 mmol/L (ref 20–29)
Calcium: 9.7 mg/dL (ref 8.6–10.2)
Chloride: 102 mmol/L (ref 96–106)
Creatinine, Ser: 1.25 mg/dL (ref 0.76–1.27)
GFR calc Af Amer: 66 mL/min/{1.73_m2} (ref >59)
GFR calc non Af Amer: 57 mL/min/{1.73_m2} — ABNORMAL LOW (ref >59)
Glucose: 233 mg/dL — ABNORMAL HIGH (ref 65–99)
Potassium: 4.6 mmol/L (ref 3.5–5.2)
Sodium: 138 mmol/L (ref 134–144)

## 2020-09-12 NOTE — Telephone Encounter (Signed)
Patient called to discuss DG Ribs Unilateral Left (Accession [id number]) (Order [id number]) Please advise C [phone]

## 2020-09-12 NOTE — Telephone Encounter (Signed)
Please see other telephone encounter on this. We are waiting on a response from the provider. Imaging has not been reviewed yet by her.

## 2020-09-12 NOTE — Telephone Encounter (Signed)
Copied from CRM [id number]. Topic: General - Other >> Sep 12, 2020 11:05 AM Herby Abraham C wrote: Reason for CRM: pt called in for imaging results.    Please assist pt further.

## 2020-09-12 NOTE — Telephone Encounter (Signed)
Routing to provider for results.

## 2020-09-12 NOTE — Telephone Encounter (Signed)
Spoke to patient regarding results, see separate result notes.

## 2020-10-09 ENCOUNTER — Ambulatory Visit: Payer: Medicare Other | Admitting: Family Medicine

## 2020-10-10 ENCOUNTER — Ambulatory Visit: Payer: Medicare Other | Admitting: Family Medicine

## 2020-10-15 ENCOUNTER — Ambulatory Visit: Payer: Medicare Other | Admitting: Nurse Practitioner

## 2020-10-15 NOTE — Progress Notes (Deleted)
   There were no vitals taken for this visit.   Subjective:    Patient ID: Jack Estrada, male    DOB: [birth year], 75 y.o.   MRN: [id number]  HPI: Jack Estrada is a 75 y.o. male  No chief complaint on file.  Patient seen today to follow up on diabetes and dizziness.  Patient's most recent A1c was 10.4 in November.  Patient was started on Insulin 1 month ago to help control hyperglycemia.  Oatient reports  DIABETES Hypoglycemic episodes:{Blank single:19197::"yes","no"} Polydipsia/polyuria: {Blank single:19197::"yes","no"} Visual disturbance: {Blank single:19197::"yes","no"} Chest pain: {Blank single:19197::"yes","no"} Paresthesias: {Blank single:19197::"yes","no"} Glucose Monitoring: {Blank single:19197::"yes","no"}  Accucheck frequency: {Blank single:19197::"Not Checking","Daily","BID","TID"}  Fasting glucose:  Post prandial:  Evening:  Before meals: Taking Insulin?: {Blank single:19197::"yes","no"}  Long acting insulin:  Short acting insulin: Blood Pressure Monitoring: {Blank single:19197::"not checking","rarely","daily","weekly","monthly","a few times a day","a few times a week","a few times a month"} Retinal Examination: {Blank single:19197::"Up to Date","Not up to Date"} Foot Exam: {Blank single:19197::"Up to Date","Not up to Date"} Diabetic Education: {Blank single:19197::"Completed","Not Completed"} Pneumovax: {Blank single:19197::"Up to Date","Not up to Date","unknown"} Influenza: {Blank single:19197::"Up to Date","Not up to Date","unknown"} Aspirin: {Blank single:19197::"yes","no"}  DIZZINESS Duration: {Blank single:19197::"days","weeks","months"} Description of symptoms: {Blank single:19197::"lightheaded","off kilter","ill-defined","room spinning","not room spinning"} Duration of episode: {Blank single:19197::"seconds","minutes","hours"} Dizziness frequency: {Blank single:19197::"no history of the same","recurrent"} Provoking factors: {Blank  single:19197::"none"} Aggravating factors:  {Blank single:19197::"none"} Triggered by rolling over in bed: {Blank single:19197::"yes","no"} Triggered by bending over: {Blank single:19197::"yes","no"} Aggravated by head movement: {Blank single:19197::"yes","no"} Aggravated by exertion, coughing, loud noises: {Blank single:19197::"yes","no"} Recent head injury: {Blank single:19197::"yes","no"} Recent or current viral symptoms: {Blank single:19197::"yes","no"} History of vasovagal episodes: {Blank single:19197::"yes","no"} Nausea: {Blank single:19197::"yes","no"} Vomiting: {Blank single:19197::"yes","no"} Tinnitus: {Blank single:19197::"yes","no"} Hearing loss: {Blank single:19197::"yes","no"} Aural fullness: {Blank single:19197::"yes","no"} Headache: {Blank single:19197::"yes","no"} Photophobia/phonophobia: {Blank single:19197::"yes","no"} Unsteady gait: {Blank single:19197::"yes","no"} Postural instability: {Blank single:19197::"yes","no"} Diplopia, dysarthria, dysphagia or weakness: {Blank single:19197::"yes","no"} Related to exertion: {Blank single:19197::"yes","no"} Pallor: {Blank single:19197::"yes","no"} Diaphoresis: {Blank single:19197::"yes","no"} Dyspnea: {Blank single:19197::"yes","no"} Chest pain: {Blank single:19197::"yes","no"}  Relevant past medical, surgical, family and social history reviewed and updated as indicated. Interim medical history since our last visit reviewed. Allergies and medications reviewed and updated.  Review of Systems  Per HPI unless specifically indicated above     Objective:    There were no vitals taken for this visit.  Wt Readings from Last 3 Encounters:  09/04/20 199 lb (90.3 kg)  07/27/20 215 lb 6.4 oz (97.7 kg)  05/28/20 214 lb 9.6 oz (97.3 kg)    Physical Exam  Results for orders placed or performed in visit on 09/11/20  Glucose Hemocue Waived (STAT)  Result Value Ref Range   Glu Hemocue Waived 202 (H) 65 - 99 mg/dL  Basic  Metabolic Panel (BMET)  Result Value Ref Range   Glucose 233 (H) 65 - 99 mg/dL   BUN 26 8 - 27 mg/dL   Creatinine, Ser 1.25 0.76 - 1.27 mg/dL   GFR calc non Af Amer 57 (L) >59 mL/min/1.73   GFR calc Af Amer 66 >59 mL/min/1.73   BUN/Creatinine Ratio 21 10 - 24   Sodium 138 134 - 144 mmol/L   Potassium 4.6 3.5 - 5.2 mmol/L   Chloride 102 96 - 106 mmol/L   CO2 24 20 - 29 mmol/L   Calcium 9.7 8.6 - 10.2 mg/dL      Assessment & Plan:   Problem List Items Addressed This Visit   None      Follow up plan: No follow-ups on file.

## 2020-11-19 ENCOUNTER — Telehealth: Payer: Self-pay | Admitting: Internal Medicine

## 2020-11-19 NOTE — Telephone Encounter (Signed)
Pt called stating that he went to go pick up his lantus pen, and that it has went up in price. He states that he has always paid $35 for these, but that they are charging $70 now. He states that he spoke with insurance agent this morning and they state that it should not have changed. Please advise.

## 2020-11-20 NOTE — Telephone Encounter (Signed)
Unable to leave a voice message as patient voicemail box is full.

## 2020-11-21 NOTE — Telephone Encounter (Signed)
Attempted to contact patient for the second time and was unable to leave a message for patient due to voicemail box being full.

## 2020-11-22 NOTE — Telephone Encounter (Signed)
Called patient, no answer, unable to leave a message, will try again.   

## 2020-12-10 ENCOUNTER — Other Ambulatory Visit: Payer: Self-pay

## 2020-12-10 ENCOUNTER — Ambulatory Visit (INDEPENDENT_AMBULATORY_CARE_PROVIDER_SITE_OTHER): Payer: Medicare Other | Admitting: Internal Medicine

## 2020-12-10 ENCOUNTER — Encounter: Payer: Self-pay | Admitting: Internal Medicine

## 2020-12-10 ENCOUNTER — Ambulatory Visit: Payer: Medicare Other | Admitting: Family Medicine

## 2020-12-10 VITALS — BP 136/77 | HR 63 | Temp 98.0°F | Ht 72.24 in | Wt 213.4 lb

## 2020-12-10 DIAGNOSIS — M549 Dorsalgia, unspecified: Secondary | ICD-10-CM | POA: Diagnosis not present

## 2020-12-10 DIAGNOSIS — M25562 Pain in left knee: Secondary | ICD-10-CM | POA: Diagnosis not present

## 2020-12-10 DIAGNOSIS — E119 Type 2 diabetes mellitus without complications: Secondary | ICD-10-CM | POA: Diagnosis not present

## 2020-12-10 DIAGNOSIS — M25552 Pain in left hip: Secondary | ICD-10-CM

## 2020-12-10 DIAGNOSIS — M25512 Pain in left shoulder: Secondary | ICD-10-CM | POA: Diagnosis not present

## 2020-12-10 DIAGNOSIS — M25561 Pain in right knee: Secondary | ICD-10-CM | POA: Diagnosis not present

## 2020-12-10 DIAGNOSIS — G8929 Other chronic pain: Secondary | ICD-10-CM

## 2020-12-10 MED ORDER — TOUJEO SOLOSTAR 300 UNIT/ML ~~LOC~~ SOPN: 50.0000 [IU] | PEN_INJECTOR | Freq: Every day | SUBCUTANEOUS | 6 refills | Status: DC

## 2020-12-10 MED ORDER — METFORMIN HCL 500 MG PO TABS: 500.0000 mg | ORAL_TABLET | Freq: Two times a day (BID) | ORAL | 3 refills | Status: AC

## 2020-12-10 MED ORDER — CITALOPRAM HYDROBROMIDE 10 MG PO TABS: 10.0000 mg | ORAL_TABLET | Freq: Every day | ORAL | 3 refills | Status: DC

## 2020-12-10 MED ORDER — DAPAGLIFLOZIN PROPANEDIOL 5 MG PO TABS: 5.0000 mg | ORAL_TABLET | Freq: Every day | ORAL | 2 refills | Status: AC

## 2020-12-10 NOTE — Progress Notes (Signed)
BP 136/77   Pulse 63   Temp 98 F (36.7 C) (Oral)   Ht 6' 0.24" (1.835 m)   Wt 213 lb 6.4 oz (96.8 kg)   SpO2 98%   BMI 28.75 kg/m    Subjective:    Patient ID: Jack Estrada, male    DOB: 1946/01/26, 75 y.o.   MRN: [id number]  HPI: Jack Estrada is a 75 y.o. male  Depression        This is a chronic (has been irritable recently and hasnt been himself. went through a seperation three years ago and not doing well with himself) problem.  Associated symptoms include irritable and decreased interest. Fall Incident onset: in dec and late dec / jan had three broken ribs.  better now. can sleep   Diabetes He presents for his follow-up diabetic visit.    Chief Complaint  Patient presents with  . Depression    Relevant past medical, surgical, family and social history reviewed and updated as indicated. Interim medical history since our last visit reviewed. Allergies and medications reviewed and updated.  Review of Systems  Psychiatric/Behavioral: Positive for depression.    Per HPI unless specifically indicated above     Objective:    BP 136/77   Pulse 63   Temp 98 F (36.7 C) (Oral)   Ht 6' 0.24" (1.835 m)   Wt 213 lb 6.4 oz (96.8 kg)   SpO2 98%   BMI 28.75 kg/m   Wt Readings from Last 3 Encounters:  12/10/20 213 lb 6.4 oz (96.8 kg)  09/04/20 199 lb (90.3 kg)  07/27/20 215 lb 6.4 oz (97.7 kg)    Physical Exam Constitutional:      General: He is irritable.     Results for orders placed or performed in visit on 09/11/20  Glucose Hemocue Waived (STAT)  Result Value Ref Range   Glu Hemocue Waived 202 (H) 65 - 99 mg/dL  Basic Metabolic Panel (BMET)  Result Value Ref Range   Glucose 233 (H) 65 - 99 mg/dL   BUN 26 8 - 27 mg/dL   Creatinine, Ser 1.25 0.76 - 1.27 mg/dL   GFR calc non Af Amer 57 (L) >59 mL/min/1.73   GFR calc Af Amer 66 >59 mL/min/1.73   BUN/Creatinine Ratio 21 10 - 24   Sodium 138 134 - 144 mmol/L   Potassium 4.6 3.5 - 5.2 mmol/L   Chloride  102 96 - 106 mmol/L   CO2 24 20 - 29 mmol/L   Calcium 9.7 8.6 - 10.2 mg/dL        Current Outpatient Medications:  .  atorvastatin (LIPITOR) 40 MG tablet, Take 1 tablet (40 mg total) by mouth daily., Disp: 90 tablet, Rfl: 1 .  blood glucose meter kit and supplies KIT, Dispense based on patient and insurance preference. Use up to four times daily as directed. (FOR ICD-9 250.00, 250.01)., Disp: 1 each, Rfl: 0 .  insulin lispro (HUMALOG KWIKPEN) 100 UNIT/ML KwikPen, Inject 10 Units into the skin 3 (three) times daily., Disp: 30 mL, Rfl: 2 .  LANTUS SOLOSTAR 100 UNIT/ML Solostar Pen, Inject 20-30 Units into the skin daily., Disp: 15 mL, Rfl: 3 .  glucose blood test strip, Use as instructed, Disp: 100 each, Rfl: 12 .  sildenafil (VIAGRA) 50 MG tablet, Take by mouth., Disp: , Rfl:     Assessment & Plan:  1. DM  : a1c was 10.4  a1c - recheck Is on lantus 35 untis and humalog  10 untis tid.  Start farxiga 5 mg and metformin bid and reduce humalog 10 untis bid.  check HbA1c,  urine  microalbumin  diabetic diet plan given to pt  adviced regarding hypoglycemia and instructions given to pt today on how to prevent and treat the same if it were to occur. pt acknowledges the plan and voices understanding of the same.  exercise plan given and encouraged.   advice diabetic yearly podiatry, ophthalmology , nutritionist , dental check q 6 months   2 Depression : start pt on celexa 10 mg.  Will need to fu in2- 3 weeks.   3. Multiple joint pain : consider steorid injections.  I have already ordered this as a future order in the computer.  Orders Placed This Encounter  Procedures  . DG Lumbar Spine Complete    For back pain    Order Specific Question:   Reason for Exam (SYMPTOM  OR DIAGNOSIS REQUIRED)    Answer:   back pain    Order Specific Question:   Preferred imaging location?    Answer:   [facility]  . DG HIP UNILAT W OR W/O PELVIS 2-3 VIEWS LEFT    Standing Status:   Future    Standing  Expiration Date:   01/10/2021    Order Specific Question:   Reason for Exam (SYMPTOM  OR DIAGNOSIS REQUIRED)    Answer:   left hip pain    Order Specific Question:   Preferred imaging location?    Answer:   [facility]    Order Specific Question:   Radiology Contrast Protocol - do NOT remove file path    Answer:   \\[email or web address].[email or web address]  . DG KNEE 3 VIEW LEFT    Order Specific Question:   Reason for Exam (SYMPTOM  OR DIAGNOSIS REQUIRED)    Answer:   left knee pain    Order Specific Question:   Preferred imaging location?    Answer:   [facility]    Order Specific Question:   Radiology Contrast Protocol - do NOT remove file path    Answer:   \\[email or web address].pdf  . DG KNEE 3 VIEW RIGHT    Standing Status:   Future    Standing Expiration Date:   01/10/2021    Order Specific Question:   Reason for Exam (SYMPTOM  OR DIAGNOSIS REQUIRED)    Answer:   knee pain    Order Specific Question:   Preferred imaging location?    Answer:   [facility]    Order Specific Question:   Radiology Contrast Protocol - do NOT remove file path    Answer:   \\[email or web address].pdf  . DG Shoulder Left    Order Specific Question:   Reason for Exam (SYMPTOM  OR DIAGNOSIS REQUIRED)    Answer:   shoulder pain    Order Specific Question:   Preferred imaging location?    Answer:   [facility] Hb A1c Waived  . Basic metabolic panel   Problem List Items Addressed This Visit   None      Follow up plan: No follow-ups on file.

## 2020-12-11 LAB — BASIC METABOLIC PANEL
BUN/Creatinine Ratio: 18 (ref 10–24)
BUN: 21 mg/dL (ref 8–27)
CO2: 21 mmol/L (ref 20–29)
Calcium: 9.8 mg/dL (ref 8.6–10.2)
Chloride: 102 mmol/L (ref 96–106)
Creatinine, Ser: 1.14 mg/dL (ref 0.76–1.27)
Glucose: 284 mg/dL — ABNORMAL HIGH (ref 65–99)
Potassium: 4.6 mmol/L (ref 3.5–5.2)
Sodium: 137 mmol/L (ref 134–144)
eGFR: 68 mL/min/{1.73_m2} (ref >59)

## 2020-12-11 LAB — BAYER DCA HB A1C WAIVED: HB A1C (BAYER DCA - WAIVED): 11.8 % — ABNORMAL HIGH (ref <7.0)

## 2020-12-11 NOTE — Progress Notes (Signed)
A1c at 11.6 pl let him know thnx. Will need to fu with me x 1 week

## 2020-12-17 ENCOUNTER — Ambulatory Visit: Payer: Medicare Other | Admitting: Nurse Practitioner

## 2021-01-02 ENCOUNTER — Ambulatory Visit: Payer: Medicare Other | Admitting: Internal Medicine

## 2021-01-09 ENCOUNTER — Other Ambulatory Visit: Payer: Self-pay | Admitting: Internal Medicine

## 2021-01-09 DIAGNOSIS — G8929 Other chronic pain: Secondary | ICD-10-CM

## 2021-01-09 DIAGNOSIS — M25562 Pain in left knee: Secondary | ICD-10-CM

## 2021-01-23 DIAGNOSIS — L570 Actinic keratosis: Secondary | ICD-10-CM | POA: Diagnosis not present

## 2021-01-23 DIAGNOSIS — D239 Other benign neoplasm of skin, unspecified: Secondary | ICD-10-CM | POA: Diagnosis not present

## 2021-02-07 ENCOUNTER — Telehealth: Payer: Self-pay | Admitting: Internal Medicine

## 2021-02-07 NOTE — Telephone Encounter (Signed)
Copied from [place] [id number]. Topic: Medicare AWV >> Feb 07, 2021 10:33 AM Cher Nakai R wrote: Reason for CRM:  02/07/21- No answer & mailbox is full re: AWVS will be by phone not in the office on Feb 08, 2021 at 2:30-srs

## 2021-02-08 ENCOUNTER — Telehealth: Payer: Self-pay

## 2021-02-08 ENCOUNTER — Ambulatory Visit: Payer: Medicare Other

## 2021-02-08 NOTE — Telephone Encounter (Signed)
This nurse called patient for telephonic AWV. He states that he is painting the house and can not do at this time. He'd like a call at a later date to reschedule.

## 2021-02-13 ENCOUNTER — Ambulatory Visit: Payer: Medicare Other

## 2021-02-20 ENCOUNTER — Other Ambulatory Visit: Payer: Self-pay | Admitting: Family Medicine

## 2021-02-20 NOTE — Telephone Encounter (Signed)
Can one of you look into this refill for the patient please?

## 2021-02-20 NOTE — Telephone Encounter (Signed)
   Notes to clinic:  medication filled by a historical provider  Review for refill    Requested Prescriptions  Pending Prescriptions Disp Refills   sildenafil (VIAGRA) 50 MG tablet [Pharmacy Med Name: SILDENAFIL 50 MG TABLET] 15 tablet 0    Sig: Take 1-2 tablets (50-100 mg total) by mouth daily as needed for erectile dysfunction.      Urology: Erectile Dysfunction Agents Passed - 02/20/2021  1:41 PM      Passed - Last BP in normal range    BP Readings from Last 1 Encounters:  12/10/20 136/77          Passed - Valid encounter within last 12 months    Recent Outpatient Visits           2 months ago Diabetes mellitus without complication ([place])   [facility] Vigg, Avanti, MD   5 months ago Dizziness   [place], DO   5 months ago Syncope, unspecified syncope type   [place], DO   8 months ago Left hip pain   [place], Henrine Screws T, NP   9 months ago Impacted cerumen of right ear   [facility] [place], [place]

## 2021-02-28 ENCOUNTER — Encounter: Payer: Self-pay | Admitting: Internal Medicine

## 2021-02-28 ENCOUNTER — Telehealth (INDEPENDENT_AMBULATORY_CARE_PROVIDER_SITE_OTHER): Payer: Medicare Other | Admitting: Internal Medicine

## 2021-02-28 DIAGNOSIS — F419 Anxiety disorder, unspecified: Secondary | ICD-10-CM

## 2021-02-28 MED ORDER — CITALOPRAM HYDROBROMIDE 10 MG PO TABS: 10.0000 mg | ORAL_TABLET | Freq: Every day | ORAL | 3 refills | Status: AC

## 2021-02-28 NOTE — Progress Notes (Signed)
There were no vitals taken for this visit.   Subjective:    Patient ID: Jack Estrada, male    DOB: 1945/12/17, 75 y.o.   MRN: [id number]  Chief Complaint  Patient presents with   bad mood changes    Cant stand himself, has had a lot of changes in the past 3 years.     HPI: Jack Estrada is a 75 y.o. male   This visit was completed via telephone due to the restrictions of the COVID-19 pandemic. All issues as above were discussed and addressed but no physical exam was performed. If it was felt that the patient should be evaluated in the office, they were directed there. The patient verbally consented to this visit. Patient was unable to complete an audio/visual visit due to Technical difficulties. Due to the catastrophic nature of the COVID-19 pandemic, this visit was done through audio contact only. Location of the patient: home Location of the provider: work Those involved with this call:  Provider: Charlynne Cousins, MD CMA: Frazier Butt, CMA Front Desk/Registration: Roe Rutherford  Time spent on call: 5 minutes on the phone discussing health concerns. 8 minutes total spent in review of patient's record and preparation of their chart.    Depression        This is a recurrent (seperated 3 years and gets ill so bad , is on celexa 10 mg) problem.  Chief Complaint  Patient presents with   bad mood changes    Cant stand himself, has had a lot of changes in the past 3 years.     Relevant past medical, surgical, family and social history reviewed and updated as indicated. Interim medical history since our last visit reviewed. Allergies and medications reviewed and updated.  Review of Systems  Psychiatric/Behavioral:  Positive for depression.    Per HPI unless specifically indicated above     Objective:    There were no vitals taken for this visit.  Wt Readings from Last 3 Encounters:  12/10/20 213 lb 6.4 oz (96.8 kg)  09/04/20 199 lb (90.3 kg)  07/27/20 215 lb 6.4 oz (97.7  kg)    Physical Exam Vitals reviewed: not done since virtual.    Results for orders placed or performed in visit on 12/10/20  Bayer DCA Hb A1c Waived  Result Value Ref Range   HB A1C (BAYER DCA - WAIVED) 11.8 (H) <3.8 %  Basic metabolic panel  Result Value Ref Range   Glucose 284 (H) 65 - 99 mg/dL   BUN 21 8 - 27 mg/dL   Creatinine, Ser 1.14 0.76 - 1.27 mg/dL   eGFR 68 >59 mL/min/1.73   BUN/Creatinine Ratio 18 10 - 24   Sodium 137 134 - 144 mmol/L   Potassium 4.6 3.5 - 5.2 mmol/L   Chloride 102 96 - 106 mmol/L   CO2 21 20 - 29 mmol/L   Calcium 9.8 8.6 - 10.2 mg/dL        Current Outpatient Medications:    atorvastatin (LIPITOR) 40 MG tablet, Take 1 tablet (40 mg total) by mouth daily., Disp: 90 tablet, Rfl: 1   insulin lispro (HUMALOG KWIKPEN) 100 UNIT/ML KwikPen, Inject 10 Units into the skin 3 (three) times daily., Disp: 30 mL, Rfl: 2   sildenafil (VIAGRA) 50 MG tablet, Take by mouth., Disp: , Rfl:    blood glucose meter kit and supplies KIT, Dispense based on patient and insurance preference. Use up to four times daily as directed. (FOR ICD-9 250.00,  250.01)., Disp: 1 each, Rfl: 0   citalopram (CELEXA) 10 MG tablet, Take 1 tablet (10 mg total) by mouth daily. (Patient not taking: Reported on 02/28/2021), Disp: 30 tablet, Rfl: 3   dapagliflozin propanediol (FARXIGA) 5 MG TABS tablet, Take 1 tablet (5 mg total) by mouth daily before breakfast., Disp: 30 tablet, Rfl: 2   glucose blood test strip, Use as instructed (Patient not taking: Reported on 02/28/2021), Disp: 100 each, Rfl: 12   insulin glargine, 1 Unit Dial, (TOUJEO SOLOSTAR) 300 UNIT/ML Solostar Pen, Inject 50 Units into the skin daily. (Patient not taking: Reported on 02/28/2021), Disp: 100 mL, Rfl: 6   LANTUS SOLOSTAR 100 UNIT/ML Solostar Pen, ADMINISTER 50 UNITS UNDER THE SKIN EVERY MORNING, Disp: , Rfl:    metFORMIN (GLUCOPHAGE) 500 MG tablet, Take 1 tablet (500 mg total) by mouth 2 (two) times daily with a meal. (Patient  not taking: Reported on 02/28/2021), Disp: 60 tablet, Rfl: 3    Assessment & Plan:  Anxiety :  Was on celexa didn't take his meds as he " doesn't remember if he misplaced his meds " and has been on this Will resend celexa Not sure if phone got disconnected / he hung up however, will resend the celexa Pt was rude on the phone and not sure what his intentions were from what he wanted at this visit.   Problem List Items Addressed This Visit   None    No orders of the defined types were placed in this encounter.    Meds ordered this encounter  Medications   citalopram (CELEXA) 10 MG tablet    Sig: Take 1 tablet (10 mg total) by mouth daily.    Dispense:  30 tablet    Refill:  3     Follow up plan: No follow-ups on file.

## 2021-03-21 DIAGNOSIS — I10 Essential (primary) hypertension: Secondary | ICD-10-CM | POA: Diagnosis not present

## 2021-03-21 DIAGNOSIS — E785 Hyperlipidemia, unspecified: Secondary | ICD-10-CM | POA: Diagnosis not present

## 2021-03-21 DIAGNOSIS — K219 Gastro-esophageal reflux disease without esophagitis: Secondary | ICD-10-CM | POA: Diagnosis not present

## 2021-03-21 DIAGNOSIS — E1169 Type 2 diabetes mellitus with other specified complication: Secondary | ICD-10-CM | POA: Diagnosis not present

## 2021-03-21 DIAGNOSIS — Z794 Long term (current) use of insulin: Secondary | ICD-10-CM | POA: Diagnosis not present

## 2021-04-08 ENCOUNTER — Other Ambulatory Visit: Payer: Self-pay

## 2021-04-08 MED ORDER — SILDENAFIL CITRATE 50 MG PO TABS: 50.0000 mg | ORAL_TABLET | ORAL | 1 refills | Status: DC | PRN

## 2021-04-22 ENCOUNTER — Other Ambulatory Visit: Payer: Self-pay | Admitting: Internal Medicine

## 2021-04-22 ENCOUNTER — Other Ambulatory Visit: Payer: Self-pay | Admitting: Family Medicine

## 2021-04-22 MED ORDER — INSULIN LISPRO (1 UNIT DIAL) 100 UNIT/ML (KWIKPEN): 10.0000 [IU] | PEN_INJECTOR | Freq: Three times a day (TID) | SUBCUTANEOUS | 2 refills | Status: AC

## 2021-04-22 NOTE — Telephone Encounter (Signed)
Medication Refill - Medication: humelogue and 5 of LANTUS SOLOSTAR 100 UNIT/ML Solostar Pen   Has the patient contacted their pharmacy? yes (Agent: If no, request that the patient contact the pharmacy for the refill.) (Agent: If yes, when and what did the pharmacy advise?)contact pcp  Preferred Pharmacy (with phone number or street name): [facility] [id number]  [place], [place] [address]  Phone:  [phone]  Fax:  [phone]   Agent: Please be advised that RX refills may take up to 3 business days. We ask that you follow-up with your pharmacy.

## 2021-04-22 NOTE — Telephone Encounter (Signed)
  Requested medication (s) are on the active medication list:  yes   Last refill: 04/08/2021  Future visit scheduled: yes   Notes to clinic:  Script last filled on 04/08/2021 for 10 tabs with refill Review for continued refills    Requested Prescriptions  Pending Prescriptions Disp Refills   sildenafil (VIAGRA) 50 MG tablet [Pharmacy Med Name: SILDENAFIL 50 MG TABLET] 15 tablet 0    Sig: Take 1-2 tablets (50-100 mg total) by mouth daily as needed for erectile dysfunction.      Urology: Erectile Dysfunction Agents Passed - 04/22/2021  2:24 PM      Passed - Last BP in normal range    BP Readings from Last 1 Encounters:  12/10/20 136/77          Passed - Valid encounter within last 12 months    Recent Outpatient Visits           1 month ago [place], MD   4 months ago Diabetes mellitus without complication ([place])   [place] Vigg, Avanti, MD   7 months ago Dizziness   [place], DO   7 months ago Syncope, unspecified syncope type   [place], DO   10 months ago Left hip pain   [facility] [place], Barbaraann Faster, NP

## 2021-04-22 NOTE — Telephone Encounter (Signed)
Patient called the office and spoke to [facility] agent, Juliann Pulse. Juliann Pulse states that the patient was very upset that the medication had been sent to the wrong pharmacy. Patient states that he requested that the medication be sent to [facility] but when he went to the pharmacy it was not there.  [facility] contacted and spoke with the pharmacist, Page. Pharmacist requested to take the order over the phone instead of calling [facility] for transfer. Verbal orders for Sildenafil  50 mg tab #10 with 1 refill, Take one tablet (50 mg) by mouth as needed for erectile dysfunction (30 mins prior to sexual activity) per previous order by Dr. Charlynne Cousins on 04/08/21. Understanding verbalized.

## 2021-04-22 NOTE — Telephone Encounter (Signed)
Requested medication (s) are due for refill today - yes  Requested medication (s) are on the active medication list -yes  Future visit scheduled -no  Last refill: 02/20/21  Notes to clinic: Request RF- historical provider  Requested Prescriptions  Pending Prescriptions Disp Refills   LANTUS SOLOSTAR 100 UNIT/ML Solostar Pen [Pharmacy Med Name: LANTUS SOLOSTAR PEN INJ 3ML] 15 mL     Sig: ADMINISTER [place]      Endocrinology:  Diabetes - Insulins Failed - 04/22/2021  3:32 PM      Failed - HBA1C is between 0 and 7.9 and within 180 days    HB A1C (BAYER DCA - WAIVED)  Date Value Ref Range Status  12/10/2020 11.8 (H) <7.0 % Final    Comment:                                          Diabetic Adult            <7.0                                       Healthy Adult        4.3 - 5.7                                                           (DCCT/NGSP) American Diabetes Association's Summary of Glycemic Recommendations for Adults with Diabetes: Hemoglobin A1c <7.0%. More stringent glycemic goals (A1c <6.0%) may further reduce complications at the cost of increased risk of hypoglycemia.           Passed - Valid encounter within last 6 months    Recent Outpatient Visits           1 month ago [place] Vigg, Avanti, MD   4 months ago Diabetes mellitus without complication ([place])   [place] Vigg, Avanti, MD   7 months ago Dizziness   [place], DO   7 months ago Syncope, unspecified syncope type   [facility] Rory Percy M, DO   10 months ago Left hip pain   [place], Barbaraann Faster, NP                    Requested Prescriptions  Pending Prescriptions Disp Refills   LANTUS SOLOSTAR 100 UNIT/ML Solostar Pen [Pharmacy Med Name: LANTUS SOLOSTAR PEN INJ 3ML] 15 mL     Sig: ADMINISTER [place]       Endocrinology:  Diabetes - Insulins Failed - 04/22/2021  3:32 PM      Failed - HBA1C is between 0 and 7.9 and within 180 days    HB A1C (BAYER DCA - WAIVED)  Date Value Ref Range Status  12/10/2020 11.8 (H) <7.0 % Final    Comment:  Diabetic Adult            <7.0                                       Healthy Adult        4.3 - 5.7                                                           (DCCT/NGSP) American Diabetes Association's Summary of Glycemic Recommendations for Adults with Diabetes: Hemoglobin A1c <7.0%. More stringent glycemic goals (A1c <6.0%) may further reduce complications at the cost of increased risk of hypoglycemia.           Passed - Valid encounter within last 6 months    Recent Outpatient Visits           1 month ago [place], MD   4 months ago Diabetes mellitus without complication ([place])   [place] Vigg, Avanti, MD   7 months ago Dizziness   [place], DO   7 months ago Syncope, unspecified syncope type   [place], DO   10 months ago Left hip pain   [facility] [place], Barbaraann Faster, NP

## 2021-04-23 ENCOUNTER — Telehealth: Payer: Self-pay

## 2021-04-23 ENCOUNTER — Other Ambulatory Visit: Payer: Self-pay

## 2021-04-23 MED ORDER — SILDENAFIL CITRATE 50 MG PO TABS: 50.0000 mg | ORAL_TABLET | ORAL | 0 refills | Status: AC | PRN

## 2021-04-23 NOTE — Telephone Encounter (Signed)
Patient states he was able to pick up his prescriptions yesterday and thanks the provider.

## 2021-04-23 NOTE — Telephone Encounter (Signed)
Copied from [place] [phone]. Topic: General - Other >> Apr 22, 2021  3:48 PM Bayard Beaver wrote: Reason for [id number] asking for short supply of hupeloge and LANTUS SOLOSTAR 100 UNIT/ML Solostar Pen  until refill comes in

## 2021-04-30 DIAGNOSIS — I10 Essential (primary) hypertension: Secondary | ICD-10-CM | POA: Diagnosis not present

## 2021-04-30 DIAGNOSIS — Z794 Long term (current) use of insulin: Secondary | ICD-10-CM | POA: Diagnosis not present

## 2021-04-30 DIAGNOSIS — F32A Depression, unspecified: Secondary | ICD-10-CM | POA: Diagnosis not present

## 2021-04-30 DIAGNOSIS — E785 Hyperlipidemia, unspecified: Secondary | ICD-10-CM | POA: Diagnosis not present

## 2021-04-30 DIAGNOSIS — E1169 Type 2 diabetes mellitus with other specified complication: Secondary | ICD-10-CM | POA: Diagnosis not present

## 2021-08-07 ENCOUNTER — Ambulatory Visit: Payer: Medicare Other

## 2021-10-18 ENCOUNTER — Telehealth: Payer: Self-pay | Admitting: Internal Medicine

## 2021-10-18 NOTE — Telephone Encounter (Signed)
Copied from [place] [id number]. Topic: Medicare AWV >> Oct 18, 2021  2:34 PM Lavonia Drafts wrote: Reason for CRM:  Left message for patient to call back and schedule the Medicare Annual Wellness Visit (AWV) virtually or by telephone.  Last AWV 02/08/20  Please schedule at anytime with CFP-Nurse Health Advisor.  45 minute appointment  Any questions, please call me at [phone]

## 2021-12-02 ENCOUNTER — Other Ambulatory Visit: Payer: Self-pay | Admitting: Internal Medicine

## 2021-12-04 NOTE — Telephone Encounter (Signed)
Requested medications are due for refill today.  yes ? ?Requested medications are on the active medications list.  yes ? ?Last refill. 04/22/2021 15 mL 3 refills ? ?Future visit scheduled.   Yes - tomorrow ? ?Notes to clinic.  Labs are out of date. ? ? ? ?Requested Prescriptions  ?Pending Prescriptions Disp Refills  ? LANTUS SOLOSTAR 100 UNIT/ML Solostar Pen [Pharmacy Med Name: LANTUS SOLOSTAR PEN INJ 3ML] 15 mL 3  ?  Sig: ADMINISTER 50 UNITS UNDER THE SKIN EVERY MORNING  ?  ? Endocrinology:  Diabetes - Insulins Failed - 12/02/2021 12:45 PM  ?  ?  Failed - HBA1C is between 0 and 7.9 and within 180 days  ?  HB A1C (BAYER DCA - WAIVED)  ?Date Value Ref Range Status  ?12/10/2020 11.8 (H) <7.0 % Final  ?  Comment:  ?                                        Diabetic Adult            <7.0 ?                                      Healthy Adult        4.3 - 5.7 ?                                                          (DCCT/NGSP) ?American Diabetes Association's Summary of Glycemic Recommendations ?for Adults with Diabetes: Hemoglobin A1c <7.0%. More stringent ?glycemic goals (A1c <6.0%) may further reduce complications at the ?cost of increased risk of hypoglycemia. ?  ?  ?  ?  ?  Failed - Valid encounter within last 6 months  ?  Recent Outpatient Visits   ? ?      ? 9 months ago Anxiety  ? [facility] Vigg, Avanti, MD  ? 11 months ago Diabetes mellitus without complication ([place])  ? [facility] Vigg, Avanti, MD  ? 1 year ago Dizziness  ? [place], DO  ? 1 year ago Syncope, unspecified syncope type  ? [place], DO  ? 1 year ago Left hip pain  ? [facility] [place], Henrine Screws T, NP  ? ?  ?  ?Future Appointments   ? ?        ? Tomorrow Charlynne Cousins, MD [facility]  ? ?  ? ?  ?  ?  ?  ?

## 2021-12-04 NOTE — Telephone Encounter (Signed)
Called pt and made follow up appointment for 12/05/2021 ?

## 2021-12-05 ENCOUNTER — Ambulatory Visit: Payer: Medicare Other | Admitting: Internal Medicine

## 2022-02-14 ENCOUNTER — Ambulatory Visit: Payer: Medicare Other

## 2022-04-07 ENCOUNTER — Telehealth: Payer: Self-pay | Admitting: Internal Medicine

## 2022-04-07 NOTE — Telephone Encounter (Signed)
Copied from [place] [id number]. Topic: Medicare AWV >> Apr 07, 2022 11:47 AM Josephina Gip wrote: Reason for CRM: N/A unable to leave a message for patient to call back and schedule the Medicare Annual Wellness Visit (AWV) virtually or by telephone.  Last AWV 02/08/20  Please schedule at anytime with CFP-Nurse Health Advisor.    Any questions, please call me at [phone]

## 2022-04-30 ENCOUNTER — Telehealth: Payer: Self-pay

## 2022-04-30 NOTE — Telephone Encounter (Signed)
Called patient and spoke to him briefly, he states that after he is done with his meeting he will call and set up an appt for a physical.

## 2023-11-17 ENCOUNTER — Other Ambulatory Visit: Payer: Self-pay | Admitting: Family Medicine

## 2023-11-17 DIAGNOSIS — R519 Headache, unspecified: Secondary | ICD-10-CM

## 2023-11-17 DIAGNOSIS — R42 Dizziness and giddiness: Secondary | ICD-10-CM

## 2023-11-19 ENCOUNTER — Ambulatory Visit
Admission: RE | Admit: 2023-11-19 | Discharge: 2023-11-19 | Disposition: A | Discharge status: Home / Self Care | Source: Ambulatory Visit | Attending: Family Medicine | Admitting: Family Medicine

## 2023-11-19 DIAGNOSIS — R42 Dizziness and giddiness: Secondary | ICD-10-CM | POA: Diagnosis present

## 2023-11-19 DIAGNOSIS — R519 Headache, unspecified: Secondary | ICD-10-CM | POA: Insufficient documentation

## 2024-03-07 ENCOUNTER — Other Ambulatory Visit: Payer: Self-pay | Admitting: Family Medicine

## 2024-03-07 DIAGNOSIS — R053 Chronic cough: Secondary | ICD-10-CM

## 2024-03-08 ENCOUNTER — Encounter: Payer: Self-pay | Admitting: Student

## 2024-03-08 ENCOUNTER — Ambulatory Visit
Admission: RE | Admit: 2024-03-08 | Discharge: 2024-03-08 | Disposition: A | Discharge status: Home / Self Care | Source: Ambulatory Visit | Attending: Family Medicine | Admitting: Family Medicine

## 2024-03-08 DIAGNOSIS — R053 Chronic cough: Secondary | ICD-10-CM | POA: Insufficient documentation
